# Patient Record
Sex: Male | Born: 1935 | Race: Black or African American | Hispanic: No | Smoking: Current some day smoker
Health system: Southern US, Community
[De-identification: ages and names within clinical notes are randomized; demographics above are authoritative.]

## PROBLEM LIST (undated history)

## (undated) DIAGNOSIS — D473 Essential (hemorrhagic) thrombocythemia: Secondary | ICD-10-CM

## (undated) DIAGNOSIS — K219 Gastro-esophageal reflux disease without esophagitis: Secondary | ICD-10-CM

## (undated) DIAGNOSIS — K469 Unspecified abdominal hernia without obstruction or gangrene: Secondary | ICD-10-CM

## (undated) DIAGNOSIS — E039 Hypothyroidism, unspecified: Secondary | ICD-10-CM

## (undated) DIAGNOSIS — C61 Malignant neoplasm of prostate: Secondary | ICD-10-CM

## (undated) DIAGNOSIS — E785 Hyperlipidemia, unspecified: Secondary | ICD-10-CM

## (undated) DIAGNOSIS — I10 Essential (primary) hypertension: Secondary | ICD-10-CM

## (undated) DIAGNOSIS — M549 Dorsalgia, unspecified: Secondary | ICD-10-CM

## (undated) HISTORY — DX: Hyperlipidemia, unspecified: E78.5

## (undated) HISTORY — DX: Unspecified abdominal hernia without obstruction or gangrene: K46.9

## (undated) HISTORY — DX: Gastro-esophageal reflux disease without esophagitis: K21.9

## (undated) HISTORY — PX: PROSTATE SURGERY: SHX751

## (undated) HISTORY — DX: Essential (primary) hypertension: I10

## (undated) HISTORY — DX: Dorsalgia, unspecified: M54.9

## (undated) HISTORY — PX: OTHER SURGICAL HISTORY: SHX169

## (undated) HISTORY — DX: Malignant neoplasm of prostate: C61

## (undated) HISTORY — DX: Essential (hemorrhagic) thrombocythemia: D47.3

## (undated) HISTORY — PX: BACK SURGERY: SHX140

## (undated) HISTORY — PX: HERNIA REPAIR: SHX51

## (undated) HISTORY — DX: Hypothyroidism, unspecified: E03.9

## (undated) HISTORY — PX: SHOULDER SURGERY: SHX246

## (undated) NOTE — *Deleted (*Deleted)
[facility]   Telephone:([phone] Fax:([phone]) [phone]   Clinic Follow up Note   Patient Care Team: Georgann Housekeeper, MD as PCP - General (Internal Medicine) Exie Parody, MD (Hematology and Oncology) Charolett Bumpers, MD as Attending Physician (Gastroenterology) Barron Alvine, MD as Attending Physician (Urology)  Date of Service:  04/17/2020  CHIEF COMPLAINT: Follow up on essential thrombocytosis   PREVIOUS THERAPY:Hydrea 500 mg daily, from 08/2012 to 04/01/2015, stopped on 04/02/2015 due to recurrent episodes of dizziness, restarted on 06/08/2015. He tried anagrelide 0.5mg  bid for one month in 05/2015 which was not effective. Startled Hydrea 500 mg Monday-Saturday, hold on sundays due to anemia on 07/19/17, and decreased to 500mg  daily except Wednesday and Sunday on 12/06/2017, increased back to 500mg  daily except on Sundays in 01/2018. Dose further increased subsequently  CURRENT THERAPY: Increased hydrea to 1000 mg MWF and 500 mg other days due to plt count >500kin July 2020  INTERVAL HISTORY: *** Ricky Santos is here for a follow up of ET. He was last seen by me 6 months ago. He presents to the clinic alone.    REVIEW OF SYSTEMS:  *** Constitutional: Denies fevers, chills or abnormal weight loss Eyes: Denies blurriness of vision Ears, nose, mouth, throat, and face: Denies mucositis or sore throat Respiratory: Denies cough, dyspnea or wheezes Cardiovascular: Denies palpitation, chest discomfort or lower extremity swelling Gastrointestinal:  Denies nausea, heartburn or change in bowel habits Skin: Denies abnormal skin rashes Lymphatics: Denies new lymphadenopathy or easy bruising Neurological:Denies numbness, tingling or new weaknesses Behavioral/Psych: Mood is stable, no new changes  All other systems were reviewed with the patient and are negative.  MEDICAL HISTORY:  Past Medical History:  Diagnosis Date  . Back pain   . Essential thrombocytosis (HCC)  07/2012   JAK-2 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   . Essential thrombocytosis (HCC) 08/26/2012  . GERD (gastroesophageal reflux disease)   . Hernia   . Hyperlipidemia   . Hypertension   . Hypothyroid   . Prostate cancer [facility])     SURGICAL HISTORY: Past Surgical History:  Procedure Laterality Date  . BACK SURGERY    . COLONOSCOPY  2003  . COLONOSCOPY N/A 01/17/2013   Procedure: COLONOSCOPY;  Surgeon: Charolett Bumpers, MD;  Location: [facility];  Service: Endoscopy;  Laterality: N/A;  . exp lap for trauma    . HERNIA REPAIR    . INCISION AND DRAINAGE ABSCESS ANAL  08/01/05  . PROSTATE SURGERY    . SHOULDER SURGERY     rt    I have reviewed the social history and family history with the patient and they are unchanged from previous note.  ALLERGIES:  has No Known Allergies.  MEDICATIONS:  Current Outpatient Medications  Medication Sig Dispense Refill  . aspirin 81 MG tablet Take 81 mg by mouth daily.      Marland Kitchen atorvastatin (LIPITOR) 40 MG tablet Take 40 mg by mouth daily.      Marland Kitchen diltiazem (CARDIZEM CD) 240 MG 24 hr capsule Take 240 mg by mouth daily.      . finasteride (PROSCAR) 5 MG tablet Take 5 mg by mouth daily.      . hydrochlorothiazide 25 MG tablet Take 25 mg by mouth daily.      . hydroxyurea (HYDREA) 500 MG capsule Take 1 capsule (500 mg total) by mouth daily. Take 2 capsules daily on Mon, Wed, Fri, and 1 capsule daily for the rest of week 130 capsule 1  .  levothyroxine (SYNTHROID, LEVOTHROID) 25 MCG tablet Take 25 mcg by mouth daily.      Marland Kitchen lisinopril (PRINIVIL,ZESTRIL) 20 MG tablet Take 20 mg by mouth daily.  6  . meclizine (ANTIVERT) 25 MG tablet Take 25 mg by mouth as needed. Reported on 11/11/2015  3  . mirtazapine (REMERON) 15 MG tablet Take 15 mg by mouth at bedtime.     . pantoprazole (PROTONIX) 40 MG tablet Take 40 mg by mouth daily.      . psyllium (METAMUCIL) 58.6 % powder Take 1 packet by mouth 3 (three) times daily. Reported on 11/11/2015     No  current facility-administered medications for this visit.    PHYSICAL EXAMINATION: ECOG PERFORMANCE STATUS: {CHL ONC ECOG [phone]}  There were no vitals filed for this visit. There were no vitals filed for this visit. *** GENERAL:alert, no distress and comfortable SKIN: skin color, texture, turgor are normal, no rashes or significant lesions EYES: normal, Conjunctiva are pink and non-injected, sclera clear {OROPHARYNX:no exudate, no erythema and lips, buccal mucosa, and tongue normal}  NECK: supple, thyroid normal size, non-tender, without nodularity LYMPH:  no palpable lymphadenopathy in the cervical, axillary {or inguinal} LUNGS: clear to auscultation and percussion with normal breathing effort HEART: regular rate & rhythm and no murmurs and no lower extremity edema ABDOMEN:abdomen soft, non-tender and normal bowel sounds Musculoskeletal:no cyanosis of digits and no clubbing  NEURO: alert & oriented x 3 with fluent speech, no focal motor/sensory deficits  LABORATORY DATA:  I have reviewed the data as listed CBC Latest Ref Rng & Units 01/18/2020 10/18/2019 07/21/2019  WBC 4.0 - 10.5 K/uL 5.5 5.4 5.8  Hemoglobin 13.0 - 17.0 g/dL 16.1 09.6 04.5  Hematocrit 39 - 52 % 42.7 39.3 41.4  Platelets 150 - 400 K/uL 411(H) 431(H) 465(H)     CMP Latest Ref Rng & Units 01/18/2020 10/18/2019 07/21/2019  Glucose 70 - 99 mg/dL 409(W) 98 84  BUN 8 - 23 mg/dL 13 14 15   Creatinine 0.61 - 1.24 mg/dL 1.19(J) 4.78(G) 9.56  Sodium 135 - 145 mmol/L 139 136 138  Potassium 3.5 - 5.1 mmol/L 4.1 4.1 3.8  Chloride 98 - 111 mmol/L 103 103 103  CO2 22 - 32 mmol/L 24 22 25   Calcium 8.9 - 10.3 mg/dL 10.5(H) 9.6 9.9  Total Protein 6.5 - 8.1 g/dL 7.5 7.4 7.9  Total Bilirubin 0.3 - 1.2 mg/dL 0.5 0.6 0.5  Alkaline Phos 38 - 126 U/L 70 60 69  AST 15 - 41 U/L 25 23 20   ALT 0 - 44 U/L 13 10 11       RADIOGRAPHIC STUDIES: I have personally reviewed the radiological images as listed and agreed with the  findings in the report. No results found.   ASSESSMENT & PLAN:  Ricky Santos is a 60 y.o. male with   1. Essential Thrombocytosis. JAK2(+) -He was diagnosed in March 2014, initial platelet count in the 500k range, JAK2 mutation (+), he did not have bone marrow biopsy.  -We previously discussed that this is a myeloproliferative disorder, people usually do very well, and there is small percentage pts will develop myelofibrosis and AML late on. -He is currently on Hydrea to 1000 mg MWF and 500 mg other days. He is tolerating well.  -He is clinically doing well. Labs reviewed, CBC and CMP WNL except plt 431K, Cr 1.31. Iron panel still pending.  -Continue Hydrea at same dose.  -will repeat lab in 3 months and f/u in 6 months  2. Anemia of iron deficiency  -Etiology of iron deficiency remains unclear.HisEGD andcolonoscopy with Dr. Curt Jews 03/2018 were benign -Continue oral ferrous sulfate once daily  -anemiaand iron panel resolved in 07/2019. Today's panel still pending.   3. MGUS -M spike 0.2 with IgG monoclonal protein with lambda light chain specificity on 12/06/17.  -Cr on 10/11/17 was elevated to 1.34, normal previously; hasnormalizedon 02/14/18 -01/24/18 bone survey is negative for lytic lesions -Last M-Protein in6/2020 was not observed.WillcontinuemonitoringMM panel every 6 months.  4. Intermittent dizziness -unlikely related to hydrea -He previously participated a course of inner ear balance PT without any success. He will follow-up with Dr. Donette Larry -Stable with no incident of falls.  -Has resolved now.    Plan -He is clinically doing well and stable  -Continue Hydrea to1000mg  dailyon Mondays and 500mg daily forthe rest of the week, refilled today -Lab in3 months -Lab and f/u in25months   No problem-specific Assessment & Plan notes found for this encounter.   No orders of the defined types were placed in this encounter.  All questions were  answered. The patient knows to call the clinic with any problems, questions or concerns. No barriers to learning was detected. The total time spent in the appointment was {CHL ONC TIME VISIT - NFAOZ:[id number]}.     Delphina Cahill 04/17/2020   Rogelia Rohrer, am acting as scribe for Malachy Mood, MD.   {Add scribe attestation statement}

---

## 1997-12-10 ENCOUNTER — Ambulatory Visit (HOSPITAL_COMMUNITY): Admission: RE | Admit: 1997-12-10 | Discharge: 1997-12-10 | Payer: Self-pay | Admitting: Neurosurgery

## 1997-12-24 ENCOUNTER — Ambulatory Visit (HOSPITAL_COMMUNITY): Admission: RE | Admit: 1997-12-24 | Discharge: 1997-12-24 | Payer: Self-pay | Admitting: Neurosurgery

## 1998-01-07 ENCOUNTER — Ambulatory Visit (HOSPITAL_COMMUNITY): Admission: RE | Admit: 1998-01-07 | Discharge: 1998-01-07 | Payer: Self-pay | Admitting: Neurosurgery

## 2001-04-26 ENCOUNTER — Encounter: Payer: Self-pay | Admitting: Internal Medicine

## 2001-04-26 ENCOUNTER — Encounter: Admission: RE | Admit: 2001-04-26 | Discharge: 2001-04-26 | Payer: Self-pay | Admitting: Internal Medicine

## 2001-06-01 HISTORY — PX: COLONOSCOPY: SHX174

## 2002-02-02 ENCOUNTER — Encounter: Admission: RE | Admit: 2002-02-02 | Discharge: 2002-02-02 | Payer: Self-pay | Admitting: Internal Medicine

## 2002-02-02 ENCOUNTER — Encounter: Payer: Self-pay | Admitting: Internal Medicine

## 2002-02-14 ENCOUNTER — Encounter: Payer: Self-pay | Admitting: Internal Medicine

## 2002-02-14 ENCOUNTER — Encounter (INDEPENDENT_AMBULATORY_CARE_PROVIDER_SITE_OTHER): Payer: Self-pay | Admitting: *Deleted

## 2002-02-14 ENCOUNTER — Ambulatory Visit (HOSPITAL_COMMUNITY): Admission: RE | Admit: 2002-02-14 | Discharge: 2002-02-14 | Payer: Self-pay | Admitting: Internal Medicine

## 2002-07-31 ENCOUNTER — Ambulatory Visit (HOSPITAL_COMMUNITY): Admission: RE | Admit: 2002-07-31 | Discharge: 2002-07-31 | Payer: Self-pay | Admitting: Gastroenterology

## 2003-06-23 ENCOUNTER — Encounter: Admission: RE | Admit: 2003-06-23 | Discharge: 2003-06-23 | Payer: Self-pay | Admitting: Internal Medicine

## 2005-02-13 ENCOUNTER — Emergency Department (HOSPITAL_COMMUNITY): Admission: EM | Admit: 2005-02-13 | Discharge: 2005-02-13 | Payer: Self-pay | Admitting: Emergency Medicine

## 2005-06-08 ENCOUNTER — Ambulatory Visit (HOSPITAL_BASED_OUTPATIENT_CLINIC_OR_DEPARTMENT_OTHER): Admission: RE | Admit: 2005-06-08 | Discharge: 2005-06-08 | Payer: Self-pay | Admitting: Orthopedic Surgery

## 2005-08-01 ENCOUNTER — Emergency Department (HOSPITAL_COMMUNITY): Admission: EM | Admit: 2005-08-01 | Discharge: 2005-08-01 | Payer: Self-pay | Admitting: Emergency Medicine

## 2005-08-01 HISTORY — PX: INCISION AND DRAINAGE ABSCESS ANAL: SUR669

## 2005-10-19 ENCOUNTER — Encounter: Admission: RE | Admit: 2005-10-19 | Discharge: 2005-10-19 | Payer: Self-pay | Admitting: Internal Medicine

## 2006-08-13 ENCOUNTER — Encounter: Admission: RE | Admit: 2006-08-13 | Discharge: 2006-08-13 | Payer: Self-pay | Admitting: Urology

## 2006-08-16 ENCOUNTER — Ambulatory Visit (HOSPITAL_BASED_OUTPATIENT_CLINIC_OR_DEPARTMENT_OTHER): Admission: RE | Admit: 2006-08-16 | Discharge: 2006-08-16 | Payer: Self-pay | Admitting: Urology

## 2006-08-16 ENCOUNTER — Encounter (INDEPENDENT_AMBULATORY_CARE_PROVIDER_SITE_OTHER): Payer: Self-pay | Admitting: *Deleted

## 2006-09-19 ENCOUNTER — Emergency Department (HOSPITAL_COMMUNITY): Admission: EM | Admit: 2006-09-19 | Discharge: 2006-09-19 | Payer: Self-pay | Admitting: *Deleted

## 2006-10-26 ENCOUNTER — Encounter: Admission: RE | Admit: 2006-10-26 | Discharge: 2006-10-26 | Payer: Self-pay | Admitting: Internal Medicine

## 2007-02-18 ENCOUNTER — Emergency Department (HOSPITAL_COMMUNITY): Admission: EM | Admit: 2007-02-18 | Discharge: 2007-02-18 | Payer: Self-pay | Admitting: Emergency Medicine

## 2007-11-02 ENCOUNTER — Encounter: Admission: RE | Admit: 2007-11-02 | Discharge: 2007-11-02 | Payer: Self-pay | Admitting: Internal Medicine

## 2008-11-27 ENCOUNTER — Encounter: Admission: RE | Admit: 2008-11-27 | Discharge: 2008-11-27 | Payer: Self-pay | Admitting: Gastroenterology

## 2008-11-29 ENCOUNTER — Emergency Department (HOSPITAL_COMMUNITY): Admission: EM | Admit: 2008-11-29 | Discharge: 2008-11-29 | Payer: Self-pay | Admitting: Emergency Medicine

## 2008-12-01 ENCOUNTER — Emergency Department (HOSPITAL_COMMUNITY): Admission: EM | Admit: 2008-12-01 | Discharge: 2008-12-01 | Payer: Self-pay | Admitting: Emergency Medicine

## 2008-12-03 ENCOUNTER — Emergency Department (HOSPITAL_COMMUNITY): Admission: EM | Admit: 2008-12-03 | Discharge: 2008-12-03 | Payer: Self-pay | Admitting: Emergency Medicine

## 2010-06-22 ENCOUNTER — Encounter: Payer: Self-pay | Admitting: Internal Medicine

## 2010-10-17 NOTE — Op Note (Signed)
NAMEDEMARIO, Ricky Santos NO.:  [id number]   MEDICAL RECORD NO.:  [id number]          PATIENT TYPE:  EMS   LOCATION:  ED                           FACILITY:  [facility]   PHYSICIAN:  Sandria Bales. Ezzard Standing, M.D.  DATE OF BIRTH:  01-Sep-1933   DATE OF PROCEDURE:  02/13/2005  DATE OF DISCHARGE:                                 OPERATIVE REPORT   PREOPERATIVE DIAGNOSIS:  Right perianal abscess.   POSTOPERATIVE DIAGNOSIS:  Right perianal abscess.   PROCEDURE:  Incision and drainage of perianal abscess.   ANESTHESIA:  Approximately 15 mL of 1% Xylocaine.   COMPLICATIONS:  None.   INDICATIONS FOR PROCEDURE:  Mr. Bostock is a 77 year old black gentleman who  first came to Dr. Earl Lites at [facility] and then presented to  [facility] with a right perianal abscess which has been  brewing for about a week.  I planned to incise and drain this in the  emergency room.   OPERATIVE NOTE:  The patient is in a right lateral decubitus position.  His  right buttocks was prepped with Betadine solution and sterilely draped.  I  infiltrated the skin with approximately 15 mL of 0.25% Xylocaine.  I then  made a linear incision into an abscess.  I made the incision about 3 cm  long, cut out about a 2-3 cm core of skin to better let this drain.  I then  packed it with gauze, sterilely dressed it.   I gave him Vicodin for pain, Septra DS one week for antibiotic.  He will be  seen back in 7-10 days for follow up.      Sandria Bales. Ezzard Standing, M.D.  Electronically Signed     DHN/MEDQ  D:  02/13/2005  T:  02/13/2005  Job:  [id number]   cc:   Georgann Housekeeper, MD  [address]  [place]  [place] [address]  Fax: [phone]   Stan Head. Cleta Alberts, M.D.  [address]  [place]  [place] [address]  Fax: [phone]

## 2010-10-17 NOTE — Op Note (Signed)
NAME:  Ricky Santos, Ricky Santos NO.:  [id number]   MEDICAL RECORD NO.:  [id number]          PATIENT TYPE:  AMB   LOCATION:  [facility]                         FACILITY:  [facility]   PHYSICIAN:  Valetta Fuller, M.D.  DATE OF BIRTH:  10-03-1933   DATE OF PROCEDURE:  08/16/2006  DATE OF DISCHARGE:                               OPERATIVE REPORT   PREOPERATIVE DIAGNOSES:  1. Elevated PSA.  2. Bladder neck obstruction and BPH.   POSTOPERATIVE DIAGNOSES:  1. Elevated PSA.  2. Bladder neck obstruction and BPH.   PROCEDURES PERFORMED:  1. Transrectal ultrasound of the prostate with ultrasound-guided      biopsy.  2. Transurethral incision of the prostate.   SURGEON:  Valetta Fuller, M.D.   ANESTHESIA:  General.   INDICATIONS:  The patient is a 77 year old male.  He has been seen in  our office for longstanding bladder neck obstruction and BPH.  He was on  alpha blocker therapy for the last 3-4 years, and he had an immediate  good response to that.  Over time it has lost the effectiveness, and he  has become more and more symptomatic with regard to his bladder neck  obstruction.  The patient has had progressive problems with nocturia,  with increased hesitancy, a weak and intermittent stream. Approximately  one year ago the patient's PSA was normal at 2.9.  The last time he was  in the office his urine was clear without evidence of infection.  Cystoscopy showed mild lateral lobe hyperplasia with a fairly prominent  and high-riding median bar.  Peak flow was 4 mL per second, with a mean  flow of only 3 mL per second.  We talked to the patient about other  options, and he elected to undergo a transurethral incision of the  prostate.  He appeared to understand the advantages and disadvantages of  that.  As part of his assessment, we also repeated his PSA -- which was  elevated at 12.  Taking that he might have some mild chronic smoldering  prostatitis, he was treated with an empiric  course of antibiotics, and  his PSA was repeated recently but was still significantly elevated at  10.  For that reason we felt it would be reasonable to go ahead and do a  prostate biopsy at the same time we were doing the transurethral  incision.  We did not feel that the transurethral incision of the  prostate would in anyway impact necessarily any prostate cancer  treatment that might be necessary down the road, and may actually  facilitate treatment if it is necessary.  The patient appeared to  understand the advantages and disadvantages of all these procedures.  An  informed consent was obtained.   TECHNIQUES AND FINDINGSTHE:  The patient was brought to the operating  room, where he had successful induction of general anesthesia.  He was  placed in the lithotomy position and prepped and draped in the usual  manner.  Attention was first turned towards ultrasound and biopsy of his  prostate.  Transrectal ultrasound was done and  representative sagittal  transverse images were taken and saved.  Prostate appeared relatively  symmetric with unremarkable seminal vesicles.  Prostate volume was  estimated at 50 grams.  We ended up doing 6 biopsies of both the right  and left lobes of the prostate, and these biopsies were sent separately.   Attention was then turned towards transurethral incision of the  prostate.  We used [employer] sounds to 30-French, and then placed a 28-  [place] continuous resectoscope sheath in the bladder.  The bladder  itself showed no other pathology, and had been inspected carefully with  flexible cystoscopy.  He did have some mild thickening and trabecular  change.  Again, there were some lateral lobes, but primarily a high-  riding median bar.  Utilizing a Collings knife, we performed a  transurethral incision of the prostate from the trigone between the  ureteral orifices, back through the bladder neck and median bar of the  prostate, to the level of the  verumontanum.  This resulted in extremely  good opening and improvement in visual obstruction.  Hemostasis was  quite good.  We then used some lidocaine jelly, and placed a 22-French  Foley catheter with a 30 mL balloon.  Urine remained clear.  The patient  appeared to tolerate the procedure well without difficulties or  problems.  He was brought to the recovery room in stable condition.           ______________________________  Valetta Fuller, M.D.  Electronically Signed     DSG/MEDQ  D:  08/16/2006  T:  08/16/2006  Job:  [id number]

## 2010-10-17 NOTE — Consult Note (Signed)
NAMEXHAIDEN, COOMBS NO.:  [id number]   MEDICAL RECORD NO.:  [id number]          PATIENT TYPE:  EMS   LOCATION:  ED                           FACILITY:  [facility]   PHYSICIAN:  Ricky Santos, M.D.  DATE OF BIRTH:  Mar 10, 1934   DATE OF CONSULTATION:  02/13/2005  DATE OF DISCHARGE:                                   CONSULTATION   HISTORY OF ILLNESS:  [name] is a 77 year old black gentleman, who is a  patient of Dr. Georgann Santos, who has developed increasing right  perirectal/perianal pain.  This has been going on over about a week.  He  thought this would take care of itself and drain at home, but it got bad  after [facility], though his primary medical  doctor is Dr. Georgann Santos.   PAST MEDICAL HISTORY:  He has no allergies.   He is on no medication.   REVIEW OF SYSTEMS:  He has no history of pulmonary disease, coronary artery  disease, gastrointestinal disease, or urologic disease.   He is retired, now works as a Agricultural consultant for [employer], etc.   His wife is in the emergency room with him.   PHYSICAL EXAMINATION:  VITAL SIGNS:  His temperature is 98.7; pulse is 90;  respirations are 20.  GENERAL:  He is a well-nourished, average-build black male.  NECK:  His neck is supple.  I find no mass or thyromegaly.  LUNGS:  Clear to auscultation.  HEART:  Regular rate and rhythm without murmur or rub.  ABDOMEN:  Soft.   In the right lateral decubitus position, he has a bulge and swelling of his  right buttocks, sort of posterior to the anus, consistent with a perianal  abscess.   DIAGNOSIS:  Right perianal abscess.  We will plan incision and drainage in  the emergency room under local anesthesia.  He is otherwise a healthy male.  I will give him some Vicodin for pain to go home with.  I will give him a 1  week supply of Septra DS to take after this, and he will see me back in 10-  14 days for  follow up.      Ricky Santos, M.D.  Electronically Signed     DHN/MEDQ  D:  02/13/2005  T:  02/13/2005  Job:  [id number]   cc:   Ricky Housekeeper, MD  [address]  [place]  [place] [address]  Fax: [phone]   Ricky Santos. Ricky Santos, M.D.  [address]  [place]  [place] [address]  Fax: [phone]

## 2010-10-17 NOTE — Op Note (Signed)
   NAME:  Ricky Santos, Ricky Santos NO.:  [id number]   MEDICAL RECORD NO.:  [id number]                   PATIENT TYPE:  AMB   LOCATION:  ENDO                                 FACILITY:  [facility]   PHYSICIAN:  Danise Edge, M.D.                DATE OF BIRTH:  1933-10-09   DATE OF PROCEDURE:  07/31/2002  DATE OF DISCHARGE:                                 OPERATIVE REPORT   PROCEDURE:  Screening colonoscopy.   INDICATIONS:  The patient is a 77 year old male born [birth year].  The  patient has colonic diverticulosis, by barium enema x-ray a few years ago,  and also has chronic constipation.  He is scheduled to undergo his first  screening colonoscopy with polypectomy to prevent colon cancer.   ENDOSCOPIST:  Charolett Bumpers, M.D.   PREMEDICATION:  1. Demerol 50 mg.  2. Versed 5 mg.   ENDOSCOPE:  Olympus adult colonoscope.   DESCRIPTION OF PROCEDURE:  After obtaining informed consent, the patient was  placed in the left lateral decubitus position.  I administered intravenous  Demerol and intravenous Versed to achieve conscious sedation for the  procedure.  The patient's blood pressure, oxygen saturation and cardiac  rhythm were monitored throughout the procedure and documented in the medical  record.   Anal inspection was normal.  Digital rectal examination was normal.  The  Olympus adult video colonoscope was introduced into the rectum and easily  advanced to the cecum.  Colonic preparation for the examination today was  excellent.   FINDINGS:  RECTUM:  Normal.  SIGMOID/DESCENDING COLON:  Small diverticula are noted in the left colon,  without diverticulitis or diverticular structural formation.  SPLENIC FLEXURE:  Normal.  TRANSVERSE COLON:  Normal.  HEPATIC FLEXURE:  Normal.  ASCENDING:  Normal.  CECUM AND ILEOCECAL VALVE:  Normal.    ASSESSMENT:  1. Left colonic diverticulosis; otherwise normal proctocolonoscopy to the     cecum.  2. No endoscopic  evidence for the presence of colorectal neoplasia.                                               Danise Edge, M.D.    MJ/MEDQ  D:  07/31/2002  T:  07/31/2002  Job:  [id number]   cc:   Georgann Housekeeper, M.D.  [address]  [place]  [place] [address]  Fax: [phone]

## 2010-10-17 NOTE — Op Note (Signed)
NAMEDEMONIE, Ricky Santos NO.:  [id number]   MEDICAL RECORD NO.:  [id number]          PATIENT TYPE:  AMB   LOCATION:  DSC                          FACILITY:  [facility]   PHYSICIAN:  Feliberto Gottron. Turner Daniels, M.D.   DATE OF BIRTH:  [birth year]   DATE OF PROCEDURE:  06/08/2005  DATE OF DISCHARGE:  02/13/2005                                 OPERATIVE REPORT   PREOPERATIVE DIAGNOSIS:  Left shoulder impingement syndrome with probable  rotator cuff tear.   POSTOPERATIVE DIAGNOSIS:  Left shoulder impingement syndrome with probable  rotator cuff tear, biceps tendon tear.   PROCEDURE:  Left shoulder arthroscopic anterior-inferior acromioplasty,  debridement of massive supraspinatus rotator cuff tear and debridement of  remnants of  biceps tendon.   SURGEON:  Feliberto Gottron. Turner Daniels, MD   FIRST ASSISTANT:  Skip Mayer, PA-C.   ANESTHETIC:  Left interscalene block plus general endotracheal.   ESTIMATED BLOOD LOSS:  Minimal.   FLUID REPLACEMENT:  800 mL crystalloid.   DRAINS PLACED:  None.   TOURNIQUET TIME:  None.   INDICATIONS FOR PROCEDURE:  The patient is a 77 year old gentleman who has  been treated appropriately and conservatively for bilateral impingement  syndrome with anti-inflammatory medicines, observation, Thera-Band exercises  and had good temporary relief from cortisone injection, actually got fairly  dramatic relief from a cortisone shot last year, that lasted for many many  months. Unfortunately, his symptoms returned.  He went bilateral cortisone  injections back in October.  The right one more the left unfortunately has  persistent significant pain in his left shoulder. Plain radiographs were  consistent with a decreased subacromial interval and the subacromial spur  and he is taken for arthroscopic decompression and debridement of presumed  rotator cuff tear and any other pathology that might be found. Risks,  benefits discussed prior to surgery questions answered.   DESCRIPTION OF PROCEDURE:  The patient identified by armband and underwent  interscalene block anesthesia with appropriate monitoring in the preop area  of the day surgery center. He was then taken back to the operative suite and  underwent general endotracheal anesthesia in the supine position, was then  placed in the beach chair position and the left upper extremity prepped and  draped in usual sterile fashion from the wrist of the hemithorax.  Using a  #11 blade, standard portals were then made 1.5 cm anterior to the [facility] joint  lateral to the junction of middle and posterior thirds of the acromion,  posterior and posterolateral part of the acromion process. The inflow was  placed anteriorly, the arthroscope laterally, a 4.2 great white sucker  shaver posteriorly.  We immediately identified a large supraspinatus rotator  cuff tear that appeared to be chronic in nature as well as about a 95%  biceps tear that also appeared to be chronic in nature. These were both  debrided back to stable margins and the biceps tendon was noted to retract  after it had been sectioned.  The rotator cuff only got back to a stable  margin was at the glenoid rim along the supraspinatus.  We performed a  greater tuboplasty smoothed out the greater tuberosity and also resected a  sharp type 3 subacromial spur with a 4.5 mm hooded Vortex bur. The [facility] joint  was in relatively good condition. The leading and trailing edges of the  rotator cuff tear were smoothed back to an even margin.  The scope was taken  into the glenohumeral joint through the defect in the rotator cuff where the  labrum was noted to be intact and there was only a central grade 2 to grade  3 chondromalacia of the glenoid that was lightly debrided.  At this point  the shoulder was irrigated out with normal saline solution. The arthroscopic  instruments were removed.  A dressing of Xeroform, 4x4 dressing sponges,  paper tape and a sling applied. The  patient was then laid supine, awakened  and taken to the recovery room without difficulty.      Feliberto Gottron. Turner Daniels, M.D.  Electronically Signed     FJR/MEDQ  D:  06/08/2005  T:  06/09/2005  Job:  [id number]

## 2010-11-24 ENCOUNTER — Encounter (INDEPENDENT_AMBULATORY_CARE_PROVIDER_SITE_OTHER): Payer: Self-pay | Admitting: Surgery

## 2010-11-28 ENCOUNTER — Encounter (INDEPENDENT_AMBULATORY_CARE_PROVIDER_SITE_OTHER): Payer: Self-pay | Admitting: Surgery

## 2010-11-28 ENCOUNTER — Ambulatory Visit (INDEPENDENT_AMBULATORY_CARE_PROVIDER_SITE_OTHER): Payer: Medicare Other | Admitting: Surgery

## 2010-11-28 VITALS — BP 130/82 | HR 77 | Temp 98.2°F | Ht 69.0 in | Wt 140.0 lb

## 2010-11-28 DIAGNOSIS — K4090 Unilateral inguinal hernia, without obstruction or gangrene, not specified as recurrent: Secondary | ICD-10-CM

## 2010-11-28 NOTE — Progress Notes (Signed)
Subjective:     Patient ID: Ricky Santos, male   DOB: [birth year], 77 y.o.   MRN: [id number]    BP 130/82  Pulse 77  Temp(Src) 98.2 F (36.8 C) (Temporal)  Ht 5\' 9"  (1.753 m)  Wt 140 lb (63.504 kg)  BMI 20.67 kg/m2    HPI This is a 78 year old gentleman referred by Dr. Eula Listen for a bulge in his right groin. He noticed this approximately 10 days ago. It causes him minimal discomfort. It easily reduces. He does do a lot of heavy lifting. He has had no obstructive symptoms.  Review of Systems  Constitutional: Negative.   HENT: Negative.   Eyes: Negative.   Respiratory: Negative.   Cardiovascular: Negative.   Gastrointestinal: Negative.   Genitourinary: Negative.   Musculoskeletal: Negative.   Neurological: Negative.   Hematological: Negative.   Psychiatric/Behavioral: Negative.        Objective:   Physical Exam  Constitutional: He is oriented to person, place, and time. He appears well-developed and well-nourished.  HENT:  Head: Normocephalic and atraumatic.  Nose: Nose normal.  Mouth/Throat: Oropharynx is clear and moist.  Eyes: Conjunctivae are normal. Pupils are equal, round, and reactive to light.  Neck: Neck supple. No tracheal deviation present. Thyromegaly present.  Cardiovascular: Normal rate, regular rhythm and normal heart sounds.   No murmur heard. Pulmonary/Chest: Effort normal and breath sounds normal. No respiratory distress.  Abdominal: Soft. Bowel sounds are normal. He exhibits no distension. There is no tenderness. There is no rebound. A hernia is present. Hernia confirmed positive in the right inguinal area.  Musculoskeletal: Normal range of motion. He exhibits no edema and no tenderness.  Neurological: He is alert and oriented to person, place, and time.  Skin: Skin is warm and dry.       Assessment:    Right Inguinal Hernia    Plan:     I recommend right inguinal hernia repair with mesh. I discussed the risk of the surgery with him in detail.  These risks include but are not limited to bleeding, infection, injury to surrounding structures, nerve entrapment, chronic pain, recurrence, use of mesh. He understands and wishes to proceed. Surgery will be scheduled.

## 2010-12-05 ENCOUNTER — Encounter (HOSPITAL_BASED_OUTPATIENT_CLINIC_OR_DEPARTMENT_OTHER)
Admission: RE | Admit: 2010-12-05 | Discharge: 2010-12-05 | Disposition: A | Discharge status: Home / Self Care | Payer: Medicare Other | Source: Ambulatory Visit | Attending: Surgery | Admitting: Surgery

## 2010-12-05 ENCOUNTER — Other Ambulatory Visit (INDEPENDENT_AMBULATORY_CARE_PROVIDER_SITE_OTHER): Payer: Self-pay | Admitting: Surgery

## 2010-12-05 ENCOUNTER — Ambulatory Visit
Admission: RE | Admit: 2010-12-05 | Discharge: 2010-12-05 | Disposition: A | Discharge status: Home / Self Care | Payer: Medicare Other | Source: Ambulatory Visit | Attending: Surgery | Admitting: Surgery

## 2010-12-05 DIAGNOSIS — Z01811 Encounter for preprocedural respiratory examination: Secondary | ICD-10-CM

## 2010-12-05 LAB — POCT I-STAT, CHEM 8
Calcium, Ion: 1.1 mmol/L — ABNORMAL LOW (ref 1.12–1.32)
Chloride: 107 mEq/L (ref 96–112)
Creatinine, Ser: 0.8 mg/dL (ref 0.50–1.35)
Glucose, Bld: 110 mg/dL — ABNORMAL HIGH (ref 70–99)
HCT: 43 % (ref 39.0–52.0)

## 2010-12-08 ENCOUNTER — Ambulatory Visit (HOSPITAL_BASED_OUTPATIENT_CLINIC_OR_DEPARTMENT_OTHER)
Admission: RE | Admit: 2010-12-08 | Discharge: 2010-12-08 | Disposition: A | Discharge status: Home / Self Care | Payer: Medicare Other | Source: Ambulatory Visit | Attending: Surgery | Admitting: Surgery

## 2010-12-08 DIAGNOSIS — K4090 Unilateral inguinal hernia, without obstruction or gangrene, not specified as recurrent: Secondary | ICD-10-CM

## 2010-12-08 DIAGNOSIS — Z01810 Encounter for preprocedural cardiovascular examination: Secondary | ICD-10-CM | POA: Insufficient documentation

## 2010-12-08 DIAGNOSIS — Z79899 Other long term (current) drug therapy: Secondary | ICD-10-CM | POA: Insufficient documentation

## 2010-12-08 DIAGNOSIS — K219 Gastro-esophageal reflux disease without esophagitis: Secondary | ICD-10-CM | POA: Insufficient documentation

## 2010-12-08 DIAGNOSIS — Z01812 Encounter for preprocedural laboratory examination: Secondary | ICD-10-CM | POA: Insufficient documentation

## 2010-12-08 DIAGNOSIS — I10 Essential (primary) hypertension: Secondary | ICD-10-CM | POA: Insufficient documentation

## 2010-12-11 NOTE — Op Note (Signed)
  NAME:  FITZ, MATSUO NO.:  [id number]  MEDICAL RECORD NO.:  [id number]  LOCATION:                                 FACILITY:  PHYSICIAN:  Abigail Miyamoto, M.D.      DATE OF BIRTH:  DATE OF PROCEDURE:  12/08/2010 DATE OF DISCHARGE:                              OPERATIVE REPORT   PREOPERATIVE DIAGNOSIS:  Right inguinal hernia.  POSTOPERATIVE DIAGNOSIS:  Right inguinal hernia.  PROCEDURE:  Right inguinal hernia repair with mesh.  SURGEON:  Abigail Miyamoto, MD  ANESTHESIA:  General Marcaine block and ropivacaine block, and 1% lidocaine.  ESTIMATED BLOOD LOSS:  Minimal.  FINDINGS:  Indirect inguinal hernia.  PROCEDURE IN DETAIL:  The patient was identified as Ricky Santos, under ultrasound guidance anesthesia performed.  An ilioinguinal nerve block with ropivacaine.  The patient was then brought in a stable condition to the operating room and general anesthesia was induced.  His right groin was then prepped and draped in usual sterile fashion.  The skin was then anesthetized with 1% lidocaine.  I made a longitudinal incision with a #10 blade, I took this down through Scarpa's fascia with electrocautery. The external oblique fascia was identified and then opened further through the internal and external rings.  Testicular cord structures were easily identified and controlled with the Penrose drain.  The patient had minimally weak inguinal floor.  He had a very small indirect inguinal hernia sac which I was able to separate from the cord structures.  I opened the sac and all contents had been reduced.  I then tied off the base of sac with 2-0 silk suture.  A piece of ProGrip Covidien Prolene mesh was then brought to the field.  I placed it around the cord structures and manipulated toward the pubis.  I then placed one 2-0 Vicryl suture in the mesh securing it to shelving edge of inguinal ligament.  A good coverage of the inguinal floor and good control of  the cord structures appeared to be achieved.  I then closed the external oblique over top of this with a running 2-0 Vicryl suture.  Scarpa's fascia was then closed with interrupted 3-0 Vicryl sutures and skin was closed with running 4-0 Monocryl.  Steri-Strips, gauze, and tape were then applied.  The patient tolerated the procedure well.  All sponge, needle, and instrument counts were correct at the end of the procedure.  The patient was then extubated in the operating room and taken in a stable condition to the recovery room.     Abigail Miyamoto, M.D.     DB/MEDQ  D:  12/08/2010  T:  12/09/2010  Job:  [id number]  Electronically Signed by Abigail Miyamoto M.D. on 12/11/2010 07:24:34 PM

## 2010-12-24 ENCOUNTER — Ambulatory Visit (INDEPENDENT_AMBULATORY_CARE_PROVIDER_SITE_OTHER): Payer: Medicare Other | Admitting: Surgery

## 2010-12-24 ENCOUNTER — Encounter (INDEPENDENT_AMBULATORY_CARE_PROVIDER_SITE_OTHER): Payer: Self-pay | Admitting: Surgery

## 2010-12-24 DIAGNOSIS — Z09 Encounter for follow-up examination after completed treatment for conditions other than malignant neoplasm: Secondary | ICD-10-CM

## 2010-12-24 NOTE — Progress Notes (Signed)
Subjective:     Patient ID: Ricky Santos, male   DOB: 1934/02/10, 77 y.o.   MRN: [id number]  HPI He is here for his postoperative visit status post right inguinal hernia repair with mesh. He has no complaints. His pain is well-controlled. Review of Systems     Objective:   Physical Exam On exam, his incision is healing well. There is only mild postoperative swelling.    Assessment:    Patient status post right inguinal hernia repair with mesh    Plan:     I told him to refrain from heavy lifting for one more week. He may return to his full activity on August 3. He will see me back as needed

## 2011-03-12 LAB — WOUND CULTURE

## 2011-05-06 ENCOUNTER — Ambulatory Visit (INDEPENDENT_AMBULATORY_CARE_PROVIDER_SITE_OTHER): Payer: Medicare Other

## 2011-05-06 DIAGNOSIS — L02619 Cutaneous abscess of unspecified foot: Secondary | ICD-10-CM

## 2011-05-06 DIAGNOSIS — M79609 Pain in unspecified limb: Secondary | ICD-10-CM

## 2011-12-24 ENCOUNTER — Ambulatory Visit (INDEPENDENT_AMBULATORY_CARE_PROVIDER_SITE_OTHER): Payer: Medicare Other | Admitting: Family Medicine

## 2011-12-24 VITALS — BP 130/80 | HR 84 | Temp 98.2°F | Resp 16 | Ht 67.5 in | Wt 141.4 lb

## 2011-12-24 DIAGNOSIS — IMO0001 Reserved for inherently not codable concepts without codable children: Secondary | ICD-10-CM

## 2011-12-24 DIAGNOSIS — M7918 Myalgia, other site: Secondary | ICD-10-CM

## 2011-12-24 DIAGNOSIS — L0231 Cutaneous abscess of buttock: Secondary | ICD-10-CM

## 2011-12-24 MED ORDER — SULFAMETHOXAZOLE-TRIMETHOPRIM 800-160 MG PO TABS: 1.0000 | ORAL_TABLET | Freq: Two times a day (BID) | ORAL | Status: DC

## 2011-12-24 MED ORDER — HYDROCODONE-ACETAMINOPHEN 5-325 MG PO TABS: 1.0000 | ORAL_TABLET | ORAL | Status: DC | PRN

## 2011-12-24 NOTE — Progress Notes (Signed)
Patient ID: [name] MRN: [id number], DOB: 19-Sep-1933, 78 y.o. Date of Encounter: 12/24/2011, 4:32 PM    PROCEDURE NOTE: Verbal consent obtained. Betadine prep per usual protocol. Local anesthesia obtained with 2% lidocaine plain.  1 cm incision made with 11 blade along lesion.  Culture taken. Moderate amount of purulence expressed. Lesion explored revealing no loculations. Dressed. Wound care instructions including precautions with patient. Patient tolerated the procedure well.     Grier Mitts, PA-C 12/24/2011 4:32 PM

## 2011-12-24 NOTE — Progress Notes (Signed)
Subjective: Painful swollen cystic area on his butt that has been bothering him for 5 days. He has a history of bursa almost causing him to lose a toe in the past. He has had other abscesses in the past but has been long time.  Objective: Right centimeter firm cystic area on the buttock left of the gluteal crease.  Assessment: Abscess of buttock, probably MRSA  Plan: I&D and culture and treat with antibiotics

## 2011-12-27 LAB — WOUND CULTURE

## 2011-12-29 ENCOUNTER — Ambulatory Visit (INDEPENDENT_AMBULATORY_CARE_PROVIDER_SITE_OTHER): Payer: Medicare Other | Admitting: Family Medicine

## 2011-12-29 VITALS — BP 128/72 | HR 82 | Temp 98.6°F | Resp 18 | Ht 67.0 in | Wt 135.0 lb

## 2011-12-29 DIAGNOSIS — L03317 Cellulitis of buttock: Secondary | ICD-10-CM

## 2011-12-29 DIAGNOSIS — L0231 Cutaneous abscess of buttock: Secondary | ICD-10-CM

## 2011-12-29 DIAGNOSIS — R52 Pain, unspecified: Secondary | ICD-10-CM

## 2011-12-29 MED ORDER — SULFAMETHOXAZOLE-TRIMETHOPRIM 800-160 MG PO TABS: 1.0000 | ORAL_TABLET | Freq: Two times a day (BID) | ORAL | Status: AC

## 2011-12-29 NOTE — Patient Instructions (Signed)
Continue antibiotics

## 2011-12-29 NOTE — Progress Notes (Signed)
Subjective: Patient is here for a reassessment of that lesion on his buttock. He's been soaking it several times a day. He's gotten a little bit of blood from it but not a lot of pus. He continues to persist as a big lump there. It is not quite as painful as it was.  Objective. 5 or 6 cm diameter area of induration of the left buttock, above the anus and lateral on the to the gluteal crease. I was able to express some small amounts of pus, which seem to be be milked out of different pockets. Did not get a large core out through the tiny pinhole of an opening. I feel this needs to be repeat I&D.  Culture was positive for MRSA.  Assessment: MRSA abscess of left buttock, not well responded yet.  Plan:  Repeat I&D Continue antibiotics

## 2011-12-29 NOTE — Progress Notes (Signed)
Patient ID: NAPOLEON MONACELLI MRN: [id number], DOB: 03/03/1934, 78 y.o. Date of Encounter: 12/29/2011, 3:53 PM    PROCEDURE NOTE: Verbal consent obtained. Betadine prep per usual protocol. Local anesthesia obtained with 2% lidocaine plain.  1 cm incision made with 11 blade along lesion.  Culture taken. Minimal purulence expressed. Lesion explored revealing at least 1 loculation that a minimal amount of purulence expressed from.  Wound extremely indurated.  Packed with 1/4 packing plain.  Dressed. Wound care instructions including precautions with patient. Patient tolerated the procedure well. Recheck in 48 hours.       Grier Mitts, PA-C 12/29/2011 3:53 PM

## 2011-12-31 ENCOUNTER — Ambulatory Visit (INDEPENDENT_AMBULATORY_CARE_PROVIDER_SITE_OTHER): Payer: Medicare Other | Admitting: Physician Assistant

## 2011-12-31 VITALS — BP 110/64 | HR 68 | Temp 98.4°F | Resp 16 | Ht 67.5 in | Wt 138.0 lb

## 2011-12-31 DIAGNOSIS — L02219 Cutaneous abscess of trunk, unspecified: Secondary | ICD-10-CM

## 2011-12-31 DIAGNOSIS — L02212 Cutaneous abscess of back [any part, except buttock]: Secondary | ICD-10-CM

## 2011-12-31 DIAGNOSIS — L03319 Cellulitis of trunk, unspecified: Secondary | ICD-10-CM

## 2011-12-31 NOTE — Progress Notes (Signed)
Patient ID: Ricky Santos MRN: [id number], DOB: [birth year] 78 y.o. Date of Encounter: 12/31/2011, 2:27 PM  Chief Complaint: Wound care   See previous note  HPI: 78 y.o. y/o male presents for wound care s/p I&D on 12/29/11. Doing well No issues or complaints Afebrile/ no chills No nausea or vomiting Tolerating Bactrim. Pain minimal. Daily dressing change Previous note reviewed  Past Medical History  Diagnosis Date  . Thyroid disease   . Hypertension   . GERD (gastroesophageal reflux disease)   . Cancer   . Back pain   . Hernia      Home Meds: Prior to Admission medications   Medication Sig Start Date End Date Taking? Authorizing Provider  aspirin 81 MG tablet Take 81 mg by mouth daily.     Yes Historical Provider, MD  atorvastatin (LIPITOR) 40 MG tablet Take 40 mg by mouth daily.     Yes Historical Provider, MD  diltiazem (CARDIZEM CD) 240 MG 24 hr capsule Take 240 mg by mouth daily.     Yes Historical Provider, MD  finasteride (PROSCAR) 5 MG tablet Take 5 mg by mouth daily.     Yes Historical Provider, MD  hydrochlorothiazide 25 MG tablet Take 25 mg by mouth daily.     Yes Historical Provider, MD  HYDROcodone-acetaminophen (NORCO/VICODIN) 5-325 MG per tablet Take 1 tablet by mouth every 4 (four) hours as needed for pain. 12/24/11  Yes Peyton Najjar, MD  levothyroxine (SYNTHROID, LEVOTHROID) 25 MCG tablet Take 25 mcg by mouth daily.     Yes Historical Provider, MD  pantoprazole (PROTONIX) 40 MG tablet Take 40 mg by mouth daily.     Yes Historical Provider, MD  psyllium (METAMUCIL) 58.6 % powder Take 1 packet by mouth 3 (three) times daily.     Yes Historical Provider, MD  sildenafil (VIAGRA) 50 MG tablet Take 50 mg by mouth daily as needed.     Yes Historical Provider, MD  sulfamethoxazole-trimethoprim (BACTRIM DS,SEPTRA DS) 800-160 MG per tablet Take 1 tablet by mouth 2 (two) times daily. 12/29/11 01/08/12 Yes Peyton Najjar, MD  Tamsulosin HCl (FLOMAX) 0.4 MG CAPS Take by mouth.      Yes Historical Provider, MD    Allergies: No Known Allergies  ROS: Constitutional: Afebrile, no chills Cardiovascular: negative for chest pain or palpitations Dermatological: Positive for wound. Negative for erythema, pain, or warmth. +induration. GI: No nausea or vomiting   EXAM: Physical Exam: Blood pressure 110/64, pulse 68, temperature 98.4 F (36.9 C), temperature source Oral, resp. rate 16, height 5' 7.5" (1.715 m), weight 138 lb (62.596 kg), SpO2 100.00%., Body mass index is 21.29 kg/(m^2). General: Well developed, well nourished, in no acute distress. Nontoxic appearing. Head: Normocephalic, atraumatic, sclera non-icteric.  Neck: Supple. Lungs: Breathing is unlabored. Heart: Normal rate. Skin:  Warm and moist. Dressing and packing in place. Minmal induration, erythema, or tenderness to palpation. Neuro: Alert and oriented X 3. Moves all extremities spontaneously. Normal gait.  Psych:  Responds to questions appropriately with a normal affect.       PROCEDURE: Dressing and packing removed. Minimal purulence expressed Wound bed healthy Irrigated with 1% plain lidocaine 5 cc. Repacked with 1/4 packing plain. Dressing applied  LAB: Culture:   A/P: 78 y.o. y/o male with buttock cellulitis/abscess as above s/p I&D on Wound care per above Continue Bactrim Pain well controlled Daily dressing changes Recheck 48 hours  Signed, Rhoderick Moody, PA-C 12/31/2011 2:27 PM

## 2012-01-02 ENCOUNTER — Ambulatory Visit (INDEPENDENT_AMBULATORY_CARE_PROVIDER_SITE_OTHER): Payer: Medicare Other | Admitting: Physician Assistant

## 2012-01-02 VITALS — BP 120/67 | HR 83 | Temp 98.2°F | Resp 16 | Ht 67.5 in | Wt 138.0 lb

## 2012-01-02 DIAGNOSIS — L03317 Cellulitis of buttock: Secondary | ICD-10-CM

## 2012-01-02 NOTE — Progress Notes (Signed)
  Subjective:    Patient ID: Ricky Santos, male    DOB: Sep 07, 1933, 78 y.o.   MRN: [id number]  HPI  Pt presents to clinic for recheck of wound.  Much less pain.  Tolerating abx ok.  Review of Systems     Objective:   Physical Exam  Vitals reviewed. Constitutional: He is oriented to person, place, and time. He appears well-developed and well-nourished.  HENT:  Head: Normocephalic and atraumatic.  Right Ear: External ear normal.  Left Ear: External ear normal.  Pulmonary/Chest: Effort normal.  Neurological: He is alert and oriented to person, place, and time.  Skin: Skin is warm and dry.       L buttocks wound - drsg removed and wound healing well - no purulence expressed  Psychiatric: He has a normal mood and affect. His behavior is normal. Judgment and thought content normal.          Assessment & Plan:  Wound buttocks - finish abx, keep covered - no f/u  needed

## 2012-07-26 ENCOUNTER — Telehealth: Payer: Self-pay | Admitting: Oncology

## 2012-07-26 NOTE — Telephone Encounter (Signed)
S/W PT IN REF TO NP APPT. ON 08/10/12@3 :00 REFERRING DR HUSAIN DX-ELEVATED PLATELET  COUNT OF 530 MAILED NP PACKET

## 2012-07-28 ENCOUNTER — Telehealth: Payer: Self-pay | Admitting: Oncology

## 2012-07-28 NOTE — Telephone Encounter (Signed)
C/D 07/28/12 for appt. 08/10/12

## 2012-07-30 DIAGNOSIS — D473 Essential (hemorrhagic) thrombocythemia: Secondary | ICD-10-CM

## 2012-07-30 HISTORY — DX: Essential (hemorrhagic) thrombocythemia: D47.3

## 2012-08-10 ENCOUNTER — Other Ambulatory Visit (HOSPITAL_BASED_OUTPATIENT_CLINIC_OR_DEPARTMENT_OTHER): Payer: Medicare Other | Admitting: Lab

## 2012-08-10 ENCOUNTER — Encounter: Payer: Self-pay | Admitting: Oncology

## 2012-08-10 ENCOUNTER — Ambulatory Visit (HOSPITAL_BASED_OUTPATIENT_CLINIC_OR_DEPARTMENT_OTHER): Payer: Medicare Other | Admitting: Oncology

## 2012-08-10 ENCOUNTER — Ambulatory Visit: Payer: Medicare Other

## 2012-08-10 ENCOUNTER — Telehealth: Payer: Self-pay | Admitting: Oncology

## 2012-08-10 VITALS — BP 132/75 | HR 67 | Temp 97.3°F | Resp 18 | Ht 67.0 in | Wt 144.5 lb

## 2012-08-10 DIAGNOSIS — D473 Essential (hemorrhagic) thrombocythemia: Secondary | ICD-10-CM

## 2012-08-10 LAB — CHCC SMEAR

## 2012-08-10 LAB — COMPREHENSIVE METABOLIC PANEL (CC13)
ALT: 10 U/L (ref 0–55)
AST: 26 U/L (ref 5–34)
CO2: 26 mEq/L (ref 22–29)
Calcium: 9.8 mg/dL (ref 8.4–10.4)
Chloride: 106 mEq/L (ref 98–107)
Creatinine: 1.1 mg/dL (ref 0.7–1.3)
Sodium: 141 mEq/L (ref 136–145)
Total Protein: 7.2 g/dL (ref 6.4–8.3)

## 2012-08-10 LAB — CBC WITH DIFFERENTIAL/PLATELET
Basophils Absolute: 0.1 10*3/uL (ref 0.0–0.1)
EOS%: 4.9 % (ref 0.0–7.0)
Eosinophils Absolute: 0.3 10*3/uL (ref 0.0–0.5)
HCT: 42.4 % (ref 38.4–49.9)
HGB: 13.6 g/dL (ref 13.0–17.1)
LYMPH%: 24.9 % (ref 14.0–49.0)
MCH: 28 pg (ref 27.2–33.4)
MCV: 86.9 fL (ref 79.3–98.0)
MONO%: 8.3 % (ref 0.0–14.0)
NEUT#: 3.8 10*3/uL (ref 1.5–6.5)
NEUT%: 60.4 % (ref 39.0–75.0)
Platelets: 524 10*3/uL — ABNORMAL HIGH (ref 140–400)

## 2012-08-10 LAB — FERRITIN: Ferritin: 29 ng/mL (ref 22–322)

## 2012-08-10 NOTE — Telephone Encounter (Signed)
gv and printed appt schedule for pt for March °

## 2012-08-10 NOTE — Progress Notes (Signed)
[facility]  [phone] [phone]     INITIAL HEMATOLOGY CONSULTATION    Referral MD:  Dr. Georgann Santos, M.D.  Reason for Referral: thrombocytosis.     HPI: Ricky Santos is a 79 year-old man with hypothryoidism, HTN, HLP.  He has history of prostate cancer s/p prostatectomy and has been under remission during follow up with Dr. Isabel Santos.  He was recently found to have thrombocytosis.  He was kindly referred to the [facility] for evaluation.  Ricky Santos presented to the clinic for the first time today with his wife.  He complained of mild dizziness upon standing up fast.  He denied chest pain, palpitation, syncope.  He also has chronic back pain going on for many years.  It is mild, nonprogressive, worse in the morning, improved with activities, no lower extremity paresthesia, no bowel/bladder incontinence.  With respect to his thrombocytosis, he denied fever, anorexia, weight loss, bleeding symptoms, joint swelling, skin rash, abdominal pain, early satiety.  The rest of the 14-point review of system was negative.      Past Medical History  Diagnosis Date  . Hypothyroid   . Hypertension   . GERD (gastroesophageal reflux disease)   . Prostate cancer   . Back pain   . Hernia   . Hyperlipidemia   :    Past Surgical History  Procedure Laterality Date  . Incision and drainage abscess anal  08/01/05  . Prostate surgery    . Shoulder surgery      rt  . Exp lap for trauma    . Hernia repair    . Colonoscopy  2003  . Back surgery    :   CURRENT MEDS: Current Outpatient Prescriptions  Medication Sig Dispense Refill  . aspirin 81 MG tablet Take 81 mg by mouth daily.        Marland Kitchen atorvastatin (LIPITOR) 40 MG tablet Take 40 mg by mouth daily.        Marland Kitchen diltiazem (CARDIZEM CD) 240 MG 24 hr capsule Take 240 mg by mouth daily.        . finasteride (PROSCAR) 5 MG tablet Take 5 mg by mouth daily.        . hydrochlorothiazide 25 MG tablet  Take 25 mg by mouth daily.        Marland Kitchen HYDROcodone-acetaminophen (NORCO/VICODIN) 5-325 MG per tablet Take 1 tablet by mouth every 4 (four) hours as needed for pain.  15 tablet  0  . levothyroxine (SYNTHROID, LEVOTHROID) 25 MCG tablet Take 25 mcg by mouth daily.        . mirtazapine (REMERON) 15 MG tablet Take 15 mg by mouth daily.      . pantoprazole (PROTONIX) 40 MG tablet Take 40 mg by mouth daily.        . psyllium (METAMUCIL) 58.6 % powder Take 1 packet by mouth 3 (three) times daily.        . sildenafil (VIAGRA) 50 MG tablet Take 50 mg by mouth daily as needed.        . Tamsulosin HCl (FLOMAX) 0.4 MG CAPS Take by mouth.         No current facility-administered medications for this visit.      No Known Allergies:  History reviewed. No pertinent family history.:  History   Social History  . Marital Status: Married    Spouse Name: N/A    Number of Children: 3  . Years of Education: N/A  Occupational History  .      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 Smoking status: Current Some Day Smoker -- 0.25 packs/day for 60 years    Types: Cigarettes  . Smokeless tobacco: Never Used  . Alcohol Use: No  . Drug Use: No  . Sexually Active: Not on file   Other Topics Concern  . Not on file   Social History Narrative  . No narrative on file  :  REVIEW OF SYSTEM:  The rest of the 14-point review of sytem was negative.   Exam: ECOG 0.   General:  Thin-appearing man, in no acute distress.  Eyes:  no scleral icterus.  ENT:  There were no oropharyngeal lesions.  Neck was without thyromegaly.  Lymphatics:  Negative cervical, supraclavicular or axillary adenopathy.  Respiratory: lungs were clear bilaterally without wheezing or crackles.  Cardiovascular:  Regular rate and rhythm, S1/S2, without murmur, rub or gallop.  There was no pedal edema.  GI:  abdomen was soft, flat, nontender, nondistended, without organomegaly.  Muscoloskeletal:  no spinal tenderness  of palpation of vertebral spine.  Skin exam was without echymosis, petichae.  Neuro exam was nonfocal.  Patient was able to get on and off exam table without assistance.  Gait was normal.  Patient was alert and oriented.  Attention was good.   Language was appropriate.  Mood was normal without depression.  Speech was not pressured.  Thought content was not tangential.    LABS:  Lab Results  Component Value Date   WBC 6.2 08/10/2012   HGB 13.6 08/10/2012   HCT 42.4 08/10/2012   PLT 524* 08/10/2012   GLUCOSE 106* 08/10/2012   ALT 10 08/10/2012   AST 26 08/10/2012   NA 141 08/10/2012   K 4.1 08/10/2012   CL 106 08/10/2012   CREATININE 1.1 08/10/2012   BUN 12.8 08/10/2012   CO2 26 08/10/2012     Blood smear review:   I personally reviewed the patient's peripheral blood smear today.  There was isocytosis.  There was no peripheral blast.  There was no schistocytosis, spherocytosis, target cell, rouleaux formation, tear drop cell.  There was occasional giant platelets.  There was no platelet clump.      ASSESSMENT AND PLAN:   1.  Issue:  High platelet count (thrombocytosis). 2.  Work up:  Rule out essential thrombocytosis (ET) and chronic myeloid leukemia with JAK-2 and  BCR/ABL.  3.  Follow up:  In about 2 weeks to go over result and treatment as appropriate.  In the meantime, I advised himto continue ASA to decrease the risk of thrombosis.      The length of time of the face-to-face encounter was 30 minutes. More than 50% of time was spent counseling and coordination of care.     Thank you for this referral.

## 2012-08-10 NOTE — Patient Instructions (Addendum)
1.  Issue:  High platelet count (thrombocytosis). 2.  Work up:  Rule out essential thrombocytosis (ET) and chronic myeloid leukemia with blood test 3.  Follow up:  In about 2 weeks to go over result.

## 2012-08-10 NOTE — Progress Notes (Signed)
Checked in new pt with no financial concerns. °

## 2012-08-19 ENCOUNTER — Encounter: Payer: Self-pay | Admitting: Oncology

## 2012-08-19 ENCOUNTER — Ambulatory Visit: Payer: Medicare Other | Admitting: Oncology

## 2012-08-22 ENCOUNTER — Encounter: Payer: Self-pay | Admitting: Oncology

## 2012-08-26 ENCOUNTER — Telehealth: Payer: Self-pay | Admitting: Oncology

## 2012-08-26 ENCOUNTER — Ambulatory Visit (HOSPITAL_BASED_OUTPATIENT_CLINIC_OR_DEPARTMENT_OTHER): Payer: Medicare Other | Admitting: Oncology

## 2012-08-26 ENCOUNTER — Encounter: Payer: Self-pay | Admitting: Oncology

## 2012-08-26 VITALS — BP 133/67 | HR 76 | Temp 97.7°F | Resp 20 | Ht 67.0 in | Wt 147.0 lb

## 2012-08-26 DIAGNOSIS — D473 Essential (hemorrhagic) thrombocythemia: Secondary | ICD-10-CM

## 2012-08-26 MED ORDER — HYDROXYUREA 500 MG PO CAPS: 500.0000 mg | ORAL_CAPSULE | Freq: Two times a day (BID) | ORAL | Status: DC

## 2012-08-26 NOTE — Patient Instructions (Addendum)
1. Diagnosis essential thrombocytosis (ET) 2. Prognosis: Without normalization of platelet count, patients with CT it risk of blood clot such as stroke, heart attack, clot in the veins.\ 3. Recommendations: Continue aspirin. Consider starting Hydrea 500 mg by mouth twice a day. Hydrea has been shown to decrease her risk of blood clot in patients with ET.  4. Followup blood count every 2 weeks and to stabilized platelet count.

## 2012-08-26 NOTE — Progress Notes (Signed)
[facility]  Telephone:(336) [phone] Fax:([phone]   OFFICE PROGRESS NOTE   Cc:  HUSAIN,KARRAR, MD  DIAGNOSIS:  JAK-2 positive essential thrombocytosis.   CURRENT THERAPY:  Here to discuss therapy today.   INTERVAL HISTORY: Ricky Santos 79 y.o. male returns for regular follow up.  He denied all problem.  He has no headache, leg swelling/pain, chest pain, SOB.  The rest of the 14-point review of system was negative.   Past Medical History  Diagnosis Date  . Hypothyroid   . Hypertension   . GERD (gastroesophageal reflux disease)   . Prostate cancer   . Back pain   . Hernia   . Hyperlipidemia   . 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   . Essential thrombocytosis 08/26/2012    Past Surgical History  Procedure Laterality Date  . Incision and drainage abscess anal  08/01/05  . Prostate surgery    . Shoulder surgery      rt  . Exp lap for trauma    . Hernia repair    . Colonoscopy  2003  . Back surgery      Current Outpatient Prescriptions  Medication Sig Dispense Refill  . aspirin 81 MG tablet Take 81 mg by mouth daily.        Marland Kitchen atorvastatin (LIPITOR) 40 MG tablet Take 40 mg by mouth daily.        Marland Kitchen diltiazem (CARDIZEM CD) 240 MG 24 hr capsule Take 240 mg by mouth daily.        . finasteride (PROSCAR) 5 MG tablet Take 5 mg by mouth daily.        . hydrochlorothiazide 25 MG tablet Take 25 mg by mouth daily.        Marland Kitchen levothyroxine (SYNTHROID, LEVOTHROID) 25 MCG tablet Take 25 mcg by mouth daily.        . mirtazapine (REMERON) 15 MG tablet Take 15 mg by mouth daily.      . pantoprazole (PROTONIX) 40 MG tablet Take 40 mg by mouth daily.        . psyllium (METAMUCIL) 58.6 % powder Take 1 packet by mouth 3 (three) times daily.        . Tamsulosin HCl (FLOMAX) 0.4 MG CAPS Take by mouth.        . hydroxyurea (HYDREA) 500 MG capsule Take 1 capsule (500 mg total) by mouth 2 (two) times daily. May take with  food to minimize GI side effects.  60 capsule  3   No current facility-administered medications for this visit.    ALLERGIES:  has No Known Allergies.  Filed Vitals:   08/26/12 1332  BP: 133/67  Pulse: 76  Temp: 97.7 F (36.5 C)  Resp: 20   Wt Readings from Last 3 Encounters:  08/26/12 147 lb (66.679 kg)  08/10/12 144 lb 8 oz (65.545 kg)  01/02/12 138 lb (62.596 kg)   ECOG Performance status:  0  PHYSICAL EXAMINATION: deferred since there was no new symptoms from two weeks ago.      LABORATORY/RADIOLOGY DATA:  Lab Results  Component Value Date   WBC 6.2 08/10/2012   HGB 13.6 08/10/2012   HCT 42.4 08/10/2012   PLT 524* 08/10/2012   GLUCOSE 106* 08/10/2012   ALKPHOS 60 08/10/2012   ALT 10 08/10/2012   AST 26 08/10/2012   NA 141 08/10/2012   K 4.1 08/10/2012   CL 106 08/10/2012   CREATININE 1.1 08/10/2012  BUN 12.8 08/10/2012   CO2 26 08/10/2012    ASSESSMENT AND PLAN:    1. Diagnosis essential thrombocytosis (ET) 2. Prognosis: Without normalization of platelet count, patients with CT it risk of blood clot such as stroke, heart attack, DVT/PE.  3. Recommendations: Continue aspirin. Consider starting Hydrea 500 mg by mouth twice a day. Hydrea has been shown to decrease her risk of blood clot in patients with ET.   Ricky Santos expressed informed understanding and wished to started Hydrea.  4. Followup blood count every 2 weeks and to stabilize platelet count.  Return visit in about 3 months.     The length of time of the face-to-face encounter was 15 minutes. More than 50% of time was spent counseling and coordination of care.

## 2012-09-01 ENCOUNTER — Encounter: Payer: Self-pay | Admitting: Oncology

## 2012-09-09 ENCOUNTER — Other Ambulatory Visit (HOSPITAL_BASED_OUTPATIENT_CLINIC_OR_DEPARTMENT_OTHER): Payer: Medicare Other | Admitting: Lab

## 2012-09-09 DIAGNOSIS — D473 Essential (hemorrhagic) thrombocythemia: Secondary | ICD-10-CM

## 2012-09-09 LAB — CBC WITH DIFFERENTIAL/PLATELET
Eosinophils Absolute: 0.2 10*3/uL (ref 0.0–0.5)
LYMPH%: 26.7 % (ref 14.0–49.0)
MCHC: 33 g/dL (ref 32.0–36.0)
MCV: 87.4 fL (ref 79.3–98.0)
MONO%: 7.9 % (ref 0.0–14.0)
NEUT#: 4 10*3/uL (ref 1.5–6.5)
Platelets: 430 10*3/uL — ABNORMAL HIGH (ref 140–400)
RBC: 4.67 10*6/uL (ref 4.20–5.82)

## 2012-09-12 ENCOUNTER — Telehealth: Payer: Self-pay | Admitting: *Deleted

## 2012-09-12 NOTE — Telephone Encounter (Signed)
Spoke with patient re hgb, he is to continue same dose of hydrea. Patient verbalized understanding.

## 2012-09-12 NOTE — Telephone Encounter (Signed)
Message copied by Reesa Chew on Mon Sep 12, 2012  4:34 PM ------      Message from: Su Hilt C      Created: Mon Sep 12, 2012  4:11 PM                   ----- Message -----         From: Exie Parody, MD         Sent: 09/11/2012   8:44 PM           To: Marlowe Aschoff, RN            Please call pt.  His Hgb has normalized with Hydrea (from polycythemia vera).  Continue current dose.  Thanks. ------

## 2012-09-23 ENCOUNTER — Telehealth: Payer: Self-pay | Admitting: *Deleted

## 2012-09-23 ENCOUNTER — Other Ambulatory Visit (HOSPITAL_BASED_OUTPATIENT_CLINIC_OR_DEPARTMENT_OTHER): Payer: Medicare Other | Admitting: Lab

## 2012-09-23 DIAGNOSIS — D473 Essential (hemorrhagic) thrombocythemia: Secondary | ICD-10-CM

## 2012-09-23 LAB — CBC WITH DIFFERENTIAL/PLATELET
BASO%: 1.7 % (ref 0.0–2.0)
EOS%: 3.1 % (ref 0.0–7.0)
LYMPH%: 31.6 % (ref 14.0–49.0)
MCH: 29.8 pg (ref 27.2–33.4)
MCHC: 33.3 g/dL (ref 32.0–36.0)
MCV: 89.6 fL (ref 79.3–98.0)
MONO#: 0.5 10*3/uL (ref 0.1–0.9)
MONO%: 9.2 % (ref 0.0–14.0)
Platelets: 349 10*3/uL (ref 140–400)
RBC: 4.56 10*6/uL (ref 4.20–5.82)
WBC: 5.8 10*3/uL (ref 4.0–10.3)

## 2012-09-23 NOTE — Telephone Encounter (Signed)
Message copied by Wende Mott on Fri Sep 23, 2012  3:58 PM ------      Message from: Jethro Bolus T      Created: Fri Sep 23, 2012  3:26 PM       Please call pt and inform him that Marshia Ly is working for his ET.  Continue Hydrea and Aspirin at current dose.  No change.  Thanks. ------

## 2012-09-23 NOTE — Telephone Encounter (Signed)
Left Vm for pt informing of Dr. Lodema Pilot message below.  Asked him to return our call if any specific questions or concerns.

## 2012-09-30 ENCOUNTER — Telehealth: Payer: Self-pay | Admitting: Oncology

## 2012-10-07 ENCOUNTER — Other Ambulatory Visit: Payer: Medicare Other

## 2012-10-10 ENCOUNTER — Other Ambulatory Visit (HOSPITAL_BASED_OUTPATIENT_CLINIC_OR_DEPARTMENT_OTHER): Payer: Medicare Other | Admitting: Lab

## 2012-10-10 ENCOUNTER — Encounter: Payer: Self-pay | Admitting: Oncology

## 2012-10-10 DIAGNOSIS — D473 Essential (hemorrhagic) thrombocythemia: Secondary | ICD-10-CM

## 2012-10-10 LAB — CBC WITH DIFFERENTIAL/PLATELET
BASO%: 2 % (ref 0.0–2.0)
EOS%: 3.5 % (ref 0.0–7.0)
HCT: 41.4 % (ref 38.4–49.9)
MCH: 30.1 pg (ref 27.2–33.4)
MCHC: 33.3 g/dL (ref 32.0–36.0)
MONO#: 0.7 10*3/uL (ref 0.1–0.9)
NEUT%: 52.9 % (ref 39.0–75.0)
RBC: 4.57 10*6/uL (ref 4.20–5.82)
WBC: 7.3 10*3/uL (ref 4.0–10.3)
lymph#: 2.3 10*3/uL (ref 0.9–3.3)

## 2012-10-18 ENCOUNTER — Telehealth: Payer: Self-pay | Admitting: Oncology

## 2012-10-21 ENCOUNTER — Other Ambulatory Visit: Payer: Medicare Other

## 2012-11-04 ENCOUNTER — Other Ambulatory Visit: Payer: Medicare Other

## 2012-11-09 ENCOUNTER — Ambulatory Visit
Admission: RE | Admit: 2012-11-09 | Discharge: 2012-11-09 | Disposition: A | Discharge status: Home / Self Care | Payer: Medicare Other | Source: Ambulatory Visit | Attending: Gastroenterology | Admitting: Gastroenterology

## 2012-11-09 ENCOUNTER — Encounter (HOSPITAL_COMMUNITY): Payer: Self-pay | Admitting: Physical Medicine and Rehabilitation

## 2012-11-09 ENCOUNTER — Other Ambulatory Visit: Payer: Self-pay | Admitting: Gastroenterology

## 2012-11-09 ENCOUNTER — Emergency Department (HOSPITAL_COMMUNITY)
Admission: EM | Admit: 2012-11-09 | Discharge: 2012-11-09 | Disposition: A | Discharge status: Home / Self Care | Payer: Medicare Other | Attending: Emergency Medicine | Admitting: Emergency Medicine

## 2012-11-09 DIAGNOSIS — I10 Essential (primary) hypertension: Secondary | ICD-10-CM | POA: Insufficient documentation

## 2012-11-09 DIAGNOSIS — Z862 Personal history of diseases of the blood and blood-forming organs and certain disorders involving the immune mechanism: Secondary | ICD-10-CM | POA: Insufficient documentation

## 2012-11-09 DIAGNOSIS — K5900 Constipation, unspecified: Secondary | ICD-10-CM | POA: Insufficient documentation

## 2012-11-09 DIAGNOSIS — Z8719 Personal history of other diseases of the digestive system: Secondary | ICD-10-CM | POA: Insufficient documentation

## 2012-11-09 DIAGNOSIS — E039 Hypothyroidism, unspecified: Secondary | ICD-10-CM | POA: Insufficient documentation

## 2012-11-09 DIAGNOSIS — E785 Hyperlipidemia, unspecified: Secondary | ICD-10-CM | POA: Insufficient documentation

## 2012-11-09 DIAGNOSIS — R142 Eructation: Secondary | ICD-10-CM | POA: Insufficient documentation

## 2012-11-09 DIAGNOSIS — Z79899 Other long term (current) drug therapy: Secondary | ICD-10-CM | POA: Insufficient documentation

## 2012-11-09 DIAGNOSIS — Z8546 Personal history of malignant neoplasm of prostate: Secondary | ICD-10-CM | POA: Insufficient documentation

## 2012-11-09 DIAGNOSIS — K219 Gastro-esophageal reflux disease without esophagitis: Secondary | ICD-10-CM | POA: Insufficient documentation

## 2012-11-09 DIAGNOSIS — R141 Gas pain: Secondary | ICD-10-CM | POA: Insufficient documentation

## 2012-11-09 DIAGNOSIS — Z7982 Long term (current) use of aspirin: Secondary | ICD-10-CM | POA: Insufficient documentation

## 2012-11-09 DIAGNOSIS — F17200 Nicotine dependence, unspecified, uncomplicated: Secondary | ICD-10-CM | POA: Insufficient documentation

## 2012-11-09 LAB — OCCULT BLOOD, POC DEVICE: Fecal Occult Bld: NEGATIVE

## 2012-11-09 LAB — COMPREHENSIVE METABOLIC PANEL
ALT: 9 U/L (ref 0–53)
AST: 25 U/L (ref 0–37)
Albumin: 4.2 g/dL (ref 3.5–5.2)
Alkaline Phosphatase: 59 U/L (ref 39–117)
CO2: 27 mEq/L (ref 19–32)
Chloride: 104 mEq/L (ref 96–112)
GFR calc non Af Amer: 67 mL/min — ABNORMAL LOW (ref >90)
Potassium: 4.3 mEq/L (ref 3.5–5.1)
Total Bilirubin: 0.6 mg/dL (ref 0.3–1.2)

## 2012-11-09 LAB — CBC WITH DIFFERENTIAL/PLATELET
Basophils Absolute: 0.1 10*3/uL (ref 0.0–0.1)
Basophils Relative: 2 % — ABNORMAL HIGH (ref 0–1)
HCT: 42.5 % (ref 39.0–52.0)
Hemoglobin: 14.3 g/dL (ref 13.0–17.0)
Lymphocytes Relative: 37 % (ref 12–46)
MCHC: 33.6 g/dL (ref 30.0–36.0)
Neutro Abs: 2.7 10*3/uL (ref 1.7–7.7)
Neutrophils Relative %: 51 % (ref 43–77)
RDW: 17.2 % — ABNORMAL HIGH (ref 11.5–15.5)
WBC: 5.2 10*3/uL (ref 4.0–10.5)

## 2012-11-09 NOTE — ED Notes (Signed)
MD at bedside. Littie Deeds MD

## 2012-11-09 NOTE — ED Provider Notes (Signed)
History     CSN: [id number]  Arrival date & time 11/09/12  1552   First MD Initiated Contact with Patient 11/09/12 1841      Chief Complaint  Patient presents with  . Constipation    (Consider location/radiation/quality/duration/timing/severity/associated sxs/prior treatment) Patient is a 79 y.o. male presenting with constipation.  Constipation Severity:  No pain Time since last bowel movement:  1 week Timing:  Constant Progression:  Unchanged Chronicity:  New Context: not dehydration   Stool description:  None produced Relieved by:  Nothing Ineffective treatments: golytely prep. Associated symptoms: no abdominal pain, no back pain, no diarrhea, no dysuria, no fever, no flatus, no nausea and no vomiting     Past Medical History  Diagnosis Date  . Hypothyroid   . Hypertension   . GERD (gastroesophageal reflux disease)   . Prostate cancer   . Back pain   . Hernia   . Hyperlipidemia   . 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   . Essential thrombocytosis 08/26/2012    Past Surgical History  Procedure Laterality Date  . Incision and drainage abscess anal  08/01/05  . Prostate surgery    . Shoulder surgery      rt  . Exp lap for trauma    . Hernia repair    . Colonoscopy  2003  . Back surgery      History reviewed. No pertinent family history.  History  Substance Use Topics  . Smoking status: Current Some Day Smoker -- 0.25 packs/day for 60 years    Types: Cigarettes  . Smokeless tobacco: Never Used  . Alcohol Use: No      Review of Systems  Constitutional: Negative for fever and chills.  HENT: Negative for congestion, sore throat and rhinorrhea.   Eyes: Negative for photophobia and visual disturbance.  Respiratory: Negative for cough and shortness of breath.   Cardiovascular: Negative for chest pain and leg swelling.  Gastrointestinal: Positive for constipation. Negative for nausea, vomiting, abdominal  pain, diarrhea and flatus.  Endocrine: Negative for polydipsia and polyuria.  Genitourinary: Negative for dysuria and hematuria.  Musculoskeletal: Negative for back pain and arthralgias.  Skin: Negative for color change and rash.  Neurological: Negative for dizziness, syncope, light-headedness and headaches.  Hematological: Negative for adenopathy. Does not bruise/bleed easily.  All other systems reviewed and are negative.    Allergies  Review of patient's allergies indicates no known allergies.  Home Medications   Current Outpatient Rx  Name  Route  Sig  Dispense  Refill  . aspirin 81 MG tablet   Oral   Take 81 mg by mouth daily.           Marland Kitchen atorvastatin (LIPITOR) 40 MG tablet   Oral   Take 40 mg by mouth daily.           Marland Kitchen diltiazem (CARDIZEM CD) 240 MG 24 hr capsule   Oral   Take 240 mg by mouth daily.           . finasteride (PROSCAR) 5 MG tablet   Oral   Take 5 mg by mouth daily.           . hydrochlorothiazide 25 MG tablet   Oral   Take 25 mg by mouth daily.           . hydroxyurea (HYDREA) 500 MG capsule   Oral   Take 500 mg by mouth 2 (two) times daily.         Marland Kitchen  levothyroxine (SYNTHROID, LEVOTHROID) 25 MCG tablet   Oral   Take 25 mcg by mouth daily.           . mirtazapine (REMERON) 15 MG tablet   Oral   Take 15 mg by mouth at bedtime.          . pantoprazole (PROTONIX) 40 MG tablet   Oral   Take 40 mg by mouth daily.           . psyllium (METAMUCIL) 58.6 % powder   Oral   Take 1 packet by mouth 3 (three) times daily.           . Tamsulosin HCl (FLOMAX) 0.4 MG CAPS   Oral   Take 0.4 mg by mouth daily.            BP 147/69  Pulse 80  Temp(Src) 97.7 F (36.5 C) (Oral)  Resp 17  SpO2 100%  Physical Exam  Vitals reviewed. Constitutional: He is oriented to person, place, and time. He appears well-developed and well-nourished.  HENT:  Head: Normocephalic and atraumatic.  Eyes: Conjunctivae and EOM are normal.  Neck:  Normal range of motion. Neck supple.  Cardiovascular: Normal rate, regular rhythm and normal heart sounds.   Pulmonary/Chest: Effort normal and breath sounds normal. No respiratory distress.  Abdominal: He exhibits distension. There is no tenderness. There is no rebound and no guarding.  Genitourinary: Rectum normal. Rectal exam shows no external hemorrhoid, no internal hemorrhoid, no mass, no tenderness and anal tone normal. Guaiac negative stool.  No stool in rectal vault  Musculoskeletal: Normal range of motion.  Neurological: He is alert and oriented to person, place, and time.  Skin: Skin is warm and dry.    ED Course  Procedures (including critical care time)  Labs Reviewed  CBC WITH DIFFERENTIAL - Abnormal; Notable for the following:    RDW 17.2 (*)    Basophils Relative 2 (*)    All other components within normal limits  COMPREHENSIVE METABOLIC PANEL - Abnormal; Notable for the following:    GFR calc non Af Amer 67 (*)    GFR calc Af Amer 78 (*)    All other components within normal limits  LACTIC ACID, PLASMA - Abnormal; Notable for the following:    Lactic Acid, Venous 2.3 (*)    All other components within normal limits  OCCULT BLOOD, POC DEVICE   Dg Abd Acute W/chest  11/09/2012   *RADIOLOGY REPORT*  Clinical Data: Constipation and shortness of breath.  ACUTE ABDOMEN SERIES (ABDOMEN 2 VIEW & CHEST 1 VIEW)  Comparison: Two-view chest 12/05/2010  Findings: The heart size is normal.  The lungs are clear.  Supine and upright views the abdomen demonstrate a loop of bowel extending to the right of the spine.  This raises concern for an early sigmoid volvulus.  There is no evidence for more proximal colonic obstruction.  Less likely, this could represent a loop of small bowel.  Degenerative changes are evident in the lower lumbar spine.  There is focal curvature convex right at L4-5.  IMPRESSION:  1.  No acute cardiopulmonary disease. 2.  Focal dilated loop of bowel just to the  right of lumbar spine raises concern for early sigmoid volvulus.  There is no evidence for more proximal colonic obstruction.  A small bowel loop is also considered.   Original Report Authenticated By: Marin Roberts, M.D.     1. Constipation       MDM  79 y.o. male  with pertinent PMH of HTN, GERD,  presents with possible sigmoid volvulus.  Possible early volvulus found incidentally after pt drank golytely prep, did not have BM when scheduled for routine colonoscopy today.  He denies pain, nausea, vomiting, or other symptoms.  He has a ho chronic constipation and states that it is not abnormal to wait 1-2 weeks for BM at baseline.  He was referred for further wu. Labs as above unremarkable, very mild lactic acidosis, pt without PO intake for fasting.  Physical exam today with distension, no pain, otherwise unremarkable.  Spoke with Dr. Ronney Asters, feels pt stable for dc home with call to GI tomorrow and close fu, possible slow transit of prep solution.  No indication for acute Mesenteric ischemia, acute volvulus, or other emergent pathology necessitating admission or intervention at this time.  Pt voices understanding and agrees to fu.    Labs and imaging as above reviewed by myself and attending,Dr. Fredderick Phenix, with whom case was discussed.   1. Constipation             Noel Gerold, MD 11/09/12 2313  Noel Gerold, MD 11/09/12 2315

## 2012-11-09 NOTE — ED Provider Notes (Signed)
Pt with possible early sigmoid volvulus found incidentally after pt was unable to have BM following colonoscopy prep.  No abd pain, mild distension on exam.  No vomiting.  Will consult with his GI  Rolan Bucco, MD 11/09/12 1925

## 2012-11-09 NOTE — ED Provider Notes (Signed)
I saw and evaluated the patient, reviewed the resident's note and I agree with the findings and plan.   Ricky Bucco, MD 11/09/12 [phone]

## 2012-11-09 NOTE — ED Notes (Signed)
Pt presents to department for evaluation of constipation. Was sent to ED from [facility] (Dr. Laural Benes) for evaluation of possible bowel obstruction. He was scheduled to have colonoscopy today, but did not. Pt states issues with constipation for several weeks. Pt reports small hard stools. Denies abdominal pain. Denies urinary symptoms. Pt is alert and oriented x4. Denies pain at the time.

## 2012-11-11 ENCOUNTER — Other Ambulatory Visit: Payer: Self-pay | Admitting: Gastroenterology

## 2012-11-11 DIAGNOSIS — R52 Pain, unspecified: Secondary | ICD-10-CM

## 2012-11-16 ENCOUNTER — Ambulatory Visit
Admission: RE | Admit: 2012-11-16 | Discharge: 2012-11-16 | Disposition: A | Discharge status: Home / Self Care | Payer: Medicare Other | Source: Ambulatory Visit | Attending: Gastroenterology | Admitting: Gastroenterology

## 2012-11-16 DIAGNOSIS — R52 Pain, unspecified: Secondary | ICD-10-CM

## 2012-11-25 ENCOUNTER — Other Ambulatory Visit (HOSPITAL_BASED_OUTPATIENT_CLINIC_OR_DEPARTMENT_OTHER): Payer: Medicare Other | Admitting: Lab

## 2012-11-25 ENCOUNTER — Telehealth: Payer: Self-pay | Admitting: Oncology

## 2012-11-25 ENCOUNTER — Ambulatory Visit (HOSPITAL_BASED_OUTPATIENT_CLINIC_OR_DEPARTMENT_OTHER): Payer: Medicare Other | Admitting: Oncology

## 2012-11-25 ENCOUNTER — Encounter: Payer: Self-pay | Admitting: Oncology

## 2012-11-25 VITALS — BP 123/73 | HR 83 | Temp 97.7°F | Resp 20 | Ht 67.0 in | Wt 143.3 lb

## 2012-11-25 DIAGNOSIS — D473 Essential (hemorrhagic) thrombocythemia: Secondary | ICD-10-CM

## 2012-11-25 LAB — CBC WITH DIFFERENTIAL/PLATELET
Basophils Absolute: 0 10*3/uL (ref 0.0–0.1)
EOS%: 2.9 % (ref 0.0–7.0)
Eosinophils Absolute: 0.2 10*3/uL (ref 0.0–0.5)
HCT: 40.8 % (ref 38.4–49.9)
HGB: 13.9 g/dL (ref 13.0–17.1)
MCH: 31.7 pg (ref 27.2–33.4)
MONO#: 0.3 10*3/uL (ref 0.1–0.9)
NEUT#: 3.9 10*3/uL (ref 1.5–6.5)
NEUT%: 63 % (ref 39.0–75.0)
RDW: 16.3 % — ABNORMAL HIGH (ref 11.0–14.6)
WBC: 6.1 10*3/uL (ref 4.0–10.3)
lymph#: 1.7 10*3/uL (ref 0.9–3.3)

## 2012-11-25 LAB — COMPREHENSIVE METABOLIC PANEL (CC13)
AST: 21 U/L (ref 5–34)
Albumin: 3.7 g/dL (ref 3.5–5.0)
BUN: 15.4 mg/dL (ref 7.0–26.0)
CO2: 23 mEq/L (ref 22–29)
Calcium: 9.6 mg/dL (ref 8.4–10.4)
Chloride: 109 mEq/L (ref 98–109)
Creatinine: 1.1 mg/dL (ref 0.7–1.3)
Glucose: 118 mg/dl (ref 70–140)
Potassium: 3.9 mEq/L (ref 3.5–5.1)

## 2012-11-25 NOTE — Telephone Encounter (Signed)
Gave pt appt for lab and MD on october 2014

## 2012-11-25 NOTE — Progress Notes (Signed)
[facility]  [phone] [phone]   OFFICE PROGRESS NOTE   Cc:  HUSAIN,KARRAR, MD  DIAGNOSIS:  JAK-2 positive essential thrombocytosis.   CURRENT THERAPY:  Started Hydrea 500 mg daily in March 2014.   INTERVAL HISTORY: Ricky Santos 79 y.o. male returns for regular follow up.  He denied all problem.  He has no headache, leg swelling/pain, chest pain, SOB.  Tolerating Hydrea well without side effects. The rest of the 14-point review of system was negative.   Past Medical History  Diagnosis Date  . Hypothyroid   . Hypertension   . GERD (gastroesophageal reflux disease)   . Prostate cancer   . Back pain   . Hernia   . Hyperlipidemia   . 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   . Essential thrombocytosis 08/26/2012    Past Surgical History  Procedure Laterality Date  . Incision and drainage abscess anal  08/01/05  . Prostate surgery    . Shoulder surgery      rt  . Exp lap for trauma    . Hernia repair    . Colonoscopy  2003  . Back surgery      Current Outpatient Prescriptions  Medication Sig Dispense Refill  . aspirin 81 MG tablet Take 81 mg by mouth daily.        Marland Kitchen atorvastatin (LIPITOR) 40 MG tablet Take 40 mg by mouth daily.        Marland Kitchen diltiazem (CARDIZEM CD) 240 MG 24 hr capsule Take 240 mg by mouth daily.        . finasteride (PROSCAR) 5 MG tablet Take 5 mg by mouth daily.        . hydrochlorothiazide 25 MG tablet Take 25 mg by mouth daily.        . hydroxyurea (HYDREA) 500 MG capsule Take 500 mg by mouth 2 (two) times daily.      Marland Kitchen levothyroxine (SYNTHROID, LEVOTHROID) 25 MCG tablet Take 25 mcg by mouth daily.        . mirtazapine (REMERON) 15 MG tablet Take 15 mg by mouth at bedtime.       . pantoprazole (PROTONIX) 40 MG tablet Take 40 mg by mouth daily.        . psyllium (METAMUCIL) 58.6 % powder Take 1 packet by mouth 3 (three) times daily.        . Tamsulosin HCl  (FLOMAX) 0.4 MG CAPS Take 0.4 mg by mouth daily.        No current facility-administered medications for this visit.    ALLERGIES:  has No Known Allergies.  Filed Vitals:   11/25/12 1418  BP: 123/73  Pulse: 83  Temp: 97.7 F (36.5 C)  Resp: 20   Wt Readings from Last 3 Encounters:  11/25/12 143 lb 4.8 oz (65 kg)  08/26/12 147 lb (66.679 kg)  08/10/12 144 lb 8 oz (65.545 kg)   ECOG Performance status:  0  PHYSICAL EXAMINATION:   General: Thin-appearing man, in no acute distress. Eyes: no scleral icterus. ENT: There were no oropharyngeal lesions. Neck was without thyromegaly. Lymphatics: Negative cervical, supraclavicular or axillary adenopathy. Respiratory: lungs were clear bilaterally without wheezing or crackles. Cardiovascular: Regular rate and rhythm, S1/S2, without murmur, rub or gallop. There was no pedal edema. GI: abdomen was soft, flat, nontender, nondistended, without organomegaly. Muscoloskeletal: no spinal tenderness of palpation of vertebral spine. Skin exam was without echymosis, petichae. Neuro exam was nonfocal. Patient  was able to get on and off exam table without assistance. Gait was normal. Patient was alert and oriented. Attention was good. Language was appropriate. Mood was normal without depression. Speech was not pressured. Thought content was not tangential.    LABORATORY/RADIOLOGY DATA:  Lab Results  Component Value Date   WBC 6.1 11/25/2012   HGB 13.9 11/25/2012   HCT 40.8 11/25/2012   PLT 390 11/25/2012   GLUCOSE 118 11/25/2012   ALKPHOS 53 11/25/2012   ALT 10 11/25/2012   AST 21 11/25/2012   NA 142 11/25/2012   K 3.9 11/25/2012   CL 104 11/09/2012   CREATININE 1.1 11/25/2012   BUN 15.4 11/25/2012   CO2 23 11/25/2012    ASSESSMENT AND PLAN:    1. Essential thrombocytosis (ET): The patient is currently on Hydrea 500 mg daily and tolerating this well. Platelet count has normalized. I have recommended that he continue Hydrea 500 mg daily. I've also  encouraged him to take aspirin 81 mg daily. 2. Followup: CBC in 2 months and return visit with labs in 4 months     The length of time of the face-to-face encounter was 15 minutes. More than 50% of time was spent counseling and coordination of care.

## 2012-12-26 ENCOUNTER — Other Ambulatory Visit: Payer: Self-pay | Admitting: Gastroenterology

## 2013-01-17 ENCOUNTER — Encounter (HOSPITAL_COMMUNITY): Payer: Self-pay

## 2013-01-17 ENCOUNTER — Encounter (HOSPITAL_COMMUNITY)
Admission: RE | Disposition: A | Discharge status: Home / Self Care | Payer: Self-pay | Source: Ambulatory Visit | Attending: Gastroenterology

## 2013-01-17 ENCOUNTER — Ambulatory Visit (HOSPITAL_COMMUNITY)
Admission: RE | Admit: 2013-01-17 | Discharge: 2013-01-17 | Disposition: A | Discharge status: Home / Self Care | Payer: Medicare Other | Source: Ambulatory Visit | Attending: Gastroenterology | Admitting: Gastroenterology

## 2013-01-17 DIAGNOSIS — Z8546 Personal history of malignant neoplasm of prostate: Secondary | ICD-10-CM | POA: Insufficient documentation

## 2013-01-17 DIAGNOSIS — E7800 Pure hypercholesterolemia, unspecified: Secondary | ICD-10-CM | POA: Insufficient documentation

## 2013-01-17 DIAGNOSIS — Z1211 Encounter for screening for malignant neoplasm of colon: Secondary | ICD-10-CM | POA: Insufficient documentation

## 2013-01-17 DIAGNOSIS — K5900 Constipation, unspecified: Secondary | ICD-10-CM | POA: Insufficient documentation

## 2013-01-17 DIAGNOSIS — I10 Essential (primary) hypertension: Secondary | ICD-10-CM | POA: Insufficient documentation

## 2013-01-17 DIAGNOSIS — K5730 Diverticulosis of large intestine without perforation or abscess without bleeding: Secondary | ICD-10-CM | POA: Insufficient documentation

## 2013-01-17 DIAGNOSIS — D473 Essential (hemorrhagic) thrombocythemia: Secondary | ICD-10-CM | POA: Insufficient documentation

## 2013-01-17 HISTORY — PX: COLONOSCOPY: SHX5424

## 2013-01-17 SURGERY — COLONOSCOPY
Anesthesia: Moderate Sedation

## 2013-01-17 MED ORDER — MIDAZOLAM HCL 10 MG/2ML IJ SOLN
INTRAMUSCULAR | Status: AC
  Filled 2013-01-17: qty 4

## 2013-01-17 MED ORDER — SODIUM CHLORIDE 0.9 % IV SOLN
INTRAVENOUS | Status: DC
  Administered 2013-01-17: 500 mL via INTRAVENOUS

## 2013-01-17 MED ORDER — FENTANYL CITRATE 0.05 MG/ML IJ SOLN
INTRAMUSCULAR | Status: DC | PRN
  Administered 2013-01-17 (×2): 25 ug via INTRAVENOUS

## 2013-01-17 MED ORDER — MIDAZOLAM HCL 5 MG/5ML IJ SOLN
INTRAMUSCULAR | Status: DC | PRN
  Administered 2013-01-17 (×2): 2.5 mg via INTRAVENOUS

## 2013-01-17 MED ORDER — FENTANYL CITRATE 0.05 MG/ML IJ SOLN
INTRAMUSCULAR | Status: AC
  Filled 2013-01-17: qty 4

## 2013-01-17 NOTE — H&P (Signed)
  Procedure: Screening colonoscopy.   History: The patient is a 79 year old male born 19-Sep-1933. He underwent a normal screening colonoscopy 2004. The patient is scheduled to undergo a repeat screening colonoscopy today.  Past medical history: Hypertension. Prostate cancer. Hypercholesterolemia. Chronic constipation. Colonic diverticulosis. Goiter. Chronic low back pain. Benign positional vertigo. Right rotator cuff surgery. Essential thrombocytosis. Transurethral resection of the prostate.  Medication allergies: None  Exam: The patient is alert and lying comfortably on the endoscopy stretcher. Abdomen is soft and nontender to palpation. Lungs are clear to auscultation. Cardiac exam reveals a regular rhythm.  Plan: Proceed with repeat screening colonoscopy

## 2013-01-17 NOTE — Op Note (Signed)
Procedure: Screening colonoscopy  Endoscopist: Danise Edge  Premedication: Versed 5 mg and fentanyl 50 mcg  Procedure: The patient was placed in the left lateral decubitus position. Anal inspection and digital rectal exam were normal. The Pentax pediatric colonoscope was introduced into the rectum and advanced to the cecum. A normal-appearing appendiceal orifice was identified. Colonic preparation for the exam today was good.  Rectum. Normal. Retroflexed view of the distal rectum normal.  Sigmoid colon and descending colon. Left colonic diverticulosis  Splenic flexure. Normal.  Transverse colon. Normal.  Hepatic flexure. Normal.  Ascending colon. Normal.  Cecum and ileocecal valve. Normal.  Assessment: Normal screening proctocolonoscopy to the cecum.

## 2013-01-18 ENCOUNTER — Encounter (HOSPITAL_COMMUNITY): Payer: Self-pay | Admitting: Gastroenterology

## 2013-03-27 ENCOUNTER — Encounter (INDEPENDENT_AMBULATORY_CARE_PROVIDER_SITE_OTHER): Payer: Self-pay

## 2013-03-27 ENCOUNTER — Encounter: Payer: Self-pay | Admitting: Internal Medicine

## 2013-03-27 ENCOUNTER — Other Ambulatory Visit (HOSPITAL_BASED_OUTPATIENT_CLINIC_OR_DEPARTMENT_OTHER): Payer: Medicare Other | Admitting: Lab

## 2013-03-27 ENCOUNTER — Ambulatory Visit (HOSPITAL_BASED_OUTPATIENT_CLINIC_OR_DEPARTMENT_OTHER): Payer: Medicare Other | Admitting: Internal Medicine

## 2013-03-27 ENCOUNTER — Other Ambulatory Visit: Payer: Self-pay | Admitting: Internal Medicine

## 2013-03-27 VITALS — BP 126/76 | HR 87 | Temp 97.4°F | Resp 18 | Ht 69.0 in | Wt 142.7 lb

## 2013-03-27 DIAGNOSIS — D473 Essential (hemorrhagic) thrombocythemia: Secondary | ICD-10-CM

## 2013-03-27 DIAGNOSIS — E876 Hypokalemia: Secondary | ICD-10-CM

## 2013-03-27 LAB — COMPREHENSIVE METABOLIC PANEL (CC13)
Albumin: 3.6 g/dL (ref 3.5–5.0)
Alkaline Phosphatase: 57 U/L (ref 40–150)
Anion Gap: 9 mEq/L (ref 3–11)
BUN: 12.4 mg/dL (ref 7.0–26.0)
CO2: 23 mEq/L (ref 22–29)
Calcium: 9.6 mg/dL (ref 8.4–10.4)
Chloride: 105 mEq/L (ref 98–109)
Creatinine: 1 mg/dL (ref 0.7–1.3)
Glucose: 105 mg/dl (ref 70–140)
Potassium: 3.4 mEq/L — ABNORMAL LOW (ref 3.5–5.1)
Total Protein: 7.1 g/dL (ref 6.4–8.3)

## 2013-03-27 LAB — CBC WITH DIFFERENTIAL/PLATELET
Basophils Absolute: 0 10*3/uL (ref 0.0–0.1)
Eosinophils Absolute: 0.1 10*3/uL (ref 0.0–0.5)
HCT: 39.4 % (ref 38.4–49.9)
HGB: 13.3 g/dL (ref 13.0–17.1)
MCH: 31.1 pg (ref 27.2–33.4)
MONO#: 0.4 10*3/uL (ref 0.1–0.9)
NEUT#: 3.2 10*3/uL (ref 1.5–6.5)
NEUT%: 56.3 % (ref 39.0–75.0)
Platelets: 382 10*3/uL (ref 140–400)
WBC: 5.8 10*3/uL (ref 4.0–10.3)
lymph#: 2 10*3/uL (ref 0.9–3.3)

## 2013-03-27 NOTE — Patient Instructions (Signed)
Platelet Count The platelet count is the number of platelets per mm in a sample of your blood. Platelets are an important part of the blood's clotting process. This test is used to evaluate patients who develop bleeding disorders. PREPARATION FOR TEST No preparation or fasting is needed.  Avoid strenuous exercise prior to this test.  For females, inform your caregiver if you are pregnant or menstruating. NORMAL FINDINGS  Adult/elderly: 150,000 to 400,000/mm or 150 to 400 x 109/L (SI units)  Premature infant: 100,000 to 300,000/ mm  Newborn: 150,000 to 300,000/ mm  Infant: 200,000 to 475,000/ mm  Child: 150,000 to 400,000/ mm Possible critical values: less than 50,000 or greater than 1 million/ mm Ranges for normal findings may vary among different laboratories and hospitals. You should always check with your caregiver after having lab work or other tests done to discuss the meaning of your test results and whether your values are considered within normal limits. MEANING OF TEST  Your caregiver will go over the test results with you. Your caregiver will discuss the importance and meaning of your results. He or she will also discuss treatment options and additional tests, if needed. OBTAINING THE TEST RESULTS It is your responsibility to obtain your test results. Ask the lab or department performing the test when and how you will get your results. Document Released: 06/12/2004 Document Revised: 08/10/2011 Document Reviewed: 04/29/2008 [employer] Patient Information 2014 [employer].  

## 2013-03-29 NOTE — Progress Notes (Signed)
[facility] OFFICE PROGRESS NOTE  Ricky Housekeeper, MD [address], [address] [place] [address]  DIAGNOSIS: Essential thrombocytosis - Plan: CBC with Differential, Comprehensive metabolic panel, CBC with Differential in 2 months  Chief Complaint  Patient presents with  . Essential thrombocytosis    CURRENT THERAPY: Hydrea 500 mg daily  INTERVAL HISTORY: Ricky Santos 79 y.o. male  With a history of Essential Thrombocytosis with JAK-2 mutation positive is here for follow-up.  He was last seen by NP Clenton Pare on 11/25/2012.  He reports doing well.  He has no headache, leg swelling/pain, chest pain, SOB. Tolerating Hydrea well without side effects. The rest of the 14-point review of system was negative.    MEDICAL HISTORY: Past Medical History  Diagnosis Date  . Hypothyroid   . Hypertension   . GERD (gastroesophageal reflux disease)   . Prostate cancer   . Back pain   . Hernia   . Hyperlipidemia   . 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   . Essential thrombocytosis 08/26/2012    INTERIM HISTORY: has Essential thrombocytosis on his problem list.    ALLERGIES:  has No Known Allergies.  MEDICATIONS: has a current medication list which includes the following prescription(s): aspirin, atorvastatin, diltiazem, finasteride, hydrochlorothiazide, hydroxyurea, levothyroxine, mirtazapine, pantoprazole, psyllium, and tamsulosin.  SURGICAL HISTORY:  Past Surgical History  Procedure Laterality Date  . Incision and drainage abscess anal  08/01/05  . Prostate surgery    . Shoulder surgery      rt  . Exp lap for trauma    . Hernia repair    . Colonoscopy  2003  . Back surgery    . Colonoscopy N/A 01/17/2013    Procedure: COLONOSCOPY;  Surgeon: Charolett Bumpers, MD;  Location: [facility];  Service: Endoscopy;  Laterality: N/A;    REVIEW OF SYSTEMS:   Constitutional: Denies fevers, chills or abnormal  weight loss Eyes: Denies blurriness of vision Ears, nose, mouth, throat, and face: Denies mucositis or sore throat Respiratory: Denies cough, dyspnea or wheezes Cardiovascular: Denies palpitation, chest discomfort or lower extremity swelling Gastrointestinal:  Denies nausea, heartburn or change in bowel habits Skin: Denies abnormal skin rashes Lymphatics: Denies new lymphadenopathy or easy bruising Neurological:Denies numbness, tingling or new weaknesses Behavioral/Psych: Mood is stable, no new changes  All other systems were reviewed with the patient and are negative.  PHYSICAL EXAMINATION: ECOG PERFORMANCE STATUS: 0 - Asymptomatic  Blood pressure 126/76, pulse 87, temperature 97.4 F (36.3 C), temperature source Oral, resp. rate 18, height 5\' 9"  (1.753 m), weight 142 lb 11.2 oz (64.728 kg).  GENERAL:alert, no distress and comfortable SKIN: skin color, texture, turgor are normal, no rashes or significant lesions EYES: normal, Conjunctiva are pink and non-injected, sclera clear OROPHARYNX:no exudate, no erythema and lips, buccal mucosa, and tongue normal  NECK: supple, thyroid normal size, non-tender, without nodularity LYMPH:  no palpable lymphadenopathy in the cervical, axillary or supraclavicular LUNGS: clear to auscultation and percussion with normal breathing effort HEART: regular rate & rhythm and no murmurs and no lower extremity edema ABDOMEN:abdomen soft, non-tender and normal bowel sounds Musculoskeletal:no cyanosis of digits and no clubbing  NEURO: alert & oriented x 3 with fluent speech, no focal motor/sensory deficits   LABORATORY DATA: Results for orders placed in visit on 03/27/13 (from the past 48 hour(s))  CBC WITH DIFFERENTIAL     Status: None   Collection Time    03/27/13  2:42 PM  Result Value Range   WBC 5.8  4.0 - 10.3 10e3/uL   NEUT# 3.2  1.5 - 6.5 10e3/uL   HGB 13.3  13.0 - 17.1 g/dL   HCT 82.9  56.2 - 13.0 %   Platelets 382  140 - 400 10e3/uL    MCV 92.3  79.3 - 98.0 fL   MCH 31.1  27.2 - 33.4 pg   MCHC 33.8  32.0 - 36.0 g/dL   RBC 8.65  7.84 - 6.96 10e6/uL   RDW 13.9  11.0 - 14.6 %   lymph# 2.0  0.9 - 3.3 10e3/uL   MONO# 0.4  0.1 - 0.9 10e3/uL   Eosinophils Absolute 0.1  0.0 - 0.5 10e3/uL   Basophils Absolute 0.0  0.0 - 0.1 10e3/uL   NEUT% 56.3  39.0 - 75.0 %   LYMPH% 34.1  14.0 - 49.0 %   MONO% 6.6  0.0 - 14.0 %   EOS% 2.3  0.0 - 7.0 %   BASO% 0.7  0.0 - 2.0 %  COMPREHENSIVE METABOLIC PANEL (CC13)     Status: Abnormal   Collection Time    03/27/13  2:42 PM      Result Value Range   Sodium 138  136 - 145 mEq/L   Potassium 3.4 (*) 3.5 - 5.1 mEq/L   Chloride 105  98 - 109 mEq/L   CO2 23  22 - 29 mEq/L   Glucose 105  70 - 140 mg/dl   BUN 29.5  7.0 - 28.4 mg/dL   Creatinine 1.0  0.7 - 1.3 mg/dL   Total Bilirubin 1.32  0.20 - 1.20 mg/dL   Alkaline Phosphatase 57  40 - 150 U/L   AST 23  5 - 34 U/L   ALT 8  0 - 55 U/L   Total Protein 7.1  6.4 - 8.3 g/dL   Albumin 3.6  3.5 - 5.0 g/dL   Calcium 9.6  8.4 - 44.0 mg/dL   Anion Gap 9  3 - 11 mEq/L       Labs:  Lab Results  Component Value Date   WBC 5.8 03/27/2013   HGB 13.3 03/27/2013   HCT 39.4 03/27/2013   MCV 92.3 03/27/2013   PLT 382 03/27/2013   NEUTROABS 3.2 03/27/2013      Chemistry      Component Value Date/Time   NA 138 03/27/2013 1442   NA 141 11/09/2012 1652   K 3.4* 03/27/2013 1442   K 4.3 11/09/2012 1652   CL 104 11/09/2012 1652   CL 106 08/10/2012 1509   CO2 23 03/27/2013 1442   CO2 27 11/09/2012 1652   BUN 12.4 03/27/2013 1442   BUN 12 11/09/2012 1652   CREATININE 1.0 03/27/2013 1442   CREATININE 1.04 11/09/2012 1652      Component Value Date/Time   CALCIUM 9.6 03/27/2013 1442   CALCIUM 9.6 11/09/2012 1652   ALKPHOS 57 03/27/2013 1442   ALKPHOS 59 11/09/2012 1652   AST 23 03/27/2013 1442   AST 25 11/09/2012 1652   ALT 8 03/27/2013 1442   ALT 9 11/09/2012 1652   BILITOT 0.66 03/27/2013 1442   BILITOT 0.6 11/09/2012 1652     Basic Metabolic  Panel:  Recent Labs Lab 03/27/13 1442  NA 138  K 3.4*  CO2 23  GLUCOSE 105  BUN 12.4  CREATININE 1.0  CALCIUM 9.6   GFR Estimated Creatinine Clearance: 56.6 ml/min (by C-G formula based on Cr of 1). Liver Function Tests:  Recent  Labs Lab 03/27/13 1442  AST 23  ALT 8  ALKPHOS 57  BILITOT 0.66  PROT 7.1  ALBUMIN 3.6   CBC:  Recent Labs Lab 03/27/13 1442  WBC 5.8  NEUTROABS 3.2  HGB 13.3  HCT 39.4  MCV 92.3  PLT 382   Studies:  No results found.   RADIOGRAPHIC STUDIES: No results found.  ASSESSMENT: Ricky Santos 79 y.o. male with a history of Essential thrombocytosis - Plan: CBC with Differential, Comprehensive metabolic panel, CBC with Differential in 2 months   PLAN:  1. Essential Thrombocytosis.  -- He continues to do well on present therapy.  His plts are less that 400.  The patient is currently on Hydrea 500 mg daily and tolerating this well.  I have recommended that he continue Hydrea 500 mg daily. I've also encouraged him to take aspirin 81 mg daily.  2. Mild Hypokalemia. --Potassium is 3.4 today.  Counseled to increase dietary intake of potassium.  If persists, he will have to have Hydrocholorothiazide dose decreased.  He is aware.   3. Follow-up.  --CBC in 2 months and return visit with labs in 4 months   All questions were answered. The patient knows to call the clinic with any problems, questions or concerns. We can certainly see the patient much sooner if necessary.  I spent 10 minutes counseling the patient face to face. The total time spent in the appointment was 15 minutes.    Innocence Schlotzhauer, MD 03/29/2013 8:37 AM

## 2013-03-30 ENCOUNTER — Telehealth: Payer: Self-pay | Admitting: Internal Medicine

## 2013-03-30 NOTE — Telephone Encounter (Signed)
lvm for pt regarding to Dec and Feb appt....mailed pt letter and avs..Marland Kitchen

## 2013-05-24 ENCOUNTER — Other Ambulatory Visit: Payer: Medicare Other

## 2013-05-29 ENCOUNTER — Telehealth: Payer: Self-pay | Admitting: Internal Medicine

## 2013-05-29 NOTE — Telephone Encounter (Signed)
returned pt call and lvm with new d/t for missed lab

## 2013-06-02 ENCOUNTER — Other Ambulatory Visit (HOSPITAL_BASED_OUTPATIENT_CLINIC_OR_DEPARTMENT_OTHER): Payer: BC Managed Care – PPO

## 2013-06-02 DIAGNOSIS — D473 Essential (hemorrhagic) thrombocythemia: Secondary | ICD-10-CM

## 2013-06-02 LAB — COMPREHENSIVE METABOLIC PANEL (CC13)
ALT: 10 U/L (ref 0–55)
ANION GAP: 13 meq/L — AB (ref 3–11)
AST: 25 U/L (ref 5–34)
Albumin: 4.3 g/dL (ref 3.5–5.0)
Alkaline Phosphatase: 73 U/L (ref 40–150)
BUN: 11.8 mg/dL (ref 7.0–26.0)
CHLORIDE: 104 meq/L (ref 98–109)
CO2: 25 meq/L (ref 22–29)
Calcium: 10.1 mg/dL (ref 8.4–10.4)
Creatinine: 1 mg/dL (ref 0.7–1.3)
Glucose: 74 mg/dl (ref 70–140)
Potassium: 3.6 mEq/L (ref 3.5–5.1)
Sodium: 142 mEq/L (ref 136–145)
Total Bilirubin: 0.6 mg/dL (ref 0.20–1.20)
Total Protein: 8.3 g/dL (ref 6.4–8.3)

## 2013-06-02 LAB — CBC WITH DIFFERENTIAL/PLATELET
BASO%: 0.8 % (ref 0.0–2.0)
Basophils Absolute: 0.1 10*3/uL (ref 0.0–0.1)
EOS%: 3 % (ref 0.0–7.0)
Eosinophils Absolute: 0.2 10*3/uL (ref 0.0–0.5)
HEMATOCRIT: 45.5 % (ref 38.4–49.9)
HGB: 15.2 g/dL (ref 13.0–17.1)
LYMPH%: 29 % (ref 14.0–49.0)
MCH: 31 pg (ref 27.2–33.4)
MCHC: 33.4 g/dL (ref 32.0–36.0)
MCV: 92.7 fL (ref 79.3–98.0)
MONO#: 0.4 10*3/uL (ref 0.1–0.9)
MONO%: 6.6 % (ref 0.0–14.0)
NEUT#: 4 10*3/uL (ref 1.5–6.5)
NEUT%: 60.6 % (ref 39.0–75.0)
Platelets: 452 10*3/uL — ABNORMAL HIGH (ref 140–400)
RBC: 4.91 10*6/uL (ref 4.20–5.82)
RDW: 14.2 % (ref 11.0–14.6)
WBC: 6.6 10*3/uL (ref 4.0–10.3)
lymph#: 1.9 10*3/uL (ref 0.9–3.3)
nRBC: 0 % (ref 0–0)

## 2013-07-19 ENCOUNTER — Other Ambulatory Visit (HOSPITAL_BASED_OUTPATIENT_CLINIC_OR_DEPARTMENT_OTHER): Payer: Medicare Other

## 2013-07-19 ENCOUNTER — Ambulatory Visit (HOSPITAL_BASED_OUTPATIENT_CLINIC_OR_DEPARTMENT_OTHER): Payer: BC Managed Care – PPO | Admitting: Internal Medicine

## 2013-07-19 VITALS — BP 134/64 | HR 92 | Temp 98.4°F | Resp 18 | Ht 69.0 in | Wt 149.2 lb

## 2013-07-19 DIAGNOSIS — D473 Essential (hemorrhagic) thrombocythemia: Secondary | ICD-10-CM

## 2013-07-19 LAB — CBC WITH DIFFERENTIAL/PLATELET
BASO%: 0.9 % (ref 0.0–2.0)
Basophils Absolute: 0.1 10*3/uL (ref 0.0–0.1)
EOS%: 1.9 % (ref 0.0–7.0)
Eosinophils Absolute: 0.1 10*3/uL (ref 0.0–0.5)
HEMATOCRIT: 43.7 % (ref 38.4–49.9)
HGB: 14.3 g/dL (ref 13.0–17.1)
LYMPH%: 21.4 % (ref 14.0–49.0)
MCH: 30.9 pg (ref 27.2–33.4)
MCHC: 32.7 g/dL (ref 32.0–36.0)
MCV: 94.6 fL (ref 79.3–98.0)
MONO#: 0.5 10*3/uL (ref 0.1–0.9)
MONO%: 6.9 % (ref 0.0–14.0)
NEUT#: 4.7 10*3/uL (ref 1.5–6.5)
NEUT%: 68.9 % (ref 39.0–75.0)
PLATELETS: 283 10*3/uL (ref 140–400)
RBC: 4.62 10*6/uL (ref 4.20–5.82)
RDW: 15 % — ABNORMAL HIGH (ref 11.0–14.6)
WBC: 6.8 10*3/uL (ref 4.0–10.3)
lymph#: 1.5 10*3/uL (ref 0.9–3.3)

## 2013-07-19 LAB — COMPREHENSIVE METABOLIC PANEL (CC13)
ALK PHOS: 57 U/L (ref 40–150)
ALT: 11 U/L (ref 0–55)
AST: 22 U/L (ref 5–34)
Albumin: 4.1 g/dL (ref 3.5–5.0)
Anion Gap: 12 mEq/L — ABNORMAL HIGH (ref 3–11)
BILIRUBIN TOTAL: 0.5 mg/dL (ref 0.20–1.20)
BUN: 11.5 mg/dL (ref 7.0–26.0)
CO2: 26 meq/L (ref 22–29)
CREATININE: 1 mg/dL (ref 0.7–1.3)
Calcium: 9.9 mg/dL (ref 8.4–10.4)
Chloride: 104 mEq/L (ref 98–109)
Glucose: 125 mg/dl (ref 70–140)
Potassium: 3.7 mEq/L (ref 3.5–5.1)
Sodium: 142 mEq/L (ref 136–145)
Total Protein: 7.3 g/dL (ref 6.4–8.3)

## 2013-07-20 ENCOUNTER — Telehealth: Payer: Self-pay | Admitting: Internal Medicine

## 2013-07-20 NOTE — Progress Notes (Signed)
[place], [place] [employer], [address] [place] [address]  DIAGNOSIS: Essential thrombocytosis - Plan: CBC with Differential, CBC with Differential, Comprehensive metabolic panel (Cmet) - [facility]  Chief Complaint  Patient presents with  . Essential thrombocytosis    CURRENT THERAPY: Hydrea 500 mg daily  INTERVAL HISTORY: Ricky Santos 80 y.o. male with a history of Essential Thrombocytosis with JAK-2 mutation positive is here for follow-up.  He was last seen by me on 03/27/2013.  He reports doing well.  He has no headache, leg swelling/pain, chest pain, SOB. Tolerating Hydrea well without side effects. The rest of the 14-point review of system was negative.    MEDICAL HISTORY: Past Medical History  Diagnosis Date  . Hypothyroid   . Hypertension   . GERD (gastroesophageal reflux disease)   . Prostate cancer   . Back pain   . Hernia   . Hyperlipidemia   . 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   . Essential thrombocytosis 08/26/2012    INTERIM HISTORY: has Essential thrombocytosis on his problem list.    ALLERGIES:  has No Known Allergies.  MEDICATIONS: has a current medication list which includes the following prescription(s): aspirin, atorvastatin, diltiazem, finasteride, hydrochlorothiazide, hydroxyurea, levothyroxine, mirtazapine, pantoprazole, tamsulosin, and psyllium.  SURGICAL HISTORY:  Past Surgical History  Procedure Laterality Date  . Incision and drainage abscess anal  08/01/05  . Prostate surgery    . Shoulder surgery      rt  . Exp lap for trauma    . Hernia repair    . Colonoscopy  2003  . Back surgery    . Colonoscopy N/A 01/17/2013    Procedure: COLONOSCOPY;  Surgeon: Garlan Fair, MD;  Location: [facility];  Service: Endoscopy;  Laterality: N/A;    REVIEW OF SYSTEMS:   Constitutional: Denies fevers, chills or abnormal weight  loss Eyes: Denies blurriness of vision Ears, nose, mouth, throat, and face: Denies mucositis or sore throat Respiratory: Denies cough, dyspnea or wheezes Cardiovascular: Denies palpitation, chest discomfort or lower extremity swelling Gastrointestinal:  Denies nausea, heartburn or change in bowel habits Skin: Denies abnormal skin rashes Lymphatics: Denies new lymphadenopathy or easy bruising Neurological:Denies numbness, tingling or new weaknesses Behavioral/Psych: Mood is stable, no new changes  All other systems were reviewed with the patient and are negative.  PHYSICAL EXAMINATION: ECOG PERFORMANCE STATUS: 0 - Asymptomatic  Blood pressure 134/64, pulse 92, temperature 98.4 F (36.9 C), temperature source Oral, resp. rate 18, height 5\' 9"  (1.753 m), weight 149 lb 3.2 oz (67.677 kg).  GENERAL:alert, no distress and comfortable SKIN: skin color, texture, turgor are normal, no rashes or significant lesions EYES: normal, Conjunctiva are pink and non-injected, sclera clear OROPHARYNX:no exudate, no erythema and lips, buccal mucosa, and tongue normal  NECK: supple, thyroid normal size, non-tender, without nodularity LYMPH:  no palpable lymphadenopathy in the cervical, axillary or supraclavicular LUNGS: clear to auscultation and percussion with normal breathing effort HEART: regular rate & rhythm and no murmurs and no lower extremity edema ABDOMEN:abdomen soft, non-tender and normal bowel sounds Musculoskeletal:no cyanosis of digits and no clubbing  NEURO: alert & oriented x 3 with fluent speech, no focal motor/sensory deficits   LABORATORY DATA: Results for orders placed in visit on 07/19/13 (from the past 48 hour(s))  CBC WITH DIFFERENTIAL     Status: Abnormal   Collection Time    07/19/13  2:23 PM  Result Value Ref Range   WBC 6.8  4.0 - 10.3 10e3/uL   NEUT# 4.7  1.5 - 6.5 10e3/uL   HGB 14.3  13.0 - 17.1 g/dL   HCT 43.7  38.4 - 49.9 %   Platelets 283  140 - 400 10e3/uL    MCV 94.6  79.3 - 98.0 fL   MCH 30.9  27.2 - 33.4 pg   MCHC 32.7  32.0 - 36.0 g/dL   RBC 4.62  4.20 - 5.82 10e6/uL   RDW 15.0 (*) 11.0 - 14.6 %   lymph# 1.5  0.9 - 3.3 10e3/uL   MONO# 0.5  0.1 - 0.9 10e3/uL   Eosinophils Absolute 0.1  0.0 - 0.5 10e3/uL   Basophils Absolute 0.1  0.0 - 0.1 10e3/uL   NEUT% 68.9  39.0 - 75.0 %   LYMPH% 21.4  14.0 - 49.0 %   MONO% 6.9  0.0 - 14.0 %   EOS% 1.9  0.0 - 7.0 %   BASO% 0.9  0.0 - 2.0 %  COMPREHENSIVE METABOLIC PANEL (0000000)     Status: Abnormal   Collection Time    07/19/13  2:23 PM      Result Value Ref Range   Sodium 142  136 - 145 mEq/L   Potassium 3.7  3.5 - 5.1 mEq/L   Chloride 104  98 - 109 mEq/L   CO2 26  22 - 29 mEq/L   Glucose 125  70 - 140 mg/dl   BUN 11.5  7.0 - 26.0 mg/dL   Creatinine 1.0  0.7 - 1.3 mg/dL   Total Bilirubin 0.50  0.20 - 1.20 mg/dL   Alkaline Phosphatase 57  40 - 150 U/L   AST 22  5 - 34 U/L   ALT 11  0 - 55 U/L   Total Protein 7.3  6.4 - 8.3 g/dL   Albumin 4.1  3.5 - 5.0 g/dL   Calcium 9.9  8.4 - 10.4 mg/dL   Anion Gap 12 (*) 3 - 11 mEq/L       Labs:  Lab Results  Component Value Date   WBC 6.8 07/19/2013   HGB 14.3 07/19/2013   HCT 43.7 07/19/2013   MCV 94.6 07/19/2013   PLT 283 07/19/2013   NEUTROABS 4.7 07/19/2013      Chemistry      Component Value Date/Time   NA 142 07/19/2013 1423   NA 141 11/09/2012 1652   K 3.7 07/19/2013 1423   K 4.3 11/09/2012 1652   CL 104 11/09/2012 1652   CL 106 08/10/2012 1509   CO2 26 07/19/2013 1423   CO2 27 11/09/2012 1652   BUN 11.5 07/19/2013 1423   BUN 12 11/09/2012 1652   CREATININE 1.0 07/19/2013 1423   CREATININE 1.04 11/09/2012 1652      Component Value Date/Time   CALCIUM 9.9 07/19/2013 1423   CALCIUM 9.6 11/09/2012 1652   ALKPHOS 57 07/19/2013 1423   ALKPHOS 59 11/09/2012 1652   AST 22 07/19/2013 1423   AST 25 11/09/2012 1652   ALT 11 07/19/2013 1423   ALT 9 11/09/2012 1652   BILITOT 0.50 07/19/2013 1423   BILITOT 0.6 11/09/2012 1652     Basic Metabolic  Panel:  Recent Labs Lab 07/19/13 1423  NA 142  K 3.7  CO2 26  GLUCOSE 125  BUN 11.5  CREATININE 1.0  CALCIUM 9.9   GFR Estimated Creatinine Clearance: 59.2 ml/min (by C-G formula based on Cr of 1). Liver Function Tests:  Recent Labs Lab 07/19/13 1423  AST 22  ALT 11  ALKPHOS 57  BILITOT 0.50  PROT 7.3  ALBUMIN 4.1   CBC:  Recent Labs Lab 07/19/13 1423  WBC 6.8  NEUTROABS 4.7  HGB 14.3  HCT 43.7  MCV 94.6  PLT 283   Studies:  No results found.   RADIOGRAPHIC STUDIES: No results found.  ASSESSMENT: Ricky Santos 80 y.o. male with a history of Essential thrombocytosis - Plan: CBC with Differential, CBC with Differential, Comprehensive metabolic panel (Cmet) - [facility]   PLAN:  1. Essential Thrombocytosis.  -- He continues to do well on present therapy.  His plts are less that 400.  The patient is currently on Hydrea 500 mg daily and tolerating this well.  I have recommended that he continue Hydrea 500 mg daily. I've also encouraged him to take aspirin 81 mg daily.  2. Follow-up.  --CBC in 2 months and return visit with labs in 4 months   All questions were answered. The patient knows to call the clinic with any problems, questions or concerns. We can certainly see the patient much sooner if necessary.  I spent 10 minutes counseling the patient face to face. The total time spent in the appointment was 15 minutes.    Hinley Brimage, MD 07/20/2013 5:37 AM

## 2013-07-20 NOTE — Telephone Encounter (Signed)
, °

## 2013-09-13 ENCOUNTER — Other Ambulatory Visit: Payer: Self-pay | Admitting: *Deleted

## 2013-09-13 DIAGNOSIS — D473 Essential (hemorrhagic) thrombocythemia: Secondary | ICD-10-CM

## 2013-09-13 MED ORDER — HYDROXYUREA 500 MG PO CAPS: 500.0000 mg | ORAL_CAPSULE | Freq: Two times a day (BID) | ORAL | Status: DC

## 2013-09-15 ENCOUNTER — Ambulatory Visit (HOSPITAL_BASED_OUTPATIENT_CLINIC_OR_DEPARTMENT_OTHER): Payer: Medicare Other

## 2013-09-15 ENCOUNTER — Other Ambulatory Visit: Payer: BC Managed Care – PPO

## 2013-09-15 DIAGNOSIS — D473 Essential (hemorrhagic) thrombocythemia: Secondary | ICD-10-CM

## 2013-09-15 LAB — CBC WITH DIFFERENTIAL/PLATELET
BASO%: 1.1 % (ref 0.0–2.0)
BASOS ABS: 0.1 10*3/uL (ref 0.0–0.1)
EOS%: 3.9 % (ref 0.0–7.0)
Eosinophils Absolute: 0.2 10*3/uL (ref 0.0–0.5)
HEMATOCRIT: 37.7 % — AB (ref 38.4–49.9)
HEMOGLOBIN: 12.9 g/dL — AB (ref 13.0–17.1)
LYMPH#: 1.5 10*3/uL (ref 0.9–3.3)
LYMPH%: 32.8 % (ref 14.0–49.0)
MCH: 32.3 pg (ref 27.2–33.4)
MCHC: 34.2 g/dL (ref 32.0–36.0)
MCV: 94.5 fL (ref 79.3–98.0)
MONO#: 0.5 10*3/uL (ref 0.1–0.9)
MONO%: 10.2 % (ref 0.0–14.0)
NEUT#: 2.4 10*3/uL (ref 1.5–6.5)
NEUT%: 52 % (ref 39.0–75.0)
Platelets: 361 10*3/uL (ref 140–400)
RBC: 3.99 10*6/uL — ABNORMAL LOW (ref 4.20–5.82)
RDW: 15 % — ABNORMAL HIGH (ref 11.0–14.6)
WBC: 4.6 10*3/uL (ref 4.0–10.3)
nRBC: 0 % (ref 0–0)

## 2013-11-16 ENCOUNTER — Other Ambulatory Visit (HOSPITAL_BASED_OUTPATIENT_CLINIC_OR_DEPARTMENT_OTHER): Payer: Medicare Other

## 2013-11-16 ENCOUNTER — Ambulatory Visit (HOSPITAL_BASED_OUTPATIENT_CLINIC_OR_DEPARTMENT_OTHER): Payer: BC Managed Care – PPO | Admitting: Internal Medicine

## 2013-11-16 ENCOUNTER — Encounter: Payer: Self-pay | Admitting: Internal Medicine

## 2013-11-16 VITALS — BP 128/72 | HR 80 | Temp 98.1°F | Resp 19 | Ht 69.0 in | Wt 148.8 lb

## 2013-11-16 DIAGNOSIS — D473 Essential (hemorrhagic) thrombocythemia: Secondary | ICD-10-CM

## 2013-11-16 LAB — COMPREHENSIVE METABOLIC PANEL (CC13)
ALK PHOS: 67 U/L (ref 40–150)
ALT: 9 U/L (ref 0–55)
AST: 22 U/L (ref 5–34)
Albumin: 4 g/dL (ref 3.5–5.0)
Anion Gap: 10 mEq/L (ref 3–11)
BUN: 13.6 mg/dL (ref 7.0–26.0)
CO2: 26 mEq/L (ref 22–29)
Calcium: 10 mg/dL (ref 8.4–10.4)
Chloride: 104 mEq/L (ref 98–109)
Creatinine: 1.3 mg/dL (ref 0.7–1.3)
Glucose: 96 mg/dl (ref 70–140)
Potassium: 4.2 mEq/L (ref 3.5–5.1)
Sodium: 139 mEq/L (ref 136–145)
Total Bilirubin: 0.65 mg/dL (ref 0.20–1.20)
Total Protein: 7.4 g/dL (ref 6.4–8.3)

## 2013-11-16 LAB — CBC WITH DIFFERENTIAL/PLATELET
BASO%: 1.3 % (ref 0.0–2.0)
Basophils Absolute: 0.1 10*3/uL (ref 0.0–0.1)
EOS%: 2.6 % (ref 0.0–7.0)
Eosinophils Absolute: 0.1 10*3/uL (ref 0.0–0.5)
HCT: 42 % (ref 38.4–49.9)
HEMOGLOBIN: 14 g/dL (ref 13.0–17.1)
LYMPH%: 30.5 % (ref 14.0–49.0)
MCH: 33.5 pg — ABNORMAL HIGH (ref 27.2–33.4)
MCHC: 33.2 g/dL (ref 32.0–36.0)
MCV: 100.8 fL — ABNORMAL HIGH (ref 79.3–98.0)
MONO#: 0.4 10*3/uL (ref 0.1–0.9)
MONO%: 7.3 % (ref 0.0–14.0)
NEUT%: 58.3 % (ref 39.0–75.0)
NEUTROS ABS: 3 10*3/uL (ref 1.5–6.5)
Platelets: 346 10*3/uL (ref 140–400)
RBC: 4.17 10*6/uL — ABNORMAL LOW (ref 4.20–5.82)
RDW: 14.4 % (ref 11.0–14.6)
WBC: 5.1 10*3/uL (ref 4.0–10.3)
lymph#: 1.6 10*3/uL (ref 0.9–3.3)

## 2013-11-16 NOTE — Progress Notes (Signed)
[place], [place] [employer], [address] [place]  [address]  DIAGNOSIS: Essential thrombocytosis - Plan: CBC with Differential, Basic metabolic panel (Bmet) - [facility], CBC with Differential  Chief Complaint  Patient presents with  . Essential thrombocytosis    CURRENT THERAPY: Hydrea 500 mg daily  INTERVAL HISTORY: Ricky Santos 80 y.o. male with a history of Essential Thrombocytosis with JAK-2 mutation positive is here for follow-up.  He was last seen by me on 07/19/2013.  He reports doing well.  He has no headache, leg swelling/pain, chest pain, SOB. Tolerating Hydrea well without side effects. The rest of the 14-point review of system was negative. He had a colonoscopy earlier this year by Dr. Wynetta Emery.  He golfs occasionally.  He reports intermittent numbness in his right foot that has resolved.   MEDICAL HISTORY: Past Medical History  Diagnosis Date  . Hypothyroid   . Hypertension   . GERD (gastroesophageal reflux disease)   . Prostate cancer   . Back pain   . Hernia   . Hyperlipidemia   . 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   . Essential thrombocytosis 08/26/2012    INTERIM HISTORY: has Essential thrombocytosis on his problem list.    ALLERGIES:  has No Known Allergies.  MEDICATIONS: has a current medication list which includes the following prescription(s): aspirin, atorvastatin, diltiazem, finasteride, hydrochlorothiazide, hydroxyurea, levothyroxine, mirtazapine, pantoprazole, psyllium, and tamsulosin.  SURGICAL HISTORY:  Past Surgical History  Procedure Laterality Date  . Incision and drainage abscess anal  08/01/05  . Prostate surgery    . Shoulder surgery      rt  . Exp lap for trauma    . Hernia repair    . Colonoscopy  2003  . Back surgery    . Colonoscopy N/A 01/17/2013    Procedure: COLONOSCOPY;  Surgeon: Garlan Fair, MD;  Location: [facility];  Service: Endoscopy;  Laterality: N/A;    REVIEW OF SYSTEMS:   Constitutional: Denies fevers, chills or abnormal weight loss Eyes: Denies blurriness of vision Ears, nose, mouth, throat, and face: Denies mucositis or sore throat Respiratory: Denies cough, dyspnea or wheezes Cardiovascular: Denies palpitation, chest discomfort or lower extremity swelling Gastrointestinal:  Denies nausea, heartburn or change in bowel habits Skin: Denies abnormal skin rashes Lymphatics: Denies new lymphadenopathy or easy bruising Neurological:Denies numbness, tingling or new weaknesses Behavioral/Psych: Mood is stable, no new changes  All other systems were reviewed with the patient and are negative.  PHYSICAL EXAMINATION: ECOG PERFORMANCE STATUS: 0 - Asymptomatic  Blood pressure 128/72, pulse 80, temperature 98.1 F (36.7 C), temperature source Oral, resp. rate 19, height 5\' 9"  (1.753 m), weight 148 lb 12.8 oz (67.495 kg), SpO2 100.00%.  GENERAL:alert, no distress and comfortable SKIN: skin color, texture, turgor are normal, no rashes or significant lesions EYES: normal, Conjunctiva are pink and non-injected, sclera clear OROPHARYNX:no exudate, no erythema and lips, buccal mucosa, and tongue normal  NECK: supple, thyroid normal size, non-tender, without nodularity LYMPH:  no palpable lymphadenopathy in the cervical, axillary or supraclavicular LUNGS: clear to auscultation and percussion with normal breathing effort HEART: regular rate & rhythm and no murmurs and no lower extremity edema ABDOMEN:abdomen soft, non-tender and normal bowel sounds Musculoskeletal:no cyanosis of digits and no clubbing  NEURO: alert & oriented x 3 with fluent speech, no focal motor/sensory deficits   LABORATORY DATA: Results for orders placed in visit on 11/16/13 (from the past  48 hour(s))  CBC WITH DIFFERENTIAL     Status: Abnormal   Collection Time    11/16/13 12:45 PM      Result Value Ref Range   WBC 5.1   4.0 - 10.3 10e3/uL   NEUT# 3.0  1.5 - 6.5 10e3/uL   HGB 14.0  13.0 - 17.1 g/dL   HCT 42.0  38.4 - 49.9 %   Platelets 346  140 - 400 10e3/uL   MCV 100.8 (*) 79.3 - 98.0 fL   MCH 33.5 (*) 27.2 - 33.4 pg   MCHC 33.2  32.0 - 36.0 g/dL   RBC 4.17 (*) 4.20 - 5.82 10e6/uL   RDW 14.4  11.0 - 14.6 %   lymph# 1.6  0.9 - 3.3 10e3/uL   MONO# 0.4  0.1 - 0.9 10e3/uL   Eosinophils Absolute 0.1  0.0 - 0.5 10e3/uL   Basophils Absolute 0.1  0.0 - 0.1 10e3/uL   NEUT% 58.3  39.0 - 75.0 %   LYMPH% 30.5  14.0 - 49.0 %   MONO% 7.3  0.0 - 14.0 %   EOS% 2.6  0.0 - 7.0 %   BASO% 1.3  0.0 - 2.0 %       Labs:  Lab Results  Component Value Date   WBC 5.1 11/16/2013   HGB 14.0 11/16/2013   HCT 42.0 11/16/2013   MCV 100.8* 11/16/2013   PLT 346 11/16/2013   NEUTROABS 3.0 11/16/2013      Chemistry      Component Value Date/Time   NA 142 07/19/2013 1423   NA 141 11/09/2012 1652   K 3.7 07/19/2013 1423   K 4.3 11/09/2012 1652   CL 104 11/09/2012 1652   CL 106 08/10/2012 1509   CO2 26 07/19/2013 1423   CO2 27 11/09/2012 1652   BUN 11.5 07/19/2013 1423   BUN 12 11/09/2012 1652   CREATININE 1.0 07/19/2013 1423   CREATININE 1.04 11/09/2012 1652      Component Value Date/Time   CALCIUM 9.9 07/19/2013 1423   CALCIUM 9.6 11/09/2012 1652   ALKPHOS 57 07/19/2013 1423   ALKPHOS 59 11/09/2012 1652   AST 22 07/19/2013 1423   AST 25 11/09/2012 1652   ALT 11 07/19/2013 1423   ALT 9 11/09/2012 1652   BILITOT 0.50 07/19/2013 1423   BILITOT 0.6 11/09/2012 1652     Basic Metabolic Panel: No results found for this basename: NA, K, CL, CO2, GLUCOSE, BUN, CREATININE, CALCIUM, MG, PHOS,  in the last 168 hours GFR Estimated Creatinine Clearance: 58.1 ml/min (by C-G formula based on Cr of 1). Liver Function Tests: No results found for this basename: AST, ALT, ALKPHOS, BILITOT, PROT, ALBUMIN,  in the last 168 hours CBC:  Recent Labs Lab 11/16/13 1245  WBC 5.1  NEUTROABS 3.0  HGB 14.0  HCT 42.0  MCV 100.8*  PLT 346    Studies:  No results found.   RADIOGRAPHIC STUDIES: No results found.  ASSESSMENT: Ricky Santos 80 y.o. male with a history of Essential thrombocytosis - Plan: CBC with Differential, Basic metabolic panel (Bmet) - [facility], CBC with Differential   PLAN:  1. Essential Thrombocytosis.  -- He continues to do well on present therapy.  His plts are less that 400.  The patient is currently on Hydrea 500 mg daily and tolerating this well.  I have recommended that he continue Hydrea 500 mg daily. I've also encouraged him to take aspirin 81 mg daily.  2. Follow-up.  --CBC in  3 months and return visit with labs in 6 months   All questions were answered. The patient knows to call the clinic with any problems, questions or concerns. We can certainly see the patient much sooner if necessary.  I spent 10 minutes counseling the patient face to face. The total time spent in the appointment was 15 minutes.    Federick Levene, MD 11/16/2013 1:42 PM

## 2013-11-17 ENCOUNTER — Telehealth: Payer: Self-pay | Admitting: Internal Medicine

## 2013-11-17 NOTE — Telephone Encounter (Signed)
s.w. pt and advised on SEpt and Dec appt....pt ok and aware

## 2013-11-20 ENCOUNTER — Other Ambulatory Visit: Payer: Self-pay

## 2013-11-20 DIAGNOSIS — D473 Essential (hemorrhagic) thrombocythemia: Secondary | ICD-10-CM

## 2013-11-20 MED ORDER — HYDROXYUREA 500 MG PO CAPS: 500.0000 mg | ORAL_CAPSULE | Freq: Every day | ORAL | Status: DC

## 2014-02-08 ENCOUNTER — Other Ambulatory Visit (HOSPITAL_BASED_OUTPATIENT_CLINIC_OR_DEPARTMENT_OTHER): Payer: Medicare Other

## 2014-02-08 DIAGNOSIS — D473 Essential (hemorrhagic) thrombocythemia: Secondary | ICD-10-CM

## 2014-02-08 LAB — CBC WITH DIFFERENTIAL/PLATELET
BASO%: 0.6 % (ref 0.0–2.0)
Basophils Absolute: 0 10*3/uL (ref 0.0–0.1)
EOS ABS: 0.1 10*3/uL (ref 0.0–0.5)
EOS%: 2.6 % (ref 0.0–7.0)
HCT: 38.3 % — ABNORMAL LOW (ref 38.4–49.9)
HGB: 13.1 g/dL (ref 13.0–17.1)
LYMPH%: 32.2 % (ref 14.0–49.0)
MCH: 33.2 pg (ref 27.2–33.4)
MCHC: 34.2 g/dL (ref 32.0–36.0)
MCV: 97 fL (ref 79.3–98.0)
MONO#: 0.5 10*3/uL (ref 0.1–0.9)
MONO%: 9.1 % (ref 0.0–14.0)
NEUT#: 2.7 10*3/uL (ref 1.5–6.5)
NEUT%: 55.5 % (ref 39.0–75.0)
PLATELETS: 315 10*3/uL (ref 140–400)
RBC: 3.95 10*6/uL — AB (ref 4.20–5.82)
RDW: 13.4 % (ref 11.0–14.6)
WBC: 4.9 10*3/uL (ref 4.0–10.3)
lymph#: 1.6 10*3/uL (ref 0.9–3.3)
nRBC: 0 % (ref 0–0)

## 2014-05-03 ENCOUNTER — Ambulatory Visit: Payer: Medicare Other

## 2014-05-03 ENCOUNTER — Other Ambulatory Visit: Payer: Medicare Other

## 2014-05-28 ENCOUNTER — Other Ambulatory Visit: Payer: Self-pay | Admitting: *Deleted

## 2014-05-28 DIAGNOSIS — D473 Essential (hemorrhagic) thrombocythemia: Secondary | ICD-10-CM

## 2014-05-29 ENCOUNTER — Encounter: Payer: Self-pay | Admitting: Hematology

## 2014-05-29 ENCOUNTER — Ambulatory Visit (HOSPITAL_BASED_OUTPATIENT_CLINIC_OR_DEPARTMENT_OTHER): Payer: Medicare Other | Admitting: Hematology

## 2014-05-29 ENCOUNTER — Telehealth: Payer: Self-pay | Admitting: Hematology

## 2014-05-29 ENCOUNTER — Other Ambulatory Visit (HOSPITAL_BASED_OUTPATIENT_CLINIC_OR_DEPARTMENT_OTHER): Payer: Medicare Other

## 2014-05-29 VITALS — BP 104/57 | HR 83 | Temp 98.0°F | Resp 19 | Ht 69.0 in | Wt 142.1 lb

## 2014-05-29 DIAGNOSIS — D473 Essential (hemorrhagic) thrombocythemia: Secondary | ICD-10-CM

## 2014-05-29 LAB — CBC WITH DIFFERENTIAL/PLATELET
BASO%: 1.4 % (ref 0.0–2.0)
Basophils Absolute: 0.1 10*3/uL (ref 0.0–0.1)
EOS ABS: 0.1 10*3/uL (ref 0.0–0.5)
EOS%: 2.6 % (ref 0.0–7.0)
HCT: 41.9 % (ref 38.4–49.9)
HGB: 14.1 g/dL (ref 13.0–17.1)
LYMPH#: 1.7 10*3/uL (ref 0.9–3.3)
LYMPH%: 34 % (ref 14.0–49.0)
MCH: 33.4 pg (ref 27.2–33.4)
MCHC: 33.7 g/dL (ref 32.0–36.0)
MCV: 99.3 fL — ABNORMAL HIGH (ref 79.3–98.0)
MONO#: 0.3 10*3/uL (ref 0.1–0.9)
MONO%: 6.9 % (ref 0.0–14.0)
NEUT%: 55.1 % (ref 39.0–75.0)
NEUTROS ABS: 2.7 10*3/uL (ref 1.5–6.5)
Platelets: 342 10*3/uL (ref 140–400)
RBC: 4.22 10*6/uL (ref 4.20–5.82)
RDW: 13.7 % (ref 11.0–14.6)
WBC: 4.9 10*3/uL (ref 4.0–10.3)
nRBC: 0 % (ref 0–0)

## 2014-05-29 LAB — BASIC METABOLIC PANEL (CC13)
ANION GAP: 12 meq/L — AB (ref 3–11)
BUN: 14.3 mg/dL (ref 7.0–26.0)
CO2: 24 meq/L (ref 22–29)
CREATININE: 1.1 mg/dL (ref 0.7–1.3)
Calcium: 9.6 mg/dL (ref 8.4–10.4)
Chloride: 105 mEq/L (ref 98–109)
EGFR: 76 mL/min/{1.73_m2} — ABNORMAL LOW (ref >90)
GLUCOSE: 91 mg/dL (ref 70–140)
POTASSIUM: 4.1 meq/L (ref 3.5–5.1)
Sodium: 141 mEq/L (ref 136–145)

## 2014-05-29 MED ORDER — HYDROXYUREA 500 MG PO CAPS: 500.0000 mg | ORAL_CAPSULE | Freq: Every day | ORAL | Status: DC

## 2014-05-29 NOTE — Progress Notes (Signed)
[place], [place] [employer], [address] [place] [address]  DIAGNOSIS: Essential thrombocytosis - Plan: hydroxyurea (HYDREA) 500 MG capsule, CBC with Differential, Comprehensive metabolic panel (Cmet) - [place]  Chief Complaint  Patient presents with  . Follow-up    CURRENT THERAPY: Hydrea 500 mg daily  INTERVAL HISTORY: Ricky Santos 80 y.o. male with a history of Essential Thrombocytosis with JAK-2 mutation positive is here for follow-up.  He was last seen by Dr. Juliann Mule 6 month ago. He is tolerarting hydrea very well. He feels well overall. He denies any pain, nausea, change of bowel habits, or other symptoms. No fever, chills, night weight, weight is stable.   MEDICAL HISTORY: Past Medical History  Diagnosis Date  . Hypothyroid   . Hypertension   . GERD (gastroesophageal reflux disease)   . Prostate cancer   . Back pain   . Hernia   . Hyperlipidemia   . 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   . Essential thrombocytosis 08/26/2012    INTERIM HISTORY: has Essential thrombocytosis on his problem list.    ALLERGIES:  has No Known Allergies.  MEDICATIONS: has a current medication list which includes the following prescription(s): aspirin, atorvastatin, diltiazem, finasteride, hydrochlorothiazide, hydroxyurea, levothyroxine, mirtazapine, oxybutynin, pantoprazole, psyllium, and tamsulosin.  SURGICAL HISTORY:  Past Surgical History  Procedure Laterality Date  . Incision and drainage abscess anal  08/01/05  . Prostate surgery    . Shoulder surgery      rt  . Exp lap for trauma    . Hernia repair    . Colonoscopy  2003  . Back surgery    . Colonoscopy N/A 01/17/2013    Procedure: COLONOSCOPY;  Surgeon: Garlan Fair, MD;  Location: [facility];  Service: Endoscopy;  Laterality: N/A;    REVIEW OF SYSTEMS:   Constitutional: Denies fevers, chills or abnormal  weight loss Eyes: Denies blurriness of vision Ears, nose, mouth, throat, and face: Denies mucositis or sore throat Respiratory: Denies cough, dyspnea or wheezes Cardiovascular: Denies palpitation, chest discomfort or lower extremity swelling Gastrointestinal:  Denies nausea, heartburn or change in bowel habits Skin: Denies abnormal skin rashes Lymphatics: Denies new lymphadenopathy or easy bruising Neurological:Denies numbness, tingling or new weaknesses Behavioral/Psych: Mood is stable, no new changes  All other systems were reviewed with the patient and are negative.  PHYSICAL EXAMINATION: ECOG PERFORMANCE STATUS: 0 - Asymptomatic  Blood pressure 104/57, pulse 83, temperature 98 F (36.7 C), temperature source Oral, resp. rate 19, height 5\' 9"  (1.753 m), weight 142 lb 1.6 oz (64.456 kg), SpO2 100 %.  GENERAL:alert, no distress and comfortable SKIN: skin color, texture, turgor are normal, no rashes or significant lesions EYES: normal, Conjunctiva are pink and non-injected, sclera clear OROPHARYNX:no exudate, no erythema and lips, buccal mucosa, and tongue normal  NECK: supple, thyroid normal size, non-tender, without nodularity LYMPH:  no palpable lymphadenopathy in the cervical, axillary or supraclavicular LUNGS: clear to auscultation and percussion with normal breathing effort HEART: regular rate & rhythm and no murmurs and no lower extremity edema ABDOMEN:abdomen soft, non-tender and normal bowel sounds Musculoskeletal:no cyanosis of digits and no clubbing  NEURO: alert & oriented x 3 with fluent speech, no focal motor/sensory deficits   LABORATORY DATA: CBC Latest Ref Rng 05/29/2014 02/08/2014 11/16/2013  WBC 4.0 - 10.3 10e3/uL 4.9 4.9 5.1  Hemoglobin 13.0 - 17.1 g/dL 14.1 13.1 14.0  Hematocrit 38.4 - 49.9 % 41.9 38.3(L)  42.0  Platelets 140 - 400 10e3/uL 342 315 346    CMP Latest Ref Rng 05/29/2014 11/16/2013 07/19/2013  Glucose 70 - 140 mg/dl 91 96 125  BUN 7.0 - 26.0 mg/dL  14.3 13.6 11.5  Creatinine 0.7 - 1.3 mg/dL 1.1 1.3 1.0  Sodium 136 - 145 mEq/L 141 139 142  Potassium 3.5 - 5.1 mEq/L 4.1 4.2 3.7  Chloride 96 - 112 mEq/L - - -  CO2 22 - 29 mEq/L 24 26 26   Calcium 8.4 - 10.4 mg/dL 9.6 10.0 9.9  Total Protein 6.4 - 8.3 g/dL - 7.4 7.3  Total Bilirubin 0.20 - 1.20 mg/dL - 0.65 0.50  Alkaline Phos 40 - 150 U/L - 67 57  AST 5 - 34 U/L - 22 22  ALT 0 - 55 U/L - 9 11      RADIOGRAPHIC STUDIES: No results found.  ASSESSMENT: Ricky Santos 80 y.o. male with a history of Essential thrombocytosis - Plan: hydroxyurea (HYDREA) 500 MG capsule, CBC with Differential, Comprehensive metabolic panel (Cmet) - [facility]   PLAN:  1. Essential Thrombocytosis.  -We discussed that this is a myeloproliferative disorder, and there is small percentage pts will develop myelofibrosis and AML. -We also reviewed the risks of thrombosis.  -- He continues to do well on present therapy.  His plts are in 300k range most of time, and stable. Normal WBC and H/H. -continue Hydrea 500 mg daily and he is tolerating this well. -cont take aspirin 81 mg daily.  2. Follow-up.  --CBC every 2 month and RTC in 4 month   All questions were answered. The patient knows to call the clinic with any problems, questions or concerns. We can certainly see the patient much sooner if necessary.  I spent 15 minutes counseling the patient face to face. The total time spent in the appointment was 20 minutes.    Truitt Merle, MD 05/29/2014 2:07 PM.

## 2014-05-29 NOTE — Telephone Encounter (Signed)
Pt confirmed labs/ov per 12/29 POF, gave pt AVS.... KJ °

## 2014-07-04 ENCOUNTER — Other Ambulatory Visit: Payer: Self-pay | Admitting: Internal Medicine

## 2014-07-05 ENCOUNTER — Other Ambulatory Visit: Payer: Self-pay | Admitting: *Deleted

## 2014-07-05 DIAGNOSIS — D473 Essential (hemorrhagic) thrombocythemia: Secondary | ICD-10-CM

## 2014-07-05 MED ORDER — HYDROXYUREA 500 MG PO CAPS: 500.0000 mg | ORAL_CAPSULE | Freq: Every day | ORAL | Status: DC

## 2014-07-05 NOTE — Telephone Encounter (Signed)
Patient called and requested refill of Hydrea.  He never did get script for pharmacy on 05/29/14.  He is now out of medication, and this was from Dr. Boyce Medici rx.   Hydrea refilled by e-scribe using Dr. Ernestina Penna script done 05/29/14.

## 2014-07-24 ENCOUNTER — Other Ambulatory Visit (HOSPITAL_BASED_OUTPATIENT_CLINIC_OR_DEPARTMENT_OTHER): Payer: BLUE CROSS/BLUE SHIELD

## 2014-07-24 DIAGNOSIS — D473 Essential (hemorrhagic) thrombocythemia: Secondary | ICD-10-CM

## 2014-07-24 LAB — COMPREHENSIVE METABOLIC PANEL (CC13)
ALT: 7 U/L (ref 0–55)
ANION GAP: 11 meq/L (ref 3–11)
AST: 20 U/L (ref 5–34)
Albumin: 3.9 g/dL (ref 3.5–5.0)
Alkaline Phosphatase: 56 U/L (ref 40–150)
BUN: 17.5 mg/dL (ref 7.0–26.0)
CALCIUM: 9.3 mg/dL (ref 8.4–10.4)
CHLORIDE: 107 meq/L (ref 98–109)
CO2: 24 meq/L (ref 22–29)
Creatinine: 1.1 mg/dL (ref 0.7–1.3)
EGFR: 75 mL/min/{1.73_m2} — ABNORMAL LOW (ref >90)
Glucose: 87 mg/dl (ref 70–140)
Potassium: 4.1 mEq/L (ref 3.5–5.1)
Sodium: 142 mEq/L (ref 136–145)
Total Bilirubin: 0.51 mg/dL (ref 0.20–1.20)
Total Protein: 7 g/dL (ref 6.4–8.3)

## 2014-07-24 LAB — CBC WITH DIFFERENTIAL/PLATELET
BASO%: 1.3 % (ref 0.0–2.0)
Basophils Absolute: 0.1 10e3/uL (ref 0.0–0.1)
EOS%: 2.6 % (ref 0.0–7.0)
Eosinophils Absolute: 0.2 10e3/uL (ref 0.0–0.5)
HCT: 39.3 % (ref 38.4–49.9)
HGB: 12.6 g/dL — ABNORMAL LOW (ref 13.0–17.1)
LYMPH%: 23.7 % (ref 14.0–49.0)
MCH: 32.8 pg (ref 27.2–33.4)
MCHC: 32 g/dL (ref 32.0–36.0)
MCV: 102.5 fL — ABNORMAL HIGH (ref 79.3–98.0)
MONO#: 0.5 10e3/uL (ref 0.1–0.9)
MONO%: 7.7 % (ref 0.0–14.0)
NEUT#: 4 10e3/uL (ref 1.5–6.5)
NEUT%: 64.7 % (ref 39.0–75.0)
Platelets: 372 10e3/uL (ref 140–400)
RBC: 3.84 10e6/uL — ABNORMAL LOW (ref 4.20–5.82)
RDW: 14.1 % (ref 11.0–14.6)
WBC: 6.2 10e3/uL (ref 4.0–10.3)
lymph#: 1.5 10e3/uL (ref 0.9–3.3)

## 2014-09-18 ENCOUNTER — Telehealth: Payer: Self-pay | Admitting: Hematology

## 2014-09-18 ENCOUNTER — Ambulatory Visit (HOSPITAL_BASED_OUTPATIENT_CLINIC_OR_DEPARTMENT_OTHER): Payer: 59 | Admitting: Hematology

## 2014-09-18 ENCOUNTER — Other Ambulatory Visit (HOSPITAL_BASED_OUTPATIENT_CLINIC_OR_DEPARTMENT_OTHER): Payer: 59

## 2014-09-18 ENCOUNTER — Encounter: Payer: Self-pay | Admitting: Hematology

## 2014-09-18 VITALS — BP 109/56 | HR 80 | Temp 98.1°F | Resp 19 | Ht 69.0 in | Wt 146.5 lb

## 2014-09-18 DIAGNOSIS — D473 Essential (hemorrhagic) thrombocythemia: Secondary | ICD-10-CM | POA: Diagnosis not present

## 2014-09-18 DIAGNOSIS — D649 Anemia, unspecified: Secondary | ICD-10-CM

## 2014-09-18 LAB — FERRITIN CHCC: Ferritin: 79 ng/ml (ref 22–316)

## 2014-09-18 LAB — IRON AND TIBC CHCC
%SAT: 36 % (ref 20–55)
IRON: 104 ug/dL (ref 42–163)
TIBC: 293 ug/dL (ref 202–409)
UIBC: 189 ug/dL (ref 117–376)

## 2014-09-18 LAB — COMPREHENSIVE METABOLIC PANEL (CC13)
ALT: 10 U/L (ref 0–55)
ANION GAP: 12 meq/L — AB (ref 3–11)
AST: 21 U/L (ref 5–34)
Albumin: 3.6 g/dL (ref 3.5–5.0)
Alkaline Phosphatase: 51 U/L (ref 40–150)
BILIRUBIN TOTAL: 0.53 mg/dL (ref 0.20–1.20)
BUN: 14.4 mg/dL (ref 7.0–26.0)
CALCIUM: 8.6 mg/dL (ref 8.4–10.4)
CO2: 19 mEq/L — ABNORMAL LOW (ref 22–29)
CREATININE: 1.1 mg/dL (ref 0.7–1.3)
Chloride: 106 mEq/L (ref 98–109)
EGFR: 77 mL/min/{1.73_m2} — ABNORMAL LOW (ref >90)
GLUCOSE: 104 mg/dL (ref 70–140)
Potassium: 3.6 mEq/L (ref 3.5–5.1)
Sodium: 137 mEq/L (ref 136–145)
Total Protein: 6.3 g/dL — ABNORMAL LOW (ref 6.4–8.3)

## 2014-09-18 LAB — LACTATE DEHYDROGENASE (CC13): LDH: 191 U/L (ref 125–245)

## 2014-09-18 LAB — CBC WITH DIFFERENTIAL/PLATELET
BASO%: 1.2 % (ref 0.0–2.0)
Basophils Absolute: 0.1 10*3/uL (ref 0.0–0.1)
EOS ABS: 0.2 10*3/uL (ref 0.0–0.5)
EOS%: 3.5 % (ref 0.0–7.0)
HCT: 33.3 % — ABNORMAL LOW (ref 38.4–49.9)
HEMOGLOBIN: 10.8 g/dL — AB (ref 13.0–17.1)
LYMPH%: 31.9 % (ref 14.0–49.0)
MCH: 33.2 pg (ref 27.2–33.4)
MCHC: 32.5 g/dL (ref 32.0–36.0)
MCV: 101.9 fL — ABNORMAL HIGH (ref 79.3–98.0)
MONO#: 0.4 10*3/uL (ref 0.1–0.9)
MONO%: 8.5 % (ref 0.0–14.0)
NEUT%: 54.9 % (ref 39.0–75.0)
NEUTROS ABS: 2.4 10*3/uL (ref 1.5–6.5)
Platelets: 294 10*3/uL (ref 140–400)
RBC: 3.27 10*6/uL — ABNORMAL LOW (ref 4.20–5.82)
RDW: 13.9 % (ref 11.0–14.6)
WBC: 4.4 10*3/uL (ref 4.0–10.3)
lymph#: 1.4 10*3/uL (ref 0.9–3.3)

## 2014-09-18 LAB — RETICULOCYTES
Immature Retic Fract: 5.5 % (ref 3.00–10.60)
RBC: 3.24 10*6/uL — AB (ref 4.20–5.82)
Retic %: 2.2 % — ABNORMAL HIGH (ref 0.80–1.80)
Retic Ct Abs: 71.28 10*3/uL (ref 34.80–93.90)

## 2014-09-18 NOTE — Progress Notes (Addendum)
[place], [place] [address] [place] [address]  DIAGNOSIS: Essential thrombocytosis  Anemia, unspecified anemia type - Plan: Reticulocyte Count, Ferritin, Iron and TIBC [facility], Folate RBC, Vitamin B12, Lactate dehydrogenase (LDH) - [facility]  Chief Complaint  Patient presents with  . Follow-up    blood problem    CURRENT THERAPY: Hydrea 500 mg daily  INTERVAL HISTORY: TRICIA OAXACA 81 y.o. male with a history of Essential Thrombocytosis with JAK-2 mutation positive is here for follow-up. He was last seen by me 4 months ago. He is doing very well, denies any symptoms. He has no pain, shortness of breath. He has good energy level and appetite, weight is stable. He denies any episodes of bleeding.  MEDICAL HISTORY: Past Medical History  Diagnosis Date  . Hypothyroid   . Hypertension   . GERD (gastroesophageal reflux disease)   . Prostate cancer   . Back pain   . Hernia   . Hyperlipidemia   . 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   . Essential thrombocytosis 08/26/2012    INTERIM HISTORY: has Essential thrombocytosis on his problem list.    ALLERGIES:  has No Known Allergies.  MEDICATIONS:    Medication List       This list is accurate as of: 09/18/14  1:46 PM.  Always use your most recent med list.               aspirin 81 MG tablet  Take 81 mg by mouth daily.     atorvastatin 40 MG tablet  Commonly known as:  LIPITOR  Take 40 mg by mouth daily.     diltiazem 240 MG 24 hr capsule  Commonly known as:  CARDIZEM CD  Take 240 mg by mouth daily.     finasteride 5 MG tablet  Commonly known as:  PROSCAR  Take 5 mg by mouth daily.     hydrochlorothiazide 25 MG tablet  Commonly known as:  HYDRODIURIL  Take 25 mg by mouth daily.     hydroxyurea 500 MG capsule  Commonly known as:  HYDREA  Take 1 capsule (500 mg total) by mouth daily.     levothyroxine 25 MCG tablet  Commonly known as:  SYNTHROID, LEVOTHROID  Take 25 mcg by mouth daily.     lisinopril 20 MG tablet  Commonly known as:  PRINIVIL,ZESTRIL  Take 20 mg by mouth daily.     mirtazapine 15 MG tablet  Commonly known as:  REMERON  Take 15 mg by mouth at bedtime.     oxybutynin 5 MG tablet  Commonly known as:  DITROPAN  Take 5 mg by mouth 2 (two) times daily.     pantoprazole 40 MG tablet  Commonly known as:  PROTONIX  Take 40 mg by mouth daily.     psyllium 58.6 % powder  Commonly known as:  METAMUCIL  Take 1 packet by mouth 3 (three) times daily.     tamsulosin 0.4 MG Caps capsule  Commonly known as:  FLOMAX  Take 0.4 mg by mouth daily.        SURGICAL HISTORY:  Past Surgical History  Procedure Laterality Date  . Incision and drainage abscess anal  08/01/05  . Prostate surgery    . Shoulder surgery      rt  . Exp lap for trauma    . Hernia repair    . Colonoscopy  2003  .  Back surgery    . Colonoscopy N/A 01/17/2013    Procedure: COLONOSCOPY;  Surgeon: Garlan Fair, MD;  Location: [facility];  Service: Endoscopy;  Laterality: N/A;    REVIEW OF SYSTEMS:   Constitutional: Denies fevers, chills or abnormal weight loss Eyes: Denies blurriness of vision Ears, nose, mouth, throat, and face: Denies mucositis or sore throat Respiratory: Denies cough, dyspnea or wheezes Cardiovascular: Denies palpitation, chest discomfort or lower extremity swelling Gastrointestinal:  Denies nausea, heartburn or change in bowel habits Skin: Denies abnormal skin rashes Lymphatics: Denies new lymphadenopathy or easy bruising Neurological:Denies numbness, tingling or new weaknesses Behavioral/Psych: Mood is stable, no new changes  All other systems were reviewed with the patient and are negative.  PHYSICAL EXAMINATION: ECOG PERFORMANCE STATUS: 0 - Asymptomatic  Blood pressure 109/56, pulse 80, temperature 98.1 F (36.7 C), temperature source Oral, resp.  rate 19, height 5\' 9"  (1.753 m), weight 146 lb 8 oz (66.452 kg), SpO2 100 %.  GENERAL:alert, no distress and comfortable SKIN: skin color, texture, turgor are normal, no rashes or significant lesions EYES: normal, Conjunctiva are pink and non-injected, sclera clear OROPHARYNX:no exudate, no erythema and lips, buccal mucosa, and tongue normal  NECK: supple, thyroid normal size, non-tender, without nodularity LYMPH:  no palpable lymphadenopathy in the cervical, axillary or supraclavicular LUNGS: clear to auscultation and percussion with normal breathing effort HEART: regular rate & rhythm and no murmurs and no lower extremity edema ABDOMEN:abdomen soft, non-tender and normal bowel sounds Musculoskeletal:no cyanosis of digits and no clubbing  NEURO: alert & oriented x 3 with fluent speech, no focal motor/sensory deficits   LABORATORY DATA: CBC Latest Ref Rng 09/18/2014 07/24/2014 05/29/2014  WBC 4.0 - 10.3 10e3/uL 4.4 6.2 4.9  Hemoglobin 13.0 - 17.1 g/dL 10.8(L) 12.6(L) 14.1  Hematocrit 38.4 - 49.9 % 33.3(L) 39.3 41.9  Platelets 140 - 400 10e3/uL 294 372 342    CMP Latest Ref Rng 07/24/2014 05/29/2014 11/16/2013  Glucose 70 - 140 mg/dl 87 91 96  BUN 7.0 - 26.0 mg/dL 17.5 14.3 13.6  Creatinine 0.7 - 1.3 mg/dL 1.1 1.1 1.3  Sodium 136 - 145 mEq/L 142 141 139  Potassium 3.5 - 5.1 mEq/L 4.1 4.1 4.2  Chloride 96 - 112 mEq/L - - -  CO2 22 - 29 mEq/L 24 24 26   Calcium 8.4 - 10.4 mg/dL 9.3 9.6 10.0  Total Protein 6.4 - 8.3 g/dL 7.0 - 7.4  Total Bilirubin 0.20 - 1.20 mg/dL 0.51 - 0.65  Alkaline Phos 40 - 150 U/L 56 - 67  AST 5 - 34 U/L 20 - 22  ALT 0 - 55 U/L 7 - 9      RADIOGRAPHIC STUDIES: No results found.  ASSESSMENT: [name] 81 y.o. male with a history of Essential thrombocytosis  Anemia, unspecified anemia type - Plan: Reticulocyte Count, Ferritin, Iron and TIBC [facility], Folate RBC, Vitamin B12, Lactate dehydrogenase (LDH) - [facility]   PLAN:  1. Essential Thrombocytosis.  -We  discussed that this is a myeloproliferative disorder, and there is small percentage pts will develop myelofibrosis and AML. -We also reviewed the risks of thrombosis.  -- He continues to do well on present therapy.  His plts are in 300k range most of time, and stable. . -continue Hydrea 500 mg daily and he is tolerating this well. -cont take aspirin 81 mg daily.  2. Anemia -New since 2 months ago. His hemoglobin slightly trending down, template 8 today. He was 14 at 4 months ago. -possibly related to  Hydrea. His MCV was normal for months ago, no increased. -His ferritin was 27 2 years ago, I'll repeat his serum iron, TIBC, ferritin, folic acid and E33 levels today.  2. Follow-up.  --2 months with repeated CBC, for follow up of his anemia   All questions were answered. The patient knows to call the clinic with any problems, questions or concerns. We can certainly see the patient much sooner if necessary.  I spent 20 minutes counseling the patient face to face. The total time spent in the appointment was 25 minutes.    Truitt Merle, MD 09/18/2014 1:46 PM.  Addendum His folic acid, I35, iron study and ferritin were normal from yesterday. I left a message to pt's cell phone.  Truitt Merle  09/19/2014

## 2014-09-18 NOTE — Telephone Encounter (Signed)
gave and printed appt sched and avs for pt for JUNE  °

## 2014-09-19 LAB — VITAMIN B12: Vitamin B-12: 482 pg/mL (ref 211–911)

## 2014-09-19 LAB — FOLATE RBC: RBC Folate: 582 ng/mL (ref >280)

## 2014-11-20 ENCOUNTER — Ambulatory Visit (HOSPITAL_BASED_OUTPATIENT_CLINIC_OR_DEPARTMENT_OTHER): Payer: 59 | Admitting: Hematology

## 2014-11-20 ENCOUNTER — Other Ambulatory Visit (HOSPITAL_BASED_OUTPATIENT_CLINIC_OR_DEPARTMENT_OTHER): Payer: 59

## 2014-11-20 ENCOUNTER — Encounter: Payer: Self-pay | Admitting: Hematology

## 2014-11-20 ENCOUNTER — Telehealth: Payer: Self-pay | Admitting: Hematology

## 2014-11-20 VITALS — BP 110/72 | HR 83 | Temp 98.2°F | Resp 18 | Ht 69.0 in | Wt 144.2 lb

## 2014-11-20 DIAGNOSIS — R42 Dizziness and giddiness: Secondary | ICD-10-CM | POA: Diagnosis not present

## 2014-11-20 DIAGNOSIS — D473 Essential (hemorrhagic) thrombocythemia: Secondary | ICD-10-CM | POA: Diagnosis not present

## 2014-11-20 DIAGNOSIS — D649 Anemia, unspecified: Secondary | ICD-10-CM

## 2014-11-20 LAB — CBC WITH DIFFERENTIAL/PLATELET
BASO%: 1.1 % (ref 0.0–2.0)
BASOS ABS: 0 10*3/uL (ref 0.0–0.1)
EOS ABS: 0.1 10*3/uL (ref 0.0–0.5)
EOS%: 3.1 % (ref 0.0–7.0)
HCT: 35 % — ABNORMAL LOW (ref 38.4–49.9)
HEMOGLOBIN: 11.6 g/dL — AB (ref 13.0–17.1)
LYMPH#: 1.4 10*3/uL (ref 0.9–3.3)
LYMPH%: 31 % (ref 14.0–49.0)
MCH: 34.1 pg — ABNORMAL HIGH (ref 27.2–33.4)
MCHC: 33.3 g/dL (ref 32.0–36.0)
MCV: 102.6 fL — AB (ref 79.3–98.0)
MONO#: 0.4 10*3/uL (ref 0.1–0.9)
MONO%: 8.4 % (ref 0.0–14.0)
NEUT#: 2.5 10*3/uL (ref 1.5–6.5)
NEUT%: 56.4 % (ref 39.0–75.0)
Platelets: 322 10*3/uL (ref 140–400)
RBC: 3.41 10*6/uL — ABNORMAL LOW (ref 4.20–5.82)
RDW: 13.7 % (ref 11.0–14.6)
WBC: 4.5 10*3/uL (ref 4.0–10.3)

## 2014-11-20 LAB — COMPREHENSIVE METABOLIC PANEL (CC13)
ALK PHOS: 49 U/L (ref 40–150)
ALT: 10 U/L (ref 0–55)
ANION GAP: 6 meq/L (ref 3–11)
AST: 20 U/L (ref 5–34)
Albumin: 3.7 g/dL (ref 3.5–5.0)
BILIRUBIN TOTAL: 0.6 mg/dL (ref 0.20–1.20)
BUN: 20.1 mg/dL (ref 7.0–26.0)
CALCIUM: 9.2 mg/dL (ref 8.4–10.4)
CO2: 25 mEq/L (ref 22–29)
CREATININE: 1.3 mg/dL (ref 0.7–1.3)
Chloride: 109 mEq/L (ref 98–109)
EGFR: 61 mL/min/{1.73_m2} — ABNORMAL LOW (ref >90)
GLUCOSE: 86 mg/dL (ref 70–140)
Potassium: 4.3 mEq/L (ref 3.5–5.1)
Sodium: 140 mEq/L (ref 136–145)
Total Protein: 6.3 g/dL — ABNORMAL LOW (ref 6.4–8.3)

## 2014-11-20 NOTE — Progress Notes (Signed)
[place], [place] [address] [place] [address]  DIAGNOSIS: No diagnosis found.  Chief Complaint  Patient presents with  . Follow-up    CURRENT THERAPY: Hydrea 500 mg daily  INTERVAL HISTORY: Ricky Santos 81 y.o. male with a history of Essential Thrombocytosis with JAK-2 mutation positive is here for follow-up. He noticed dizziness when he gets up in the past 2 days, no chest pain, or dyspnea. He states he is drinking and eating as usual, no diarrhea, fever or other new complains, he does go outside quite often and the weather is warm lately.Marland Kitchen He states he had a similar episode 34 years ago, and it resolved spontaneously.   MEDICAL HISTORY: Past Medical History  Diagnosis Date  . Hypothyroid   . Hypertension   . GERD (gastroesophageal reflux disease)   . Prostate cancer   . Back pain   . Hernia   . Hyperlipidemia   . 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   . Essential thrombocytosis 08/26/2012    INTERIM HISTORY: has Essential thrombocytosis on his problem list.    ALLERGIES:  has No Known Allergies.  MEDICATIONS:    Medication List       This list is accurate as of: 11/20/14  2:50 PM.  Always use your most recent med list.               aspirin 81 MG tablet  Take 81 mg by mouth daily.     atorvastatin 40 MG tablet  Commonly known as:  LIPITOR  Take 40 mg by mouth daily.     diltiazem 240 MG 24 hr capsule  Commonly known as:  CARDIZEM CD  Take 240 mg by mouth daily.     finasteride 5 MG tablet  Commonly known as:  PROSCAR  Take 5 mg by mouth daily.     hydrochlorothiazide 25 MG tablet  Commonly known as:  HYDRODIURIL  Take 25 mg by mouth daily.     hydroxyurea 500 MG capsule  Commonly known as:  HYDREA  Take 1 capsule (500 mg total) by mouth daily.     levothyroxine 25 MCG tablet  Commonly known as:  SYNTHROID, LEVOTHROID  Take  25 mcg by mouth daily.     lisinopril 20 MG tablet  Commonly known as:  PRINIVIL,ZESTRIL  Take 20 mg by mouth daily.     mirtazapine 15 MG tablet  Commonly known as:  REMERON  Take 15 mg by mouth at bedtime.     oxybutynin 5 MG tablet  Commonly known as:  DITROPAN  Take 5 mg by mouth 2 (two) times daily.     pantoprazole 40 MG tablet  Commonly known as:  PROTONIX  Take 40 mg by mouth daily.     psyllium 58.6 % powder  Commonly known as:  METAMUCIL  Take 1 packet by mouth 3 (three) times daily.     tamsulosin 0.4 MG Caps capsule  Commonly known as:  FLOMAX  Take 0.4 mg by mouth daily.        SURGICAL HISTORY:  Past Surgical History  Procedure Laterality Date  . Incision and drainage abscess anal  08/01/05  . Prostate surgery    . Shoulder surgery      rt  . Exp lap for trauma    . Hernia repair    . Colonoscopy  2003  . Back surgery    .  Colonoscopy N/A 01/17/2013    Procedure: COLONOSCOPY;  Surgeon: Garlan Fair, MD;  Location: [facility];  Service: Endoscopy;  Laterality: N/A;    REVIEW OF SYSTEMS:   Constitutional: Denies fevers, chills or abnormal weight loss Eyes: Denies blurriness of vision Ears, nose, mouth, throat, and face: Denies mucositis or sore throat Respiratory: Denies cough, dyspnea or wheezes Cardiovascular: Denies palpitation, chest discomfort or lower extremity swelling Gastrointestinal:  Denies nausea, heartburn or change in bowel habits Skin: Denies abnormal skin rashes Lymphatics: Denies new lymphadenopathy or easy bruising Neurological:Denies numbness, tingling or new weaknesses Behavioral/Psych: Mood is stable, no new changes  All other systems were reviewed with the patient and are negative.  PHYSICAL EXAMINATION: ECOG PERFORMANCE STATUS:1   Blood pressure 110/72, pulse 83, temperature 98.2 F (36.8 C), temperature source Oral, resp. rate 18, height 5\' 9"  (1.753 m), weight 144 lb 3.2 oz (65.409 kg), SpO2 100 %. His initial  blood pressure was 90/60 when he came in with heart rate 82. GENERAL:alert, no distress and comfortable SKIN: skin color, texture, turgor are normal, no rashes or significant lesions EYES: normal, Conjunctiva are pink and non-injected, sclera clear OROPHARYNX:no exudate, no erythema and lips, buccal mucosa, and tongue normal  NECK: supple, thyroid normal size, non-tender, without nodularity LYMPH:  no palpable lymphadenopathy in the cervical, axillary or supraclavicular LUNGS: clear to auscultation and percussion with normal breathing effort HEART: regular rate & rhythm and no murmurs and no lower extremity edema ABDOMEN:abdomen soft, non-tender and normal bowel sounds Musculoskeletal:no cyanosis of digits and no clubbing  NEURO: alert & oriented x 3 with fluent speech, no focal motor/sensory deficits   LABORATORY DATA: CBC Latest Ref Rng 11/20/2014 09/18/2014 07/24/2014  WBC 4.0 - 10.3 10e3/uL 4.5 4.4 6.2  Hemoglobin 13.0 - 17.1 g/dL 11.6(L) 10.8(L) 12.6(L)  Hematocrit 38.4 - 49.9 % 35.0(L) 33.3(L) 39.3  Platelets 140 - 400 10e3/uL 322 294 372    CMP Latest Ref Rng 11/20/2014 09/18/2014 07/24/2014  Glucose 70 - 140 mg/dl 86 104 87  BUN 7.0 - 26.0 mg/dL 20.1 14.4 17.5  Creatinine 0.7 - 1.3 mg/dL 1.3 1.1 1.1  Sodium 136 - 145 mEq/L 140 137 142  Potassium 3.5 - 5.1 mEq/L 4.3 3.6 4.1  Chloride 96 - 112 mEq/L - - -  CO2 22 - 29 mEq/L 25 19(L) 24  Calcium 8.4 - 10.4 mg/dL 9.2 8.6 9.3  Total Protein 6.4 - 8.3 g/dL 6.3(L) 6.3(L) 7.0  Total Bilirubin 0.20 - 1.20 mg/dL 0.60 0.53 0.51  Alkaline Phos 40 - 150 U/L 49 51 56  AST 5 - 34 U/L 20 21 20   ALT 0 - 55 U/L 10 10 7       RADIOGRAPHIC STUDIES: No results found.  ASSESSMENT: Ricky Santos 81 y.o. male with a history of No diagnosis found.   PLAN:  1. Essential Thrombocytosis.  -We discussed that this is a myeloproliferative disorder, and there is small percentage pts will develop myelofibrosis and AML.  -We also reviewed the risks  of thrombosis.  -- He continues to do well on present therapy.  His plts are in 300k range most of time, and stable. . -continue Hydrea 500 mg daily and he is tolerating this well. -cont take aspirin 81 mg daily.  2. Anemia -Developed in the past 4-5 months -possibly related to Hydrea. His MCV was normal 4 months ago, no increased. -His folic acid, P91, iron studies and ferritin were normal.  -Hb 11.6 today, continue monitoring   3. Borderline  hypotension and dizziness -His blood pressure was 90/6o when he came in, repeated blood pressure was better. I think his positional dizziness is likely related to his borderline hypertension -I told him to stop hydrochlorothiazide for now, monitor his blood pressure at home, call his primary care physician Dr. Lysle Rubens and to see him in a few weeks -I encouraged him to drink fluids adequately, we gave him a big cup of juice and he finished before he left the office.  Follow-up.  -4 months with repeated CBC, for follow up of his anemia   All questions were answered. The patient knows to call the clinic with any problems, questions or concerns. We can certainly see the patient much sooner if necessary.  I spent 20 minutes counseling the patient face to face. The total time spent in the appointment was 25 minutes.    Truitt Merle, MD 11/20/2014 2:50 PM.

## 2014-11-20 NOTE — Telephone Encounter (Signed)
Gave and printed appt sched and avs fo rpt for OCT °

## 2015-02-12 ENCOUNTER — Ambulatory Visit: Payer: Medicare Other | Attending: Internal Medicine

## 2015-02-12 DIAGNOSIS — R531 Weakness: Secondary | ICD-10-CM

## 2015-02-12 DIAGNOSIS — M6281 Muscle weakness (generalized): Secondary | ICD-10-CM | POA: Insufficient documentation

## 2015-02-12 DIAGNOSIS — R269 Unspecified abnormalities of gait and mobility: Secondary | ICD-10-CM

## 2015-02-12 DIAGNOSIS — R42 Dizziness and giddiness: Secondary | ICD-10-CM

## 2015-02-12 NOTE — Patient Instructions (Signed)
Gaze Stabilization: Tip Card 1.Target must remain in focus, not blurry, and appear stationary while head is in motion. 2.Perform exercises with small head movements (45 to either side of midline). 3.Increase speed of head motion so long as target is in focus. 4.If you wear eyeglasses, be sure you can see target through lens (therapist will give specific instructions for bifocal / progressive lenses). 5.These exercises may provoke dizziness or nausea. Work through these symptoms. If too dizzy, slow head movement slightly. Rest between each exercise. 6.Exercises demand concentration; avoid distractions. 7.For safety, perform standing exercises close to a counter, wall, corner, or next to someone.  Copyright  [employer]. All rights reserved.  Gaze Stabilization: Sitting   Keeping eyes on target on wall \\_10N  feet away, tilt head down 15-30 and move head side to side for ____ seconds. Repeat while moving head up and down for ____ seconds. Do ____ sessions per day. Repeat using target on pattern background.  Copyright  V[employer]. All rights reserved.

## 2015-02-12 NOTE — Therapy (Addendum)
[facility] [address] [place], [place], [address] Phone: [phone]   Fax:  [phone]  Physical Therapy Evaluation  Patient Details  Name: Ricky Santos MRN: [id number] Date of Birth: [birth year] Referring Provider:  Wenda Low, MD  Encounter Date: 02/12/2015      PT End of Session - 02/12/15 2112    Visit Number 1   Number of Visits 9   Date for PT Re-Evaluation 03/14/15   Authorization Type G-code every 10th visit   PT Start Time 1447   PT Stop Time 1543   PT Time Calculation (min) 56 min   Equipment Utilized During Treatment Gait belt   Activity Tolerance Patient tolerated treatment well   Behavior During Therapy [facility] for tasks assessed/performed      Past Medical History  Diagnosis Date  . Hypothyroid   . Hypertension   . GERD (gastroesophageal reflux disease)   . Prostate cancer   . Back pain   . Hernia   . Hyperlipidemia   . 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   . Essential thrombocytosis 08/26/2012    Past Surgical History  Procedure Laterality Date  . Incision and drainage abscess anal  08/01/05  . Prostate surgery    . Shoulder surgery      rt  . Exp lap for trauma    . Hernia repair    . Colonoscopy  2003  . Back surgery    . Colonoscopy N/A 01/17/2013    Procedure: COLONOSCOPY;  Surgeon: Garlan Fair, MD;  Location: [facility];  Service: Endoscopy;  Laterality: N/A;    There were no vitals filed for this visit.  Visit Diagnosis:  Dizziness and giddiness - Plan: PT plan of care cert/re-cert  Abnormality of gait - Plan: PT plan of care cert/re-cert  Decreased strength - Plan: PT plan of care cert/re-cert   **Pt goes by JT.     Subjective Assessment - 02/12/15 1455    Subjective Pt reported dizziness started about 10 days ago, and pt thought it could have been due to incr. BP (about a month ago) but BP is now  controlled with medication prescribed from Dr. Lysle Rubens and pt is still dizzy.Pt reported he typically experiences a headache and his whole body feels numb when dizziness is 10/10. Symptoms are worse when he's driving, looking up, performing sit to stand, and balance has been "off" during amb (leans side to side).  Pt describes dizziness as "not really spinning" but more lightheadedness. Pt reports he feels lightheadedness during sit to stand and requires 30 seconds static standing before dizziness subsides. Pt has had to pull  over on the side of the road due to "everything was moving too fast".    Pertinent History Hx of prostate CA, essential thrombocytosis   Patient Stated Goals Stop the dizziness and improve balance   Currently in Pain? No/denies            [facility] PT Assessment - 02/12/15 1503    Assessment   Medical Diagnosis BPV   Onset Date/Surgical Date 02/02/15   Prior Therapy none   Precautions   Precautions Fall   Precaution Comments based on DGI and TUG scores   Restrictions   Weight Bearing Restrictions No   Balance Screen   Has the patient fallen in the past 6 months No  but pt has had "close calls"   Has the patient had a decrease in activity  level because of a fear of falling?  Yes   Is the patient reluctant to leave their home because of a fear of falling?  No   Home Ecologist residence   Living Arrangements Spouse/significant other;Other (Comment)  and grandchildren (5 and 64)   Available Help at Discharge Family   Type of [place] to enter   Entrance Stairs-Number of Steps 2   Entrance Stairs-Rails Right  and wall on the L side   Home Layout One level   Home Equipment None;Grab bars - tub/shower   Prior Function   Level of Independence Independent   Vocation Part time employment   Vocation Requirements Delivery driver    Leisure Take care of grandchildren, play golf, watching football   Cognition    Overall Cognitive Status Impaired/Different from baseline   Area of Impairment Memory   Memory Decreased short-term memory   Posture/Postural Control   Posture/Postural Control Postural limitations   Postural Limitations Rounded Shoulders;Forward head;Increased thoracic kyphosis   ROM / Strength   AROM / PROM / Strength AROM;Strength   AROM   Overall AROM  Within functional limits for tasks performed   Strength   Overall Strength Deficits   Overall Strength Comments B hip abd: 3/5 and hamstrings 3+/5, otherwise strength WFL.   Transfers   Transfers Sit to Stand;Stand to Sit   Sit to Stand 5: Supervision;With armrests;From chair/3-in-1   Stand to Sit 5: Supervision;With armrests;To chair/3-in-1   Ambulation/Gait   Ambulation/Gait Yes   Ambulation/Gait Assistance 4: Min assist;4: Min guard;5: Supervision   Ambulation/Gait Assistance Details Pt required min A to maintain balance when traversing corners due to narrow BOS/crossing LEs and when performing head turns and 180 degree turns.   Ambulation Distance (Feet) 200 Feet   Assistive device None   Gait Pattern Step-through pattern;Decreased stride length;Decreased dorsiflexion - right;Decreased dorsiflexion - left;Narrow base of support  assess for genu valgus next session   Ambulation Surface Level;Indoor   Gait velocity 3.32ft/sec.  no AD   Standardized Balance Assessment   Standardized Balance Assessment Dynamic Gait Index;Timed Up and Go Test   Dynamic Gait Index   Level Surface Normal   Change in Gait Speed Mild Impairment   Gait with Horizontal Head Turns Severe Impairment   Gait with Vertical Head Turns Severe Impairment   Gait and Pivot Turn Severe Impairment   Step Over Obstacle Mild Impairment   Step Around Obstacles Moderate Impairment   Steps Normal   Total Score 11   Timed Up and Go Test   TUG Normal TUG   Normal TUG (seconds) 14.16  no AD            Vestibular Assessment - 02/12/15 0001    Vestibular  Assessment   General Observation Dizziness at its worst: 10/10, Current: 5/10, Best: 5/10.   Symptom Behavior   Type of Dizziness Lightheadedness   Frequency of Dizziness Constant, it subsides but never goes away.   Duration of Dizziness Constant, at it lasts 30 seconds   Aggravating Factors Driving;Sit to stand;Comment  pt feels like he has to catch himself from falling forward   Relieving Factors Rest  stoping activity   Occulomotor Exam   Occulomotor Alignment Normal   Head shaking Horizontal Absent   Head Shaking Vertical Absent   Smooth Pursuits Saccades  and difficult for pt to follow pen (per pt report)   Saccades Poor trajectory;Comment  overshooting (R side)  difficult to perform, pt kept blinking   Vestibulo-Occular Reflex   Comment L Corrective saccades during head thrust, but pt reported 0/10 after VOR.   Positional Testing   Dix-Hallpike Dix-Hallpike Right;Dix-Hallpike Left   Dix-Hallpike Right   Dix-Hallpike Right Symptoms No nystagmus;Other (comment)  pt reported seeing "black spots" after 30 sec. in cx ext/rot   Dix-Hallpike Left   Dix-Hallpike Left Symptoms No nystagmus   Positional Sensitivities   Positional Sensitivities Comments sit to stand provoked symptoms and pt had to wait 10-30 seconds in standing prior to attempting to walk.                Vestibular Treatment/Exercise - 02/12/15 0001    Vestibular Treatment/Exercise   Vestibular Treatment Provided Gaze   Habituation Exercises Seated Horizontal Head Turns   Gaze Exercises X1 Viewing Horizontal   Seated Horizontal Head Turns   Number of Reps  3   Symptom Description  Pt performed 3 sets of 20-30 seconds. Symptoms increased but decreased when PT instructed pt to decr. speed of head movement. Provided as HEP. See HEP for details in pt instructions.                PT Education - 02/12/15 2110    Education provided Yes   Education Details Gaze stabilization HEP. PT also discussed  outcome measure results and possible causes of dizziness, including vascular causes based on pt reporting seeing black spots with neck in extension/rotation/lateral flexion.   Person(s) Educated Patient   Methods Explanation;Demonstration;Verbal cues;Handout   Comprehension Returned demonstration;Verbalized understanding          PT Short Term Goals - 02/12/15 2119    PT SHORT TERM GOAL #1   Title same as LTGs           PT Long Term Goals - 02/12/15 2119    PT LONG TERM GOAL #1   Title Pt will report baseline dizziness is 0/10 and does not go >2/10 during sit to stands in order to perform ADLs safely. Target date: 03/12/15.   Status New   PT LONG TERM GOAL #2   Title Pt will improve DGI score to >/=20/24 to decrease falls risk. Target date: 03/12/15.   Status New   PT LONG TERM GOAL #3   Title Pt will perform TUG with no AD in </=13.5sec. to decrease falls risk. Target date: 03/12/15.   Status New   PT LONG TERM GOAL #4   Title Pt will report 0/10 dizziness while driving in order to drive safely. Target date: 03/12/15.   Status New   PT LONG TERM GOAL #5   Title Perform SOT and write goal if appropriate. Target date: 03/12/15.   Status New               Plan - 02/12/15 2112    Clinical Impression Statement Pt is a pleasant 81y/o male presenting to OPPT neuro with dizziness, which pt describes as lightheadedness. Pt presented with impaired balance, decreased strength, dizziness, gait deviations, and impaired posture. Pt's DGI score and TUG time indicate he is at a risk for falls. Pt experienced increased symptoms during VOR, difficulty performing R-sided saccades (over-shooting), and corrective saccades during L head thrust. Pt was negative for nystagmus and symptoms during R and L Dix-Hallpike. However, pt did report "seeing black spots" and felt lightheaded in supine with neck rotated to the R side, extended and laterally flexed to. Onset of seeing spots occured after  approx. 30 seconds in  this position, which could indicate a vascular issue. PT will finish assessing vestibular system next session.    Pt will benefit from skilled therapeutic intervention in order to improve on the following deficits Abnormal gait;Postural dysfunction;Dizziness;Decreased mobility;Decreased balance;Decreased knowledge of use of DME;Decreased strength   Rehab Potential Good   Clinical Impairments Affecting Rehab Potential dizziness could be vascular in nature   PT Frequency 2x / week   PT Duration 4 weeks   PT Treatment/Interventions ADLs/Self Care Home Management;Neuromuscular re-education;Vestibular;Balance training;Manual techniques;Therapeutic exercise;Therapeutic activities;Functional mobility training;Stair training;Gait training;Canalith Repostioning;DME Instruction;Patient/family education;Biofeedback   PT Next Visit Plan Assess orthostatic hypotension, perform VOR cancellation, perform SOT and initiate balance HEP.   Consulted and Agree with Plan of Care Patient          G-Codes - 02/20/2015 September 07, 2121    Functional Assessment Tool Used DGI: 11/24; TUG no AD: 14.16sec.   Functional Limitation Mobility: Walking and moving around   Mobility: Walking and Moving Around Current Status [phone]) At least 40 percent but less than 60 percent impaired, limited or restricted   Mobility: Walking and Moving Around Goal Status [phone]) At least 1 percent but less than 20 percent impaired, limited or restricted       Problem List Patient Active Problem List   Diagnosis Date Noted  . Essential thrombocytosis 08/26/2012    Anaisabel Pederson L 2015-02-20, 9:34 PM  [facility] [address] [place], [place], [address] Phone: [phone]   Fax:  [phone]    Geoffry Paradise, PT,DPT 2015-02-20 9:34 PM Phone: [phone] Fax: [phone]

## 2015-02-19 ENCOUNTER — Ambulatory Visit: Payer: Medicare Other | Admitting: Rehabilitative and Restorative Service Providers"

## 2015-02-19 DIAGNOSIS — R42 Dizziness and giddiness: Secondary | ICD-10-CM | POA: Diagnosis not present

## 2015-02-19 DIAGNOSIS — R269 Unspecified abnormalities of gait and mobility: Secondary | ICD-10-CM

## 2015-02-19 NOTE — Patient Instructions (Signed)
Feet Apart, Varied Arm Positions - Eyes Closed   Stand with feet shoulder width apart and arms out. Close eyes and visualize upright position. Hold __20__ seconds. Repeat __3__ times per session. Do __2__ sessions per day.  Copyright  VHI. All rights reserved.  Feet Together, Varied Arm Positions - Eyes Closed   Stand with feet together and arms out. Close eyes and visualize upright position. Hold _20 ___ seconds. Repeat __3__ times per session. Do _2___ sessions per day.  Copyright  VHI. All rights reserved.  Feet Apart, Head Motion - Eyes Open   With eyes open, feet apart, move head slowly: side to side x 5 times, rest and repeat. Do _2___ sessions per day.  Copyright  VHI. All rights reserved.  Feet Apart (Compliant Surface) Varied Arm Positions - Eyes Open   With eyes open, standing on compliant surface: ___pillow_____, feet shoulder width apart and arms at side, look at a stationary object. Hold _30___ seconds. Repeat __3__ times per session. Do __2__ sessions per day.  Copyright  VHI. All rights reserved.

## 2015-02-19 NOTE — Therapy (Signed)
[facility] [address] [place], [place], [address] Phone: [phone]   Fax:  [phone]  Physical Therapy Treatment  Patient Details  Name: JACQUISE RARICK MRN: [id number] Date of Birth: 1933/06/29 Referring [place]:  Wenda Low, MD  Encounter Date: 02/19/2015      PT End of Session - 02/19/15 2130    Visit Number 2   Number of Visits 9   Date for PT Re-Evaluation 03/14/15   Authorization Type G-code every 10th visit   PT Start Time 1329   PT Stop Time 1410   PT Time Calculation (min) 41 min   Equipment Utilized During Treatment --   Activity Tolerance Patient tolerated treatment well   Behavior During Therapy [facility] for tasks assessed/performed      Past Medical History  Diagnosis Date  . Hypothyroid   . Hypertension   . GERD (gastroesophageal reflux disease)   . Prostate cancer   . Back pain   . Hernia   . Hyperlipidemia   . 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   . Essential thrombocytosis 08/26/2012    Past Surgical History  Procedure Laterality Date  . Incision and drainage abscess anal  08/01/05  . Prostate surgery    . Shoulder surgery      rt  . Exp lap for trauma    . Hernia repair    . Colonoscopy  2003  . Back surgery    . Colonoscopy N/A 01/17/2013    Procedure: COLONOSCOPY;  Surgeon: Garlan Fair, MD;  [facility]: [facility];  Service: Endoscopy;  Laterality: N/A;    There were no vitals filed for this visit.  Visit Diagnosis:  Dizziness and giddiness  Abnormality of gait      Subjective Assessment - 02/19/15 1329    Subjective The patient reports constant dizziness.    He feels his balance is getting worse.     Patient Stated Goals Stop the dizziness and improve balance   Currently in Pain? No/denies          Vestibular Assessment - 02/19/15 1335    Positional Testing   Sidelying Test Sidelying Right;Sidelying  Left   Orthostatics   BP supine (x 5 minutes) 140/72 mmHg   HR supine (x 5 minutes) 85   BP standing (after 1 minute) 138/80 mmHg  dizziness 5/10   HR standing (after 1 minute) 101   BP standing (after 3 minutes) 130/86 mmHg   HR standing (after 3 minutes) 92   Orthostatics Comment dizziness remains the same during standing          [facility] Adult PT Treatment/Exercise - 02/19/15 1337    Standardized Balance Assessment   Standardized Balance Assessment Balance Master Testing   High Level Balance   High Level Balance Comments Patient scored 46% on sensory organization testing with WNLs use of somatosensory and visual inputs and significantly impaired use of vestibular inputs with "falls" on conditions 5 and 6.  Age/height norm is 65%.   Neuro Re-ed    Neuro Re-ed Details  Corner balance exercises with head turns, eyes closed and on pillow (attempted eyes closed and head turns, but too challenging on compliant surfaces)                PT Education - 02/19/15 2133    Education provided Yes   Education Details HEP: feet apart>feet together progression for eyes closed, head turns with eyes open, foam with  eyes open.   Person(s) Educated Patient   Methods Explanation;Demonstration;Handout   Comprehension Verbalized understanding;Returned demonstration          PT Short Term Goals - 02/12/15 2119    PT SHORT TERM GOAL #1   Title same as LTGs           PT Long Term Goals - 02/19/15 2131    PT LONG TERM GOAL #1   Title Pt will report baseline dizziness is 0/10 and does not go >2/10 during sit to stands in order to perform ADLs safely. Target date: 03/12/15.   Status On-going   PT LONG TERM GOAL #2   Title Pt will improve DGI score to >/=20/24 to decrease falls risk. Target date: 03/12/15.   Status On-going   PT LONG TERM GOAL #3   Title Pt will perform TUG with no AD in </=13.5sec. to decrease falls risk. Target date: 03/12/15.   Status On-going   PT LONG TERM GOAL  #4   Title Pt will report 0/10 dizziness while driving in order to drive safely. Target date: 03/12/15.   Status On-going   PT LONG TERM GOAL #5   Title Perform SOT and write goal if appropriate. Target date: 03/12/15.   Baseline SOT assessed on 02/19/2015   Status Achieved   Additional Long Term Goals   Additional Long Term Goals Yes   PT LONG TERM GOAL #6   Title The patient will improve sensory organization testing from 46% to > or equal to 60% to demo improving use of sensory systems for balance.   Baseline Target date 03/12/15   Time 4   Period Weeks   Status New               Plan - 02/19/15 2135    Clinical Impression Statement The patient has decresed use of vestibular inputs on sensory organization testing with "falls" on conditions 5 and 6 in multi-sensory environments in which the vestibular system is providing primary input.  PT began HEP to address balance deficits.  Patient's complaints may have multiple contribuitng factors.  PT to address issues of balance and will continue to assess dizziness with movements.   PT Next Visit Plan Perform VOR cancellation, check HEP and progress to patient tolerance.        Problem List Patient Active Problem List   Diagnosis Date Noted  . Essential thrombocytosis 08/26/2012    WEAVER,CHRISTINA, PT 02/19/2015, 9:37 PM  [facility] [address] [employer] [place], [place], [address] Phone: [phone]   Fax:  [phone]

## 2015-02-22 ENCOUNTER — Ambulatory Visit: Payer: Medicare Other | Admitting: Rehabilitative and Restorative Service Providers"

## 2015-02-22 DIAGNOSIS — R269 Unspecified abnormalities of gait and mobility: Secondary | ICD-10-CM

## 2015-02-22 DIAGNOSIS — R42 Dizziness and giddiness: Secondary | ICD-10-CM | POA: Diagnosis not present

## 2015-02-22 NOTE — Therapy (Signed)
[facility] [address] [place], [place], [address] Phone: [phone]   Fax:  [phone]  Physical Therapy Treatment  Patient Details  Name: Ricky Santos MRN: [id number] Date of Birth: 02-12-1934 Referring Provider:  Wenda Low, MD  Encounter Date: 02/22/2015      PT End of Session - 02/22/15 1628    Visit Number 3   Number of Visits 9   Date for PT Re-Evaluation 03/14/15   Authorization Type G-code every 10th visit   PT Start Time 1338   PT Stop Time 1418   PT Time Calculation (min) 40 min   Activity Tolerance Patient tolerated treatment well   Behavior During Therapy [facility] for tasks assessed/performed      Past Medical History  Diagnosis Date  . Hypothyroid   . Hypertension   . GERD (gastroesophageal reflux disease)   . Prostate cancer   . Back pain   . Hernia   . Hyperlipidemia   . 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   . Essential thrombocytosis 08/26/2012    Past Surgical History  Procedure Laterality Date  . Incision and drainage abscess anal  08/01/05  . Prostate surgery    . Shoulder surgery      rt  . Exp lap for trauma    . Hernia repair    . Colonoscopy  2003  . Back surgery    . Colonoscopy N/A 01/17/2013    Procedure: COLONOSCOPY;  Surgeon: Garlan Fair, MD;  [facility];  Service: Endoscopy;  Laterality: N/A;    There were no vitals filed for this visit.  Visit Diagnosis:  Abnormality of gait  Dizziness and giddiness      Subjective Assessment - 02/22/15 1540    Subjective The patient reports he is doing a lot better.  The HEP with feet together and eyes closed provokes dizziness.  He reports dizziness is no longer constant and feels 85% improvement.  At end of session, he notes he can decrease to 1x/week due to responding so quickly to exercises.   Patient Stated Goals Stop the dizziness and improve balance    Currently in Pain? No/denies           [facility] Adult PT Treatment/Exercise - 02/22/15 1606    Ambulation/Gait   Ambulation/Gait Yes   Ambulation/Gait Assistance 6: Modified independent (Device/Increase time)   Ambulation/Gait Assistance Details with close supervision during dynamic tasks   Ambulation Distance (Feet) 230 Feet   Assistive device None   Gait Comments Dynamic gait activities with horizontal/vertical head turns with mild dizziness noted that settles after patient stops head movements, direction changes, heel and toe walking and marching.   Self-Care   Self-Care Other Self-Care Comments   Other Self-Care Comments  Discussed posture and lumbar support during driving tasks as patient notices worsening posture during driving + increased dizziness   Neuro Re-ed    Neuro Re-ed Details  rocker board with eyes open and reaching R/L UEs x 10 reps each, head turns on rocker board, eyes closed with min A.    Sit<.stand x 10 with eyes closed and no dizziness   Exercises   Exercises Other Exercises   Other Exercises  shoulder rolls emphasizing "back and down", supine cervical retraction with cues x 5 reps, manual stretching with P/ROM into rotation, sidebending, flexion and diagonal movements for stretching with gentle manual distraction performed for relaxation prior to stretching.  Towel roll stretch  supine and seated hamstring stretching         Vestibular Treatment/Exercise - 02/22/15 1605    Vestibular Treatment/Exercise   Vestibular Treatment Provided Gaze   X1 Viewing Horizontal   Foot Position standing feet apart   Time --   30 seconds   Reps 2   Comments Patient reports minimal visual blurring, but no dizziness today.               PT Education - 02/22/15 1628    Education provided Yes   Education Details HEP: shoulder rolls, towel self mobilization thoracic spine, hamstring stretch   Person(s) Educated Patient   Methods Explanation;Demonstration;Handout    Comprehension Verbalized understanding;Returned demonstration          PT Short Term Goals - 02/12/15 2119    PT SHORT TERM GOAL #1   Title same as LTGs           PT Long Term Goals - 02/22/15 1645    PT LONG TERM GOAL #1   Title Pt will report baseline dizziness is 0/10 and does not go >2/10 during sit to stands in order to perform ADLs safely. Target date: 03/12/15.   Baseline Met on 02/22/2015 with 0/10 dizziness with sit<>stand.   Status Achieved   PT LONG TERM GOAL #2   Title Pt will improve DGI score to >/=20/24 to decrease falls risk. Target date: 03/12/15.   Status On-going   PT LONG TERM GOAL #3   Title Pt will perform TUG with no AD in </=13.5sec. to decrease falls risk. Target date: 03/12/15.   Status On-going   PT LONG TERM GOAL #4   Title Pt will report 0/10 dizziness while driving in order to drive safely. Target date: 03/12/15.   Status On-going   PT LONG TERM GOAL #5   Title Perform SOT and write goal if appropriate. Target date: 03/12/15.   Baseline SOT assessed on 02/19/2015   Status Achieved   PT LONG TERM GOAL #6   Title The patient will improve sensory organization testing from 46% to > or equal to 60% to demo improving use of sensory systems for balance.   Baseline Target date 03/12/15   Time 4   Period Weeks   Status On-going               Plan - 02/22/15 1651    Clinical Impression Statement The patient reports 85% improvement in dizziness since beginning treatment.  He has 0/10 dizziness at rest today.  Today's session emphasized balance, dynamic gait and postural ROM/awareness for improved mobility.   PT Next Visit Plan Progress balance HEP, emphasize posture and cervical ROM, dynamic gait activities   Recommended Other Services patient reduced frequency to 1x/week   Consulted and Agree with Plan of Care Patient        Problem List Patient Active Problem List   Diagnosis Date Noted  . Essential thrombocytosis 08/26/2012     WEAVER,CHRISTINA, PT 02/22/2015, 11:16 PM  [facility] [address] [place], [place], [address] Phone: [phone]   Fax:  [phone]

## 2015-02-22 NOTE — Patient Instructions (Addendum)
Thoracic Self-Mobilization (Supine)   With rolled towel placed lengthwise at lower ribs level, lie back on towel with arms outstretched. Hold __2 MINUTES. Relax. Repeat __1__ times per set.  Do __2__ sessions per day.  http://orth.exer.us/1000   Copyright  VHI. All rights reserved.  Healthy Back - Shoulder Roll   Stand straight with arms relaxed at sides. Roll shoulders backward continuously. Do _10___ times.    Copyright  VHI. All rights reserved.  Hamstring Stretch, Seated (Strap, Two Chairs)   Sit with one leg extended onto facing chair. NO LOOP NEEDED. Lengthen spine. Hold for __20 SECONDS. Repeat __2-3__ times each leg.  Copyright  VHI. All rights reserved.

## 2015-03-01 ENCOUNTER — Ambulatory Visit: Payer: Medicare Other

## 2015-03-01 DIAGNOSIS — R269 Unspecified abnormalities of gait and mobility: Secondary | ICD-10-CM

## 2015-03-01 DIAGNOSIS — R42 Dizziness and giddiness: Secondary | ICD-10-CM

## 2015-03-01 NOTE — Patient Instructions (Signed)
Feet Apart, Varied Arm Positions - Eyes Closed   Stand with feet shoulder width apart and arms out. Close eyes and visualize upright position. Hold __20__ seconds. Repeat __3__ times per session. Do __2__ sessions per day. Progress by standing on a pillow.  Copyright  VHI. All rights reserved.  Feet Together, Varied Arm Positions - Eyes Closed   Stand with feet together and arms out. Close eyes and visualize upright position. Hold _20___ seconds. Repeat __3__ times per session. Do _2___ sessions per day. When this becomes easy, try standing on a pillow.  Copyright  VHI. All rights reserved.  Feet Apart, Head Motion - Eyes Open   With eyes open, feet apart, move head slowly: side to side x 5 times, rest and repeat. Do _2___ sessions per day. When this becomes easy, progress by standing on a pillow with feet apart, When that becomes easy try closing eyes and turning head.  Copyright  VHI. All rights reserved.  Feet Apart (Compliant Surface) Varied Arm Positions - Eyes Open   With eyes open, standing on compliant surface: ___pillow_____, feet shoulder width apart and arms at side, look at a stationary object. Hold _30___ seconds. Repeat __3__ times per session. Do __2__ sessions per day.  Copyright  VHI. All rights reserved.

## 2015-03-01 NOTE — Therapy (Signed)
[facility] [address] [place], [place], [address] Phone: [phone]   Fax:  [phone]  Physical Therapy Treatment  Patient Details  Name: Ricky Santos MRN: [id number] Date of Birth: 04-30-1934 Referring Provider:  Wenda Low, MD  Encounter Date: 03/01/2015      PT End of Session - 03/01/15 1624    Visit Number 4   Number of Visits 9   Date for PT Re-Evaluation 03/14/15   Authorization Type G-code every 10th visit   PT Start Time 1532   PT Stop Time 1614   PT Time Calculation (min) 42 min   Equipment Utilized During Treatment --  harness in SOT   Activity Tolerance Patient tolerated treatment well   Behavior During Therapy [facility] for tasks assessed/performed      Past Medical History  Diagnosis Date  . Hypothyroid   . Hypertension   . GERD (gastroesophageal reflux disease)   . Prostate cancer   . Back pain   . Hernia   . Hyperlipidemia   . 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   . Essential thrombocytosis 08/26/2012    Past Surgical History  Procedure Laterality Date  . Incision and drainage abscess anal  08/01/05  . Prostate surgery    . Shoulder surgery      rt  . Exp lap for trauma    . Hernia repair    . Colonoscopy  2003  . Back surgery    . Colonoscopy N/A 01/17/2013    Procedure: COLONOSCOPY;  Surgeon: Garlan Fair, MD;  Location: [facility];  Service: Endoscopy;  Laterality: N/A;    There were no vitals filed for this visit.  Visit Diagnosis:  Abnormality of gait  Dizziness and giddiness      Subjective Assessment - 03/01/15 1536    Subjective I'm doing great, I think this can be my last visit.   Pertinent History Hx of prostate CA, essential thrombocytosis   Patient Stated Goals Stop the dizziness and improve balance   Currently in Pain? No/denies         Neuro re-ed: sensory organization test performed with  following results: Conditions: 1:  WNL 2:  WNL 3:  WNL 4: WNL 5: WNL during two trials, but did experience one "fall" during trial two as pt had to touch wall to maintain balance. 6: WNL Composite score:  78 Sensory Analysis Som: WNL Vis: WNL Vest: approx. 48% and normal limits is 50%, so pt slightly below normal limits for age range 55-79. However, this has greatly improved from 0% during first SOT trial. pref: Strategy analysis:      Pt demonstrated good ankle/hip strategy on all trials except for the one marked as a fall: due to decreased hip strategy on condition 5 during the trial that was marked as a fall. COG alignment:      Anterior with lateral lean to R side.                   [facility] Adult PT Treatment/Exercise - 03/01/15 1538    Standardized Balance Assessment   Standardized Balance Assessment Dynamic Gait Index;Timed Up and Go Test   Dynamic Gait Index   Level Surface Normal   Change in Gait Speed Mild Impairment   Gait with Horizontal Head Turns Normal   Gait with Vertical Head Turns Mild Impairment   Gait and Pivot Turn Mild Impairment   Step Over Obstacle Normal  Step Around Obstacles Normal   Steps Normal   Total Score 21   Timed Up and Go Test   TUG Normal TUG   Normal TUG (seconds) 8.77  no AD                PT Education - 03/23/15 1622    Education provided Yes   Education Details Discussed goal progress and outcome measures. Reviewed balance HEP and gave pt instructions to progress as exercises becomes easier. PT also educated pt that dizziness during driving might be related to other factors, and that pt should discuss this with MD.    Terence Lux) Educated Patient   Methods Explanation   Comprehension Verbalized understanding          PT Short Term Goals - 02/12/15 2119    PT SHORT TERM GOAL #1   Title same as LTGs           PT Long Term Goals - 2015/03/23 1625    PT LONG TERM GOAL #1   Title Pt will report baseline  dizziness is 0/10 and does not go >2/10 during sit to stands in order to perform ADLs safely. Target date: 03/12/15.   Baseline Met on 02/22/2015 with 0/10 dizziness with sit<>stand.   Status Achieved   PT LONG TERM GOAL #2   Title Pt will improve DGI score to >/=20/24 to decrease falls risk. Target date: 03/12/15.   Status Achieved   PT LONG TERM GOAL #3   Title Pt will perform TUG with no AD in </=13.5sec. to decrease falls risk. Target date: 03/12/15.   Status Achieved   PT LONG TERM GOAL #4   Title Pt will report 0/10 dizziness while driving in order to drive safely. Target date: 03/12/15.   Status Partially Met   PT LONG TERM GOAL #5   Title Perform SOT and write goal if appropriate. Target date: 03/12/15.   Baseline SOT assessed on 02/19/2015   Status Achieved   PT LONG TERM GOAL #6   Title The patient will improve sensory organization testing from 46% to > or equal to 60% to demo improving use of sensory systems for balance.   Baseline Target date 03/12/15   Time 4   Period Weeks   Status Achieved               Plan - 03-23-2015 1624    Clinical Impression Statement Pt met all LTGs, except for LTG 4, which he partially met. Pt pleased with progress and wishes to d/c from PT. Please see d/c summary for details.          G-Codes - 03-23-15 1626    Functional Assessment Tool Used DGI: 21/24; TUG no AD: 8.77 sec.   Functional Limitation Mobility: Walking and moving around   Mobility: Walking and Moving Around Goal Status [phone]) At least 1 percent but less than 20 percent impaired, limited or restricted   Mobility: Walking and Moving Around Discharge Status [phone]) At least 1 percent but less than 20 percent impaired, limited or restricted      Problem List Patient Active Problem List   Diagnosis Date Noted  . Essential thrombocytosis 08/26/2012    Miller,Jennifer L 03-23-2015, 4:29 PM  [facility] [address] [place], [place], [address] Phone: [phone]   Fax:  [phone]  PHYSICAL THERAPY DISCHARGE SUMMARY  Visits from Start of Care: 4    Current functional level related to goals / functional outcomes:  PT Long Term Goals - 03/01/15 1625    PT LONG TERM GOAL #1   Title Pt will report baseline dizziness is 0/10 and does not go >2/10 during sit to stands in order to perform ADLs safely. Target date: 03/12/15.   Baseline Met on 02/22/2015 with 0/10 dizziness with sit<>stand.   Status Achieved   PT LONG TERM GOAL #2   Title Pt will improve DGI score to >/=20/24 to decrease falls risk. Target date: 03/12/15.   Status Achieved   PT LONG TERM GOAL #3   Title Pt will perform TUG with no AD in </=13.5sec. to decrease falls risk. Target date: 03/12/15.   Status Achieved   PT LONG TERM GOAL #4   Title Pt will report 0/10 dizziness while driving in order to drive safely. Target date: 03/12/15.   Status Partially Met   PT LONG TERM GOAL #5   Title Perform SOT and write goal if appropriate. Target date: 03/12/15.   Baseline SOT assessed on 02/19/2015   Status Achieved   PT LONG TERM GOAL #6   Title The patient will improve sensory organization testing from 46% to > or equal to 60% to demo improving use of sensory systems for balance.   Baseline Target date 03/12/15   Time 4   Period Weeks   Status Achieved        Remaining deficits: Dizziness while driving.   Education / Equipment: HEP  Plan: Patient agrees to discharge.  Patient goals were met. Patient is being discharged due to meeting the stated rehab goals.  ?????       Geoffry Paradise, PT,DPT 03/01/2015 4:29 PM Phone: [phone] Fax: [phone]

## 2015-03-14 ENCOUNTER — Telehealth: Payer: Self-pay | Admitting: Hematology

## 2015-03-14 NOTE — Telephone Encounter (Signed)
pt called to r/s appt...done....pt ok and aware of new d.t °

## 2015-03-15 ENCOUNTER — Encounter: Payer: BC Managed Care – PPO | Admitting: Rehabilitative and Restorative Service Providers"

## 2015-03-19 ENCOUNTER — Ambulatory Visit: Payer: BC Managed Care – PPO | Admitting: Hematology

## 2015-03-19 ENCOUNTER — Other Ambulatory Visit: Payer: BC Managed Care – PPO

## 2015-03-20 ENCOUNTER — Telehealth: Payer: Self-pay | Admitting: Hematology

## 2015-03-20 NOTE — Telephone Encounter (Signed)
PATIENT CALLED TO MOVE 10/20 APPOINTMENTS TO 10/31. PATIENT HAS NEW DATE/TIME.

## 2015-03-21 ENCOUNTER — Ambulatory Visit: Payer: Self-pay | Admitting: Hematology

## 2015-03-21 ENCOUNTER — Other Ambulatory Visit: Payer: Medicare Other

## 2015-04-01 ENCOUNTER — Other Ambulatory Visit (HOSPITAL_BASED_OUTPATIENT_CLINIC_OR_DEPARTMENT_OTHER): Payer: Medicare Other

## 2015-04-01 ENCOUNTER — Encounter: Payer: Self-pay | Admitting: Hematology

## 2015-04-01 ENCOUNTER — Telehealth: Payer: Self-pay | Admitting: Hematology

## 2015-04-01 ENCOUNTER — Ambulatory Visit (HOSPITAL_BASED_OUTPATIENT_CLINIC_OR_DEPARTMENT_OTHER): Payer: Medicare Other | Admitting: Hematology

## 2015-04-01 VITALS — BP 147/77 | HR 71 | Temp 98.1°F | Resp 18 | Ht 69.0 in | Wt 142.8 lb

## 2015-04-01 DIAGNOSIS — R42 Dizziness and giddiness: Secondary | ICD-10-CM | POA: Diagnosis not present

## 2015-04-01 DIAGNOSIS — D473 Essential (hemorrhagic) thrombocythemia: Secondary | ICD-10-CM

## 2015-04-01 LAB — CBC WITH DIFFERENTIAL/PLATELET
BASO%: 1.3 % (ref 0.0–2.0)
Basophils Absolute: 0.1 10*3/uL (ref 0.0–0.1)
EOS ABS: 0.2 10*3/uL (ref 0.0–0.5)
EOS%: 3 % (ref 0.0–7.0)
HCT: 41 % (ref 38.4–49.9)
HEMOGLOBIN: 13.6 g/dL (ref 13.0–17.1)
LYMPH%: 27.8 % (ref 14.0–49.0)
MCH: 32.6 pg (ref 27.2–33.4)
MCHC: 33.1 g/dL (ref 32.0–36.0)
MCV: 98.5 fL — ABNORMAL HIGH (ref 79.3–98.0)
MONO#: 0.3 10*3/uL (ref 0.1–0.9)
MONO%: 6.7 % (ref 0.0–14.0)
NEUT%: 61.2 % (ref 39.0–75.0)
NEUTROS ABS: 3.1 10*3/uL (ref 1.5–6.5)
Platelets: 318 10*3/uL (ref 140–400)
RBC: 4.17 10*6/uL — ABNORMAL LOW (ref 4.20–5.82)
RDW: 13.6 % (ref 11.0–14.6)
WBC: 5 10*3/uL (ref 4.0–10.3)
lymph#: 1.4 10*3/uL (ref 0.9–3.3)

## 2015-04-01 LAB — COMPREHENSIVE METABOLIC PANEL (CC13)
ALBUMIN: 3.8 g/dL (ref 3.5–5.0)
ALK PHOS: 59 U/L (ref 40–150)
ALT: 9 U/L (ref 0–55)
AST: 19 U/L (ref 5–34)
Anion Gap: 7 mEq/L (ref 3–11)
BILIRUBIN TOTAL: 0.38 mg/dL (ref 0.20–1.20)
BUN: 11.3 mg/dL (ref 7.0–26.0)
CO2: 24 mEq/L (ref 22–29)
Calcium: 9.8 mg/dL (ref 8.4–10.4)
Chloride: 110 mEq/L — ABNORMAL HIGH (ref 98–109)
Creatinine: 1 mg/dL (ref 0.7–1.3)
EGFR: 82 mL/min/{1.73_m2} — ABNORMAL LOW (ref >90)
GLUCOSE: 101 mg/dL (ref 70–140)
Potassium: 3.9 mEq/L (ref 3.5–5.1)
SODIUM: 142 meq/L (ref 136–145)
TOTAL PROTEIN: 7 g/dL (ref 6.4–8.3)

## 2015-04-01 NOTE — Telephone Encounter (Signed)
Called patient home number listed in EPIC and was informed I have the wrong number. Called phone number listed under wife's name and left message re 12/2 appointments and asked that patient call to confirm home number. Schedule mailed. pof not sent - found under flow sheets.

## 2015-04-01 NOTE — Progress Notes (Signed)
[facility] [place], [place] [address] [place] [address]  DIAGNOSIS: Essential thrombocytosis ([place])  No chief complaint on file.   CURRENT THERAPY: Hydrea 500 mg daily  INTERVAL HISTORY: Ricky Santos 81 y.o. male with a history of Essential Thrombocytosis with JAK-2 mutation positive is here for follow-up. He complains about persistent dizziness, which is not related to position change, no vertigo, he feels unbalanced and is afraid of falling. He denies any vision or hearing change, or other neuro symptoms. He was seen by his primary care physician Dr. Milinda Hirschfeld who referred him for PT but did not help. He has been complying with Hydrea, which she has imaging on for 2.5 years. He otherwise feels well.   MEDICAL HISTORY: Past Medical History  Diagnosis Date  . Hypothyroid   . Hypertension   . GERD (gastroesophageal reflux disease)   . Prostate cancer ([place])   . Back pain   . Hernia   . Hyperlipidemia   . Essential thrombocytosis ([place]) 07/2012    JAK-2 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   . Essential thrombocytosis ([place]) 08/26/2012    INTERIM HISTORY: has Essential thrombocytosis ([place]) on his problem list.    ALLERGIES:  has No Known Allergies.  MEDICATIONS:    Medication List       This list is accurate as of: 04/01/15  3:00 PM.  Always use your most recent med list.               aspirin 81 MG tablet  Take 81 mg by mouth daily.     atorvastatin 40 MG tablet  Commonly known as:  LIPITOR  Take 40 mg by mouth daily.     diltiazem 240 MG 24 hr capsule  Commonly known as:  CARDIZEM CD  Take 240 mg by mouth daily.     finasteride 5 MG tablet  Commonly known as:  PROSCAR  Take 5 mg by mouth daily.     hydrochlorothiazide 25 MG tablet  Commonly known as:  HYDRODIURIL  Take 25 mg by mouth daily.     hydroxyurea 500 MG capsule  Commonly known as:  HYDREA  Take 1 capsule (500 mg total) by  mouth daily.     levothyroxine 25 MCG tablet  Commonly known as:  SYNTHROID, LEVOTHROID  Take 25 mcg by mouth daily.     lisinopril 20 MG tablet  Commonly known as:  PRINIVIL,ZESTRIL  Take 20 mg by mouth daily.     mirtazapine 15 MG tablet  Commonly known as:  REMERON  Take 15 mg by mouth at bedtime.     oxybutynin 5 MG tablet  Commonly known as:  DITROPAN  Take 5 mg by mouth 2 (two) times daily.     pantoprazole 40 MG tablet  Commonly known as:  PROTONIX  Take 40 mg by mouth daily.     psyllium 58.6 % powder  Commonly known as:  METAMUCIL  Take 1 packet by mouth 3 (three) times daily.     tamsulosin 0.4 MG Caps capsule  Commonly known as:  FLOMAX  Take 0.4 mg by mouth daily.        SURGICAL HISTORY:  Past Surgical History  Procedure Laterality Date  . Incision and drainage abscess anal  08/01/05  . Prostate surgery    . Shoulder surgery      rt  . Exp lap for trauma    . Hernia repair    .  Colonoscopy  2003  . Back surgery    . Colonoscopy N/A 01/17/2013    Procedure: COLONOSCOPY;  Surgeon: Garlan Fair, MD;  Location: [facility];  Service: Endoscopy;  Laterality: N/A;    REVIEW OF SYSTEMS:   Constitutional: Denies fevers, chills or abnormal weight loss Eyes: Denies blurriness of vision Ears, nose, mouth, throat, and face: Denies mucositis or sore throat Respiratory: Denies cough, dyspnea or wheezes Cardiovascular: Denies palpitation, chest discomfort or lower extremity swelling Gastrointestinal:  Denies nausea, heartburn or change in bowel habits Skin: Denies abnormal skin rashes Lymphatics: Denies new lymphadenopathy or easy bruising Neurological:Denies numbness, tingling or new weaknesses Behavioral/Psych: Mood is stable, no new changes  All other systems were reviewed with the patient and are negative.  PHYSICAL EXAMINATION: ECOG PERFORMANCE STATUS:1   Blood pressure 148/71, pulse 78, temperature 98.1 F (36.7 C), temperature source Oral,  resp. rate 18, height 5\' 9"  (1.753 m), weight 142 lb 12.8 oz (64.774 kg), SpO2 100 %. GENERAL:alert, no distress and comfortable SKIN: skin color, texture, turgor are normal, no rashes or significant lesions EYES: normal, Conjunctiva are pink and non-injected, sclera clear OROPHARYNX:no exudate, no erythema and lips, buccal mucosa, and tongue normal  NECK: supple, thyroid normal size, non-tender, without nodularity LYMPH:  no palpable lymphadenopathy in the cervical, axillary or supraclavicular LUNGS: clear to auscultation and percussion with normal breathing effort HEART: regular rate & rhythm and no murmurs and no lower extremity edema ABDOMEN:abdomen soft, non-tender and normal bowel sounds Musculoskeletal:no cyanosis of digits and no clubbing  NEURO: alert & oriented x 3 with fluent speech, no focal motor/sensory deficits   LABORATORY DATA: CBC Latest Ref Rng 04/01/2015 11/20/2014 09/18/2014  WBC 4.0 - 10.3 10e3/uL 5.0 4.5 4.4  Hemoglobin 13.0 - 17.1 g/dL 13.6 11.6(L) 10.8(L)  Hematocrit 38.4 - 49.9 % 41.0 35.0(L) 33.3(L)  Platelets 140 - 400 10e3/uL 318 322 294    CMP Latest Ref Rng 04/01/2015 11/20/2014 09/18/2014  Glucose 70 - 140 mg/dl 101 86 104  BUN 7.0 - 26.0 mg/dL 11.3 20.1 14.4  Creatinine 0.7 - 1.3 mg/dL 1.0 1.3 1.1  Sodium 136 - 145 mEq/L 142 140 137  Potassium 3.5 - 5.1 mEq/L 3.9 4.3 3.6  Chloride 96 - 112 mEq/L - - -  CO2 22 - 29 mEq/L 24 25 19(L)  Calcium 8.4 - 10.4 mg/dL 9.8 9.2 8.6  Total Protein 6.4 - 8.3 g/dL 7.0 6.3(L) 6.3(L)  Total Bilirubin 0.20 - 1.20 mg/dL 0.38 0.60 0.53  Alkaline Phos 40 - 150 U/L 59 49 51  AST 5 - 34 U/L 19 20 21   ALT 0 - 55 U/L 9 10 10       RADIOGRAPHIC STUDIES: No results found.  ASSESSMENT: Ricky Santos 81 y.o. male with a history of Essential thrombocytosis ([place])   PLAN:  1. Essential Thrombocytosis. JAK2(+) -He was diagnosed in March 2014, initial platelet count in the 500k range, JAK2 mutation (+), he did not have bone  Santos biopsy.  -We discussed that this is a myeloproliferative disorder, and there is small percentage pts will develop myelofibrosis and AML lateon.  -We also reviewed the increased risks of thrombosis from ET.   -He has responded to Hydrea very well, his platelet count has been normal. -dur to his persistent and worsening dizziness, which is possible but less likely related to Hydrea, I'll hold on Hydrea for one months to see if his dizziness improves. -cont take aspirin 81 mg daily.  2. Anemia -Developed in the past  4-5 months -possibly related to Hydrea. His MCV was normal 4 months ago, no increased. -His folic acid, P01, iron studies and ferritin were normal.  -Resolved and now, hemoglobin 13.6 today.  3. Persistent dizziness -Etiology is unknown, he is not hypotensive or orthostatic today -If his dizziness does not improve after stopping Hydrea, I think he would benefit from seeing a neurologist, we'll ask his primary care physician Dr. Lysle Rubens to see if he agrees  Plan -Stop Hydrea for now, to see if his dizziness improves -Consider referring to neurology, if his PCP Dr. Lysle Rubens agrees, will contact his office  -I will see him back in one month with lab   All questions were answered. The patient knows to call the clinic with any problems, questions or concerns. We can certainly see the patient much sooner if necessary.  I spent 20 minutes counseling the patient face to face. The total time spent in the appointment was 25 minutes.    Truitt Merle, MD 04/01/2015 3:00 PM.

## 2015-04-02 DIAGNOSIS — R42 Dizziness and giddiness: Secondary | ICD-10-CM | POA: Insufficient documentation

## 2015-05-03 ENCOUNTER — Other Ambulatory Visit (HOSPITAL_BASED_OUTPATIENT_CLINIC_OR_DEPARTMENT_OTHER): Payer: Medicare Other

## 2015-05-03 ENCOUNTER — Encounter: Payer: Self-pay | Admitting: Hematology

## 2015-05-03 ENCOUNTER — Telehealth: Payer: Self-pay | Admitting: Hematology

## 2015-05-03 ENCOUNTER — Ambulatory Visit (HOSPITAL_BASED_OUTPATIENT_CLINIC_OR_DEPARTMENT_OTHER): Payer: Medicare Other | Admitting: Hematology

## 2015-05-03 VITALS — BP 124/63 | HR 80 | Temp 98.7°F | Resp 18 | Ht 69.0 in | Wt 145.0 lb

## 2015-05-03 DIAGNOSIS — D473 Essential (hemorrhagic) thrombocythemia: Secondary | ICD-10-CM | POA: Diagnosis not present

## 2015-05-03 LAB — COMPREHENSIVE METABOLIC PANEL
ALT: <9 U/L (ref 0–55)
AST: 22 U/L (ref 5–34)
Albumin: 3.9 g/dL (ref 3.5–5.0)
Alkaline Phosphatase: 60 U/L (ref 40–150)
Anion Gap: 10 mEq/L (ref 3–11)
BILIRUBIN TOTAL: 0.78 mg/dL (ref 0.20–1.20)
BUN: 12.8 mg/dL (ref 7.0–26.0)
CO2: 24 meq/L (ref 22–29)
Calcium: 9.7 mg/dL (ref 8.4–10.4)
Chloride: 108 mEq/L (ref 98–109)
Creatinine: 1.2 mg/dL (ref 0.7–1.3)
EGFR: 69 mL/min/{1.73_m2} — AB (ref >90)
GLUCOSE: 114 mg/dL (ref 70–140)
Potassium: 4.1 mEq/L (ref 3.5–5.1)
SODIUM: 142 meq/L (ref 136–145)
TOTAL PROTEIN: 7.3 g/dL (ref 6.4–8.3)

## 2015-05-03 LAB — CBC WITH DIFFERENTIAL/PLATELET
BASO%: 0.9 % (ref 0.0–2.0)
Basophils Absolute: 0.1 10*3/uL (ref 0.0–0.1)
EOS%: 3.1 % (ref 0.0–7.0)
Eosinophils Absolute: 0.2 10*3/uL (ref 0.0–0.5)
HCT: 40.3 % (ref 38.4–49.9)
HEMOGLOBIN: 13.6 g/dL (ref 13.0–17.1)
LYMPH#: 1.4 10*3/uL (ref 0.9–3.3)
LYMPH%: 23.7 % (ref 14.0–49.0)
MCH: 31.6 pg (ref 27.2–33.4)
MCHC: 33.7 g/dL (ref 32.0–36.0)
MCV: 93.7 fL (ref 79.3–98.0)
MONO#: 0.2 10*3/uL (ref 0.1–0.9)
MONO%: 3.3 % (ref 0.0–14.0)
NEUT%: 69 % (ref 39.0–75.0)
NEUTROS ABS: 4 10*3/uL (ref 1.5–6.5)
NRBC: 0 % (ref 0–0)
Platelets: 539 10*3/uL — ABNORMAL HIGH (ref 140–400)
RBC: 4.3 10*6/uL (ref 4.20–5.82)
RDW: 13.3 % (ref 11.0–14.6)
WBC: 5.8 10*3/uL (ref 4.0–10.3)

## 2015-05-03 MED ORDER — ANAGRELIDE HCL 0.5 MG PO CAPS: 0.5000 mg | ORAL_CAPSULE | Freq: Two times a day (BID) | ORAL | Status: DC

## 2015-05-03 NOTE — Progress Notes (Signed)
[place], [place] [address] [place] [address]  DIAGNOSIS: Essential thrombocytosis ([place])  No chief complaint on file.  PREVIOUS THERAPY: Hydrea 500 mg daily, from 08/2012 to 04/01/2015, stopped due to recurrent episodes of dizziness.  CURRENT THERAPY: will start anagrelide 0.5 mg twice daily in a few days  INTERVAL HISTORY: Ricky Santos 81 y.o. male with a history of Essential Thrombocytosis with JAK-2 mutation positive is here for follow-up. He stopped Hydrea about 5 weeks ago. His dizziness episodes did resolve until 4 days ago, he had another episodes, which only lasted for a few minutes. No other new complaints.  MEDICAL HISTORY: Past Medical History  Diagnosis Date  . Hypothyroid   . Hypertension   . GERD (gastroesophageal reflux disease)   . Prostate cancer ([place])   . Back pain   . Hernia   . Hyperlipidemia   . Essential thrombocytosis ([place]) 07/2012    JAK-2 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   . Essential thrombocytosis ([place]) 08/26/2012    INTERIM HISTORY: has Essential thrombocytosis (HCC) and Dizziness on his problem list.    ALLERGIES:  has No Known Allergies.  MEDICATIONS:    Medication List       This list is accurate as of: 05/03/15  2:46 PM.  Always use your most recent med list.               aspirin 81 MG tablet  Take 81 mg by mouth daily.     atorvastatin 40 MG tablet  Commonly known as:  LIPITOR  Take 40 mg by mouth daily.     diltiazem 240 MG 24 hr capsule  Commonly known as:  CARDIZEM CD  Take 240 mg by mouth daily.     finasteride 5 MG tablet  Commonly known as:  PROSCAR  Take 5 mg by mouth daily.     hydrochlorothiazide 25 MG tablet  Commonly known as:  HYDRODIURIL  Take 25 mg by mouth daily.     hydroxyurea 500 MG capsule  Commonly known as:  HYDREA  Take 1 capsule (500 mg total) by mouth daily.     levothyroxine 25 MCG tablet  Commonly known as:   SYNTHROID, LEVOTHROID  Take 25 mcg by mouth daily.     lisinopril 20 MG tablet  Commonly known as:  PRINIVIL,ZESTRIL  Take 20 mg by mouth daily.     meclizine 25 MG tablet  Commonly known as:  ANTIVERT  Take 25 mg by mouth as needed.     mirtazapine 15 MG tablet  Commonly known as:  REMERON  Take 15 mg by mouth at bedtime.     oxybutynin 5 MG tablet  Commonly known as:  DITROPAN  Take 5 mg by mouth 2 (two) times daily.     pantoprazole 40 MG tablet  Commonly known as:  PROTONIX  Take 40 mg by mouth daily.     psyllium 58.6 % powder  Commonly known as:  METAMUCIL  Take 1 packet by mouth 3 (three) times daily.     tamsulosin 0.4 MG Caps capsule  Commonly known as:  FLOMAX  Take 0.4 mg by mouth daily.     tiZANidine 4 MG tablet  Commonly known as:  ZANAFLEX  Take 4 mg by mouth daily.        SURGICAL HISTORY:  Past Surgical History  Procedure Laterality Date  . Incision and drainage abscess anal  08/01/05  .  Prostate surgery    . Shoulder surgery      rt  . Exp lap for trauma    . Hernia repair    . Colonoscopy  2003  . Back surgery    . Colonoscopy N/A 01/17/2013    Procedure: COLONOSCOPY;  Surgeon: Garlan Fair, MD;  Location: [facility];  Service: Endoscopy;  Laterality: N/A;    REVIEW OF SYSTEMS:   Constitutional: Denies fevers, chills or abnormal weight loss Eyes: Denies blurriness of vision Ears, nose, mouth, throat, and face: Denies mucositis or sore throat Respiratory: Denies cough, dyspnea or wheezes Cardiovascular: Denies palpitation, chest discomfort or lower extremity swelling Gastrointestinal:  Denies nausea, heartburn or change in bowel habits Skin: Denies abnormal skin rashes Lymphatics: Denies new lymphadenopathy or easy bruising Neurological:Denies numbness, tingling or new weaknesses Behavioral/Psych: Mood is stable, no new changes  All other systems were reviewed with the patient and are negative.  PHYSICAL EXAMINATION: ECOG  PERFORMANCE STATUS:1  Blood pressure 124/63, pulse 80, temperature 98.7 F (37.1 C), temperature source Oral, resp. rate 18, height $RemoveBe'5\' 9"'RORLUXXzs$  (1.753 m), weight 145 lb (65.772 kg), SpO2 100 %. GENERAL:alert, no distress and comfortable SKIN: skin color, texture, turgor are normal, no rashes or significant lesions EYES: normal, Conjunctiva are pink and non-injected, sclera clear OROPHARYNX:no exudate, no erythema and lips, buccal mucosa, and tongue normal  NECK: supple, thyroid normal size, non-tender, without nodularity LYMPH:  no palpable lymphadenopathy in the cervical, axillary or supraclavicular LUNGS: clear to auscultation and percussion with normal breathing effort HEART: regular rate & rhythm and no murmurs and no lower extremity edema ABDOMEN:abdomen soft, non-tender and normal bowel sounds Musculoskeletal:no cyanosis of digits and no clubbing  NEURO: alert & oriented x 3 with fluent speech, no focal motor/sensory deficits   LABORATORY DATA: CBC Latest Ref Rng 05/03/2015 04/01/2015 11/20/2014  WBC 4.0 - 10.3 10e3/uL 5.8 5.0 4.5  Hemoglobin 13.0 - 17.1 g/dL 13.6 13.6 11.6(L)  Hematocrit 38.4 - 49.9 % 40.3 41.0 35.0(L)  Platelets 140 - 400 10e3/uL 539(H) 318 322    CMP Latest Ref Rng 05/03/2015 04/01/2015 11/20/2014  Glucose 70 - 140 mg/dl 114 101 86  BUN 7.0 - 26.0 mg/dL 12.8 11.3 20.1  Creatinine 0.7 - 1.3 mg/dL 1.2 1.0 1.3  Sodium 136 - 145 mEq/L 142 142 140  Potassium 3.5 - 5.1 mEq/L 4.1 3.9 4.3  Chloride 96 - 112 mEq/L - - -  CO2 22 - 29 mEq/L $Remove'24 24 25  'IOVnCBj$ Calcium 8.4 - 10.4 mg/dL 9.7 9.8 9.2  Total Protein 6.4 - 8.3 g/dL 7.3 7.0 6.3(L)  Total Bilirubin 0.20 - 1.20 mg/dL 0.78 0.38 0.60  Alkaline Phos 40 - 150 U/L 60 59 49  AST 5 - 34 U/L $Remo'22 19 20  'RMfPx$ ALT 0 - 55 U/L '9 9 10      '$ RADIOGRAPHIC STUDIES: No results found.  ASSESSMENT: Ricky Santos 81 y.o. male with a history of Essential thrombocytosis ([place])   PLAN:  1. Essential Thrombocytosis. JAK2(+) -He was diagnosed in  March 2014, initial platelet count in the 500k range, JAK2 mutation (+), he did not have bone Santos biopsy.  -We discussed that this is a myeloproliferative disorder, people usually do very well, and there is small percentage pts will develop myelofibrosis and AML late on.  -We also reviewed the increased risks of thrombosis from ET, which is the main complication.  -his dizziness episodes did resolve in most part since he stopped Hydrea 1 months ago. I would not  resume -His platelet count 1 up to 539 today, I recommend him to switch to anagrelide 0.5 mg twice daily. Potential side effects, such as thrormbocytopenia, arrhythmia, CHF, hemorrhage, headaches, dizziness, etc were discussed with patient. He agrees to proceed -cont take aspirin 81 mg daily.  2. Anemia -secondary to Hydrea. His MCV was normal 4 months ago, no increased. -His folic acid, X21, iron studies and ferritin were normal.  -Resolved and now, hemoglobin 13.6 today.  3. Persistent dizziness -likely related to Hydrea, and it has resolved most part since I stopped his Hydrea 1 months ago. -he did have another episode of dizziness 4 days ago. We'll continue monitoring for now. If he has more frequent dizziness episodes in the next few months, I'll refer him to neurology  Plan -start anagrelide 0.5 mg twice daily. I sent a prescription to his pharmacy -I will see him back in one month with lab   All questions were answered. The patient knows to call the clinic with any problems, questions or concerns. We can certainly see the patient much sooner if necessary.  I spent 20 minutes counseling the patient face to face. The total time spent in the appointment was 25 minutes.    Truitt Merle, MD 05/03/2015 2:46 PM.

## 2015-05-03 NOTE — Telephone Encounter (Signed)
Gave patient avs report and appointments for January  °

## 2015-06-07 ENCOUNTER — Ambulatory Visit (HOSPITAL_BASED_OUTPATIENT_CLINIC_OR_DEPARTMENT_OTHER): Payer: Medicare Other | Admitting: Hematology

## 2015-06-07 ENCOUNTER — Telehealth: Payer: Self-pay | Admitting: Hematology

## 2015-06-07 ENCOUNTER — Encounter: Payer: Self-pay | Admitting: Hematology

## 2015-06-07 ENCOUNTER — Other Ambulatory Visit (HOSPITAL_BASED_OUTPATIENT_CLINIC_OR_DEPARTMENT_OTHER): Payer: Medicare Other

## 2015-06-07 VITALS — BP 139/70 | HR 76 | Temp 97.9°F | Resp 16 | Ht 69.0 in | Wt 145.7 lb

## 2015-06-07 DIAGNOSIS — R200 Anesthesia of skin: Secondary | ICD-10-CM

## 2015-06-07 DIAGNOSIS — D473 Essential (hemorrhagic) thrombocythemia: Secondary | ICD-10-CM | POA: Diagnosis not present

## 2015-06-07 DIAGNOSIS — R42 Dizziness and giddiness: Secondary | ICD-10-CM

## 2015-06-07 DIAGNOSIS — H538 Other visual disturbances: Secondary | ICD-10-CM

## 2015-06-07 LAB — CBC WITH DIFFERENTIAL/PLATELET
BASO%: 1.2 % (ref 0.0–2.0)
Basophils Absolute: 0.1 10*3/uL (ref 0.0–0.1)
EOS%: 3 % (ref 0.0–7.0)
Eosinophils Absolute: 0.2 10*3/uL (ref 0.0–0.5)
HEMATOCRIT: 40.2 % (ref 38.4–49.9)
HGB: 13.3 g/dL (ref 13.0–17.1)
LYMPH%: 26.3 % (ref 14.0–49.0)
MCH: 29.8 pg (ref 27.2–33.4)
MCHC: 33.1 g/dL (ref 32.0–36.0)
MCV: 89.9 fL (ref 79.3–98.0)
MONO#: 0.4 10*3/uL (ref 0.1–0.9)
MONO%: 5.2 % (ref 0.0–14.0)
NEUT#: 4.4 10*3/uL (ref 1.5–6.5)
NEUT%: 64.3 % (ref 39.0–75.0)
Platelets: 538 10*3/uL — ABNORMAL HIGH (ref 140–400)
RBC: 4.47 10*6/uL (ref 4.20–5.82)
RDW: 13.5 % (ref 11.0–14.6)
WBC: 6.9 10*3/uL (ref 4.0–10.3)
lymph#: 1.8 10*3/uL (ref 0.9–3.3)
nRBC: 0 % (ref 0–0)

## 2015-06-07 LAB — COMPREHENSIVE METABOLIC PANEL
ALT: 11 U/L (ref 0–55)
AST: 24 U/L (ref 5–34)
Albumin: 4.1 g/dL (ref 3.5–5.0)
Alkaline Phosphatase: 68 U/L (ref 40–150)
Anion Gap: 12 mEq/L — ABNORMAL HIGH (ref 3–11)
BUN: 12.7 mg/dL (ref 7.0–26.0)
CHLORIDE: 103 meq/L (ref 98–109)
CO2: 24 meq/L (ref 22–29)
CREATININE: 1.2 mg/dL (ref 0.7–1.3)
Calcium: 9.5 mg/dL (ref 8.4–10.4)
EGFR: 66 mL/min/{1.73_m2} — ABNORMAL LOW (ref >90)
Glucose: 102 mg/dl (ref 70–140)
POTASSIUM: 3.8 meq/L (ref 3.5–5.1)
SODIUM: 138 meq/L (ref 136–145)
Total Bilirubin: 0.62 mg/dL (ref 0.20–1.20)
Total Protein: 7.9 g/dL (ref 6.4–8.3)

## 2015-06-07 NOTE — Telephone Encounter (Signed)
Gave patient avs report and appointments for February. No pof sent - appointments schedule per office note patient to have office visit/lab in one month.

## 2015-06-07 NOTE — Progress Notes (Signed)
[place], [place] [address] [place] [address]  DIAGNOSIS: Essential thrombocytosis (HCC)  Dizziness  No chief complaint on file.  PREVIOUS THERAPY: Hydrea 500 mg daily, from 08/2012 to 04/01/2015, stopped on 04/02/2015 due to recurrent episodes of dizziness, restarted on 06/08/2015. He tried anagrelide 0.'5mg'$  bid for one month in 05/2015 which was not effective   CURRENT THERAPY: will restart hydrea '500mg'$  daily in a few days   INTERVAL HISTORY: Ricky Santos 82 y.o. male with a history of Essential Thrombocytosis with JAK-2 mutation positive is here for follow-up. He has been on anagrelide for the past month. His dizziness has been persistent and nagging, no vertigo, he also has mild right blurry vision, and intermittent left neck, shoulder and upper left chest numbness, non-exertional.   MEDICAL HISTORY: Past Medical History  Diagnosis Date  . Hypothyroid   . Hypertension   . GERD (gastroesophageal reflux disease)   . Prostate cancer ([place])   . Back pain   . Hernia   . Hyperlipidemia   . Essential thrombocytosis ([place]) 07/2012    JAK-2 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   . Essential thrombocytosis ([place]) 08/26/2012    INTERIM HISTORY: has Essential thrombocytosis (HCC) and Dizziness on his problem list.    ALLERGIES:  has No Known Allergies.  MEDICATIONS:    Medication List       This list is accurate as of: 06/07/15 10:56 PM.  Always use your most recent med list.               anagrelide 0.5 MG capsule  Commonly known as:  AGRYLIN  Take 1 capsule (0.5 mg total) by mouth 2 (two) times daily.     aspirin 81 MG tablet  Take 81 mg by mouth daily.     atorvastatin 40 MG tablet  Commonly known as:  LIPITOR  Take 40 mg by mouth daily.     diltiazem 240 MG 24 hr capsule  Commonly known as:  CARDIZEM CD  Take 240 mg by mouth daily.     finasteride 5 MG tablet  Commonly known as:  PROSCAR   Take 5 mg by mouth daily.     hydrochlorothiazide 25 MG tablet  Commonly known as:  HYDRODIURIL  Take 25 mg by mouth daily.     hydroxyurea 500 MG capsule  Commonly known as:  HYDREA  Take 1 capsule (500 mg total) by mouth daily.     levothyroxine 25 MCG tablet  Commonly known as:  SYNTHROID, LEVOTHROID  Take 25 mcg by mouth daily.     lisinopril 20 MG tablet  Commonly known as:  PRINIVIL,ZESTRIL  Take 20 mg by mouth daily.     meclizine 25 MG tablet  Commonly known as:  ANTIVERT  Take 25 mg by mouth as needed.     mirtazapine 15 MG tablet  Commonly known as:  REMERON  Take 15 mg by mouth at bedtime.     oxybutynin 5 MG tablet  Commonly known as:  DITROPAN  Take 5 mg by mouth 2 (two) times daily.     pantoprazole 40 MG tablet  Commonly known as:  PROTONIX  Take 40 mg by mouth daily.     psyllium 58.6 % powder  Commonly known as:  METAMUCIL  Take 1 packet by mouth 3 (three) times daily.     tamsulosin 0.4 MG Caps capsule  Commonly known as:  FLOMAX  Take 0.4  mg by mouth daily.     tiZANidine 4 MG tablet  Commonly known as:  ZANAFLEX  Take 4 mg by mouth daily.        SURGICAL HISTORY:  Past Surgical History  Procedure Laterality Date  . Incision and drainage abscess anal  08/01/05  . Prostate surgery    . Shoulder surgery      rt  . Exp lap for trauma    . Hernia repair    . Colonoscopy  2003  . Back surgery    . Colonoscopy N/A 01/17/2013    Procedure: COLONOSCOPY;  Surgeon: Garlan Fair, MD;  Location: [facility];  Service: Endoscopy;  Laterality: N/A;    REVIEW OF SYSTEMS:   Constitutional: Denies fevers, chills or abnormal weight loss Eyes: Denies blurriness of vision Ears, nose, mouth, throat, and face: Denies mucositis or sore throat Respiratory: Denies cough, dyspnea or wheezes Cardiovascular: Denies palpitation, chest discomfort or lower extremity swelling Gastrointestinal:  Denies nausea, heartburn or change in bowel habits Skin:  Denies abnormal skin rashes Lymphatics: Denies new lymphadenopathy or easy bruising Neurological:Denies numbness, tingling or new weaknesses Behavioral/Psych: Mood is stable, no new changes  All other systems were reviewed with the patient and are negative.  PHYSICAL EXAMINATION: ECOG PERFORMANCE STATUS:1  Blood pressure 139/70, pulse 76, temperature 97.9 F (36.6 C), temperature source Oral, resp. rate 16, height '5\' 9"'$  (1.753 m), weight 145 lb 11.2 oz (66.089 kg), SpO2 100 %. GENERAL:alert, no distress and comfortable SKIN: skin color, texture, turgor are normal, no rashes or significant lesions EYES: normal, Conjunctiva are pink and non-injected, sclera clear OROPHARYNX:no exudate, no erythema and lips, buccal mucosa, and tongue normal  NECK: supple, thyroid normal size, non-tender, without nodularity LYMPH:  no palpable lymphadenopathy in the cervical, axillary or supraclavicular LUNGS: clear to auscultation and percussion with normal breathing effort HEART: regular rate & rhythm and no murmurs and no lower extremity edema ABDOMEN:abdomen soft, non-tender and normal bowel sounds Musculoskeletal:no cyanosis of digits and no clubbing  NEURO: alert & oriented x 3 with fluent speech, no focal motor/sensory deficits   LABORATORY DATA: CBC Latest Ref Rng 06/07/2015 05/03/2015 04/01/2015  WBC 4.0 - 10.3 10e3/uL 6.9 5.8 5.0  Hemoglobin 13.0 - 17.1 g/dL 13.3 13.6 13.6  Hematocrit 38.4 - 49.9 % 40.2 40.3 41.0  Platelets 140 - 400 10e3/uL 538(H) 539(H) 318    CMP Latest Ref Rng 06/07/2015 05/03/2015 04/01/2015  Glucose 70 - 140 mg/dl 102 114 101  BUN 7.0 - 26.0 mg/dL 12.7 12.8 11.3  Creatinine 0.7 - 1.3 mg/dL 1.2 1.2 1.0  Sodium 136 - 145 mEq/L 138 142 142  Potassium 3.5 - 5.1 mEq/L 3.8 4.1 3.9  Chloride 96 - 112 mEq/L - - -  CO2 22 - 29 mEq/L '24 24 24  '$ Calcium 8.4 - 10.4 mg/dL 9.5 9.7 9.8  Total Protein 6.4 - 8.3 g/dL 7.9 7.3 7.0  Total Bilirubin 0.20 - 1.20 mg/dL 0.62 0.78 0.38   Alkaline Phos 40 - 150 U/L 68 60 59  AST 5 - 34 U/L '24 22 19  '$ ALT 0 - 55 U/L 11 <9 9      RADIOGRAPHIC STUDIES: No results found.  ASSESSMENT: Ricky Santos 82 y.o. male with a history of Essential thrombocytosis ([place])  Dizziness   PLAN:  1. Essential Thrombocytosis. JAK2(+) -He was diagnosed in March 2014, initial platelet count in the 500k range, JAK2 mutation (+), he did not have bone Santos biopsy.  -We discussed that this  is a myeloproliferative disorder, people usually do very well, and there is small percentage pts will develop myelofibrosis and AML late on.  -We also reviewed the increased risks of thrombosis from ET, which is the main complication.  -anagrelide did not control his plt well, could be related to its low dose -since his dizziness is unlikely related to hydrea, I recommend him to stop anagrelide and resume hydrea   2. Persistent dizziness -unlikely related to hydrea -He previously participated a course of inner ear balance PT without any success  -given his other neuro symptoms (right blurry vision, neck, left shoulder numbness, etc), I recommend him to have a brain and cervical spine MRI, and neurology referral. But I will defer these to his PCP Dr. Lysle Rubens. I encourage him to follow up with Dr. Lysle Rubens soon.   Plan -stop anagrelide and resume hydrea -repeat CBC in one month and I will see him back in 2 months with lab -will copy his PCP Dr. Lysle Rubens   All questions were answered. The patient knows to call the clinic with any problems, questions or concerns. We can certainly see the patient much sooner if necessary.  I spent 20 minutes counseling the patient face to face. The total time spent in the appointment was 25 minutes.    Truitt Merle, MD 06/07/2015

## 2015-06-25 ENCOUNTER — Other Ambulatory Visit: Payer: Self-pay | Admitting: Internal Medicine

## 2015-06-25 ENCOUNTER — Ambulatory Visit
Admission: RE | Admit: 2015-06-25 | Discharge: 2015-06-25 | Disposition: A | Discharge status: Home / Self Care | Payer: Medicare Other | Source: Ambulatory Visit | Attending: Internal Medicine | Admitting: Internal Medicine

## 2015-06-25 DIAGNOSIS — M5489 Other dorsalgia: Secondary | ICD-10-CM

## 2015-07-05 ENCOUNTER — Ambulatory Visit (HOSPITAL_BASED_OUTPATIENT_CLINIC_OR_DEPARTMENT_OTHER): Payer: Medicare Other | Admitting: Hematology

## 2015-07-05 ENCOUNTER — Other Ambulatory Visit (HOSPITAL_BASED_OUTPATIENT_CLINIC_OR_DEPARTMENT_OTHER): Payer: Medicare Other

## 2015-07-05 ENCOUNTER — Telehealth: Payer: Self-pay | Admitting: Hematology

## 2015-07-05 ENCOUNTER — Encounter: Payer: Self-pay | Admitting: Hematology

## 2015-07-05 VITALS — BP 154/78 | HR 73 | Temp 98.0°F | Resp 18 | Ht 69.0 in | Wt 147.9 lb

## 2015-07-05 DIAGNOSIS — R42 Dizziness and giddiness: Secondary | ICD-10-CM

## 2015-07-05 DIAGNOSIS — D473 Essential (hemorrhagic) thrombocythemia: Secondary | ICD-10-CM

## 2015-07-05 LAB — CBC WITH DIFFERENTIAL/PLATELET
BASO%: 2.6 % — AB (ref 0.0–2.0)
Basophils Absolute: 0.1 10*3/uL (ref 0.0–0.1)
EOS ABS: 0.2 10*3/uL (ref 0.0–0.5)
EOS%: 4.1 % (ref 0.0–7.0)
HCT: 40.3 % (ref 38.4–49.9)
HGB: 13.2 g/dL (ref 13.0–17.1)
LYMPH%: 28.2 % (ref 14.0–49.0)
MCH: 29.3 pg (ref 27.2–33.4)
MCHC: 32.8 g/dL (ref 32.0–36.0)
MCV: 89.6 fL (ref 79.3–98.0)
MONO#: 0.4 10*3/uL (ref 0.1–0.9)
MONO%: 7.7 % (ref 0.0–14.0)
NEUT%: 57.4 % (ref 39.0–75.0)
NEUTROS ABS: 2.9 10*3/uL (ref 1.5–6.5)
PLATELETS: 284 10*3/uL (ref 140–400)
RBC: 4.5 10*6/uL (ref 4.20–5.82)
RDW: 15 % — ABNORMAL HIGH (ref 11.0–14.6)
WBC: 5.1 10*3/uL (ref 4.0–10.3)
lymph#: 1.4 10*3/uL (ref 0.9–3.3)
nRBC: 0 % (ref 0–0)

## 2015-07-05 LAB — COMPREHENSIVE METABOLIC PANEL
ALBUMIN: 3.9 g/dL (ref 3.5–5.0)
ALK PHOS: 65 U/L (ref 40–150)
ALT: 11 U/L (ref 0–55)
ANION GAP: 11 meq/L (ref 3–11)
AST: 23 U/L (ref 5–34)
BILIRUBIN TOTAL: 0.45 mg/dL (ref 0.20–1.20)
BUN: 14.2 mg/dL (ref 7.0–26.0)
CALCIUM: 9.7 mg/dL (ref 8.4–10.4)
CO2: 25 mEq/L (ref 22–29)
CREATININE: 1.2 mg/dL (ref 0.7–1.3)
Chloride: 106 mEq/L (ref 98–109)
EGFR: 69 mL/min/{1.73_m2} — AB (ref >90)
Glucose: 80 mg/dl (ref 70–140)
Potassium: 4.1 mEq/L (ref 3.5–5.1)
Sodium: 142 mEq/L (ref 136–145)
TOTAL PROTEIN: 7.6 g/dL (ref 6.4–8.3)

## 2015-07-05 NOTE — Progress Notes (Signed)
[place], [place] [address] [place] [address]  DIAGNOSIS: Essential thrombocytosis [facility])  Dizziness  Chief Complaint  Patient presents with  . Follow-up    essential thrombocytosis   PREVIOUS THERAPY: Hydrea 500 mg daily, from 08/2012 to 04/01/2015, stopped on 04/02/2015 due to recurrent episodes of dizziness, restarted on 06/08/2015. He tried anagrelide 0.'5mg'$  bid for one month in 05/2015 which was not effective   CURRENT THERAPY:  hydrea '500mg'$  daily in a few days   INTERVAL HISTORY: Ricky Santos 82 y.o. male with a history of Essential Thrombocytosis with JAK-2 mutation positive is here for follow-up. He has been  Back to Hydrea 500  Milligrams daily since 4 weeks ago,  He tolerates very well. His dizziness has improved some in the past months, it is tolerable now. He denies any other new neurological symptoms.  MEDICAL HISTORY: Past Medical History  Diagnosis Date  . Hypothyroid   . Hypertension   . GERD (gastroesophageal reflux disease)   . Prostate cancer ([place])   . Back pain   . Hernia   . Hyperlipidemia   . Essential thrombocytosis ([place]) 07/2012    JAK-2 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   . Essential thrombocytosis ([place]) 08/26/2012    INTERIM HISTORY: has Essential thrombocytosis (HCC) and Dizziness on his problem list.    ALLERGIES:  has No Known Allergies.  MEDICATIONS:    Medication List       This list is accurate as of: 07/05/15  2:31 PM.  Always use your most recent med list.               aspirin 81 MG tablet  Take 81 mg by mouth daily.     atorvastatin 40 MG tablet  Commonly known as:  LIPITOR  Take 40 mg by mouth daily.     diltiazem 240 MG 24 hr capsule  Commonly known as:  CARDIZEM CD  Take 240 mg by mouth daily.     finasteride 5 MG tablet  Commonly known as:  PROSCAR  Take 5 mg by mouth daily.     hydrochlorothiazide 25 MG tablet  Commonly known as:   HYDRODIURIL  Take 25 mg by mouth daily.     hydroxyurea 500 MG capsule  Commonly known as:  HYDREA  Take 1 capsule (500 mg total) by mouth daily.     levothyroxine 25 MCG tablet  Commonly known as:  SYNTHROID, LEVOTHROID  Take 25 mcg by mouth daily.     lisinopril 20 MG tablet  Commonly known as:  PRINIVIL,ZESTRIL  Take 20 mg by mouth daily.     meclizine 25 MG tablet  Commonly known as:  ANTIVERT  Take 25 mg by mouth as needed.     mirtazapine 15 MG tablet  Commonly known as:  REMERON  Take 15 mg by mouth at bedtime.     oxybutynin 5 MG tablet  Commonly known as:  DITROPAN  Take 5 mg by mouth 2 (two) times daily.     pantoprazole 40 MG tablet  Commonly known as:  PROTONIX  Take 40 mg by mouth daily.     psyllium 58.6 % powder  Commonly known as:  METAMUCIL  Take 1 packet by mouth 3 (three) times daily.     tamsulosin 0.4 MG Caps capsule  Commonly known as:  FLOMAX  Take 0.4 mg by mouth daily.     tiZANidine 4 MG tablet  Commonly  known as:  ZANAFLEX  Take 4 mg by mouth daily.        SURGICAL HISTORY:  Past Surgical History  Procedure Laterality Date  . Incision and drainage abscess anal  08/01/05  . Prostate surgery    . Shoulder surgery      rt  . Exp lap for trauma    . Hernia repair    . Colonoscopy  2003  . Back surgery    . Colonoscopy N/A 01/17/2013    Procedure: COLONOSCOPY;  Surgeon: Garlan Fair, MD;  Location: [facility];  Service: Endoscopy;  Laterality: N/A;    REVIEW OF SYSTEMS:   Constitutional: Denies fevers, chills or abnormal weight loss Eyes: Denies blurriness of vision Ears, nose, mouth, throat, and face: Denies mucositis or sore throat Respiratory: Denies cough, dyspnea or wheezes Cardiovascular: Denies palpitation, chest discomfort or lower extremity swelling Gastrointestinal:  Denies nausea, heartburn or change in bowel habits Skin: Denies abnormal skin rashes Lymphatics: Denies new lymphadenopathy or easy  bruising Neurological:Denies numbness, tingling or new weaknesses Behavioral/Psych: Mood is stable, no new changes  All other systems were reviewed with the patient and are negative.  PHYSICAL EXAMINATION: ECOG PERFORMANCE STATUS:1  Blood pressure 154/78, pulse 73, temperature 98 F (36.7 C), temperature source Oral, resp. rate 18, height '5\' 9"'$  (1.753 m), weight 147 lb 14.4 oz (67.087 kg), SpO2 100 %. GENERAL:alert, no distress and comfortable SKIN: skin color, texture, turgor are normal, no rashes or significant lesions EYES: normal, Conjunctiva are pink and non-injected, sclera clear OROPHARYNX:no exudate, no erythema and lips, buccal mucosa, and tongue normal  NECK: supple, thyroid normal size, non-tender, without nodularity LYMPH:  no palpable lymphadenopathy in the cervical, axillary or supraclavicular LUNGS: clear to auscultation and percussion with normal breathing effort HEART: regular rate & rhythm and no murmurs and no lower extremity edema ABDOMEN:abdomen soft, non-tender and normal bowel sounds Musculoskeletal:no cyanosis of digits and no clubbing  NEURO: alert & oriented x 3 with fluent speech, no focal motor/sensory deficits   LABORATORY DATA: CBC Latest Ref Rng 07/05/2015 06/07/2015 05/03/2015  WBC 4.0 - 10.3 10e3/uL 5.1 6.9 5.8  Hemoglobin 13.0 - 17.1 g/dL 13.2 13.3 13.6  Hematocrit 38.4 - 49.9 % 40.3 40.2 40.3  Platelets 140 - 400 10e3/uL 284 538(H) 539(H)    CMP Latest Ref Rng 07/05/2015 06/07/2015 05/03/2015  Glucose 70 - 140 mg/dl 80 102 114  BUN 7.0 - 26.0 mg/dL 14.2 12.7 12.8  Creatinine 0.7 - 1.3 mg/dL 1.2 1.2 1.2  Sodium 136 - 145 mEq/L 142 138 142  Potassium 3.5 - 5.1 mEq/L 4.1 3.8 4.1  Chloride 96 - 112 mEq/L - - -  CO2 22 - 29 mEq/L '25 24 24  '$ Calcium 8.4 - 10.4 mg/dL 9.7 9.5 9.7  Total Protein 6.4 - 8.3 g/dL 7.6 7.9 7.3  Total Bilirubin 0.20 - 1.20 mg/dL 0.45 0.62 0.78  Alkaline Phos 40 - 150 U/L 65 68 60  AST 5 - 34 U/L '23 24 22  '$ ALT 0 - 55 U/L 11 11 <9       RADIOGRAPHIC STUDIES: No results found.  ASSESSMENT: Ricky Santos 82 y.o. male with a history of Essential thrombocytosis ([place])  Dizziness   PLAN:  1. Essential Thrombocytosis. JAK2(+) -He was diagnosed in March 2014, initial platelet count in the 500k range, JAK2 mutation (+), he did not have bone Santos biopsy.  -We discussed that this is a myeloproliferative disorder, people usually do very well, and there is small percentage  pts will develop myelofibrosis and AML late on.  -We also reviewed the increased risks of thrombosis from ET, which is the main complication.  -anagrelide did not control his plt well, could be related to its low dose -since his dizziness is unlikely related to hydrea, he has switched back to hydrea,  And his platelet count came down to normal today. We'll continue Hydrea 500 mg daily.  2. Persistent dizziness -unlikely related to hydrea - improved in the past months -He previously participated a course of inner ear balance PT without any success  - he will follow-up with Dr. Milinda Hirschfeld  Plan - continue Hydrea 500 mg daily -repeat CBC in  2 months and I will see him back in 4 months with lab   All questions were answered. The patient knows to call the clinic with any problems, questions or concerns. We can certainly see the patient much sooner if necessary.  I spent 20 minutes counseling the patient face to face. The total time spent in the appointment was 25 minutes.    Truitt Merle, MD 07/05/2015

## 2015-07-05 NOTE — Telephone Encounter (Signed)
Gave patient avs report and appointments for March and May.

## 2015-08-06 ENCOUNTER — Other Ambulatory Visit: Payer: Self-pay | Admitting: Hematology

## 2015-08-06 DIAGNOSIS — D473 Essential (hemorrhagic) thrombocythemia: Secondary | ICD-10-CM

## 2015-08-30 ENCOUNTER — Other Ambulatory Visit (HOSPITAL_BASED_OUTPATIENT_CLINIC_OR_DEPARTMENT_OTHER): Payer: Medicare Other

## 2015-08-30 DIAGNOSIS — D473 Essential (hemorrhagic) thrombocythemia: Secondary | ICD-10-CM | POA: Diagnosis not present

## 2015-08-30 LAB — CBC WITH DIFFERENTIAL/PLATELET
BASO%: 1 % (ref 0.0–2.0)
Basophils Absolute: 0.1 10*3/uL (ref 0.0–0.1)
EOS%: 3.7 % (ref 0.0–7.0)
Eosinophils Absolute: 0.2 10*3/uL (ref 0.0–0.5)
HEMATOCRIT: 36.6 % — AB (ref 38.4–49.9)
HGB: 12.1 g/dL — ABNORMAL LOW (ref 13.0–17.1)
LYMPH#: 1.6 10*3/uL (ref 0.9–3.3)
LYMPH%: 30.6 % (ref 14.0–49.0)
MCH: 29.7 pg (ref 27.2–33.4)
MCHC: 33.1 g/dL (ref 32.0–36.0)
MCV: 89.9 fL (ref 79.3–98.0)
MONO#: 0.4 10*3/uL (ref 0.1–0.9)
MONO%: 6.9 % (ref 0.0–14.0)
NEUT#: 2.9 10*3/uL (ref 1.5–6.5)
NEUT%: 57.8 % (ref 39.0–75.0)
Platelets: 363 10*3/uL (ref 140–400)
RBC: 4.07 10*6/uL — ABNORMAL LOW (ref 4.20–5.82)
RDW: 15.9 % — AB (ref 11.0–14.6)
WBC: 5.1 10*3/uL (ref 4.0–10.3)

## 2015-08-30 LAB — COMPREHENSIVE METABOLIC PANEL
ALT: <9 U/L (ref 0–55)
AST: 19 U/L (ref 5–34)
Albumin: 3.7 g/dL (ref 3.5–5.0)
Alkaline Phosphatase: 55 U/L (ref 40–150)
Anion Gap: 9 mEq/L (ref 3–11)
BUN: 14.1 mg/dL (ref 7.0–26.0)
CALCIUM: 9.5 mg/dL (ref 8.4–10.4)
CHLORIDE: 108 meq/L (ref 98–109)
CO2: 22 meq/L (ref 22–29)
CREATININE: 1 mg/dL (ref 0.7–1.3)
EGFR: 79 mL/min/{1.73_m2} — ABNORMAL LOW (ref >90)
GLUCOSE: 88 mg/dL (ref 70–140)
POTASSIUM: 3.7 meq/L (ref 3.5–5.1)
SODIUM: 139 meq/L (ref 136–145)
Total Bilirubin: 0.36 mg/dL (ref 0.20–1.20)
Total Protein: 7.1 g/dL (ref 6.4–8.3)

## 2015-09-27 ENCOUNTER — Telehealth: Payer: Self-pay | Admitting: Hematology

## 2015-09-27 NOTE — Telephone Encounter (Signed)
cld pt and left a message of time & date of r/s appt on 5/26 @9 :30-adv appt had been moved from 3:15.

## 2015-10-24 ENCOUNTER — Telehealth: Payer: Self-pay | Admitting: Hematology

## 2015-10-24 NOTE — Telephone Encounter (Signed)
Returned call and s.w. Pt and r/s appt...the patient ok and aware of new d.t

## 2015-10-25 ENCOUNTER — Ambulatory Visit: Payer: Medicare Other | Admitting: Hematology

## 2015-10-25 ENCOUNTER — Other Ambulatory Visit: Payer: Medicare Other

## 2015-11-11 ENCOUNTER — Ambulatory Visit (HOSPITAL_BASED_OUTPATIENT_CLINIC_OR_DEPARTMENT_OTHER): Payer: Medicare Other | Admitting: Hematology

## 2015-11-11 ENCOUNTER — Other Ambulatory Visit (HOSPITAL_BASED_OUTPATIENT_CLINIC_OR_DEPARTMENT_OTHER): Payer: Medicare Other

## 2015-11-11 ENCOUNTER — Telehealth: Payer: Self-pay | Admitting: Hematology

## 2015-11-11 ENCOUNTER — Encounter: Payer: Self-pay | Admitting: Hematology

## 2015-11-11 VITALS — BP 145/70 | HR 73 | Temp 98.4°F | Resp 18 | Ht 69.0 in | Wt 145.1 lb

## 2015-11-11 DIAGNOSIS — D473 Essential (hemorrhagic) thrombocythemia: Secondary | ICD-10-CM | POA: Diagnosis not present

## 2015-11-11 LAB — CBC WITH DIFFERENTIAL/PLATELET
BASO%: 1.4 % (ref 0.0–2.0)
BASOS ABS: 0.1 10*3/uL (ref 0.0–0.1)
EOS ABS: 0.2 10*3/uL (ref 0.0–0.5)
EOS%: 4.2 % (ref 0.0–7.0)
HCT: 38.7 % (ref 38.4–49.9)
HEMOGLOBIN: 12.5 g/dL — AB (ref 13.0–17.1)
LYMPH%: 25.3 % (ref 14.0–49.0)
MCH: 30.2 pg (ref 27.2–33.4)
MCHC: 32.3 g/dL (ref 32.0–36.0)
MCV: 93.6 fL (ref 79.3–98.0)
MONO#: 0.3 10*3/uL (ref 0.1–0.9)
MONO%: 6 % (ref 0.0–14.0)
NEUT#: 3.1 10*3/uL (ref 1.5–6.5)
NEUT%: 63.1 % (ref 39.0–75.0)
PLATELETS: 343 10*3/uL (ref 140–400)
RBC: 4.14 10*6/uL — ABNORMAL LOW (ref 4.20–5.82)
RDW: 17 % — AB (ref 11.0–14.6)
WBC: 4.9 10*3/uL (ref 4.0–10.3)
lymph#: 1.2 10*3/uL (ref 0.9–3.3)

## 2015-11-11 LAB — COMPREHENSIVE METABOLIC PANEL
ALBUMIN: 3.6 g/dL (ref 3.5–5.0)
ALK PHOS: 49 U/L (ref 40–150)
ALT: <9 U/L (ref 0–55)
ANION GAP: 9 meq/L (ref 3–11)
AST: 16 U/L (ref 5–34)
BUN: 10.4 mg/dL (ref 7.0–26.0)
CALCIUM: 9.5 mg/dL (ref 8.4–10.4)
CO2: 24 mEq/L (ref 22–29)
Chloride: 110 mEq/L — ABNORMAL HIGH (ref 98–109)
Creatinine: 1 mg/dL (ref 0.7–1.3)
EGFR: 81 mL/min/{1.73_m2} — AB (ref >90)
GLUCOSE: 124 mg/dL (ref 70–140)
POTASSIUM: 3.4 meq/L — AB (ref 3.5–5.1)
SODIUM: 143 meq/L (ref 136–145)
Total Bilirubin: 0.55 mg/dL (ref 0.20–1.20)
Total Protein: 7 g/dL (ref 6.4–8.3)

## 2015-11-11 NOTE — Telephone Encounter (Signed)
Gave pt apt & avs °

## 2015-11-11 NOTE — Progress Notes (Signed)
[place], [address] [place] [address]  DIAGNOSIS: Essential thrombocytosis (Ricky Santos)   PREVIOUS THERAPY: Hydrea 500 mg daily, from 08/2012 to 04/01/2015, stopped on 04/02/2015 due to recurrent episodes of dizziness, restarted on 06/08/2015. He tried anagrelide 0.54m bid for one month in 05/2015 which was not effective   CURRENT THERAPY:  hydrea 5025mdaily   INTERVAL HISTORY: Ricky HERMIZ62.o. male with a history of Essential Thrombocytosis with JAK-2 mutation positive is here for follow-up. He is doing well overall, has not had any dizziness lately. He is compliant with Hydrea and tolerating well, no other new complaints.  MEDICAL HISTORY: Past Medical History  Diagnosis Date  . Hypothyroid   . Hypertension   . GERD (gastroesophageal reflux disease)   . Prostate cancer (HCA[facility]  . Back pain   . Hernia   . Hyperlipidemia   . Essential thrombocytosis ([name]/2014    JAK-2 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   . Essential thrombocytosis ([name]/28/2014    INTERIM HISTORY: has Essential thrombocytosis (HCC) and Dizziness on his problem list.    ALLERGIES:  has No Known Allergies.  MEDICATIONS:    Medication List       This list is accurate as of: 11/11/15  3:42 PM.  Always use your most recent med list.               aspirin 81 MG tablet  Take 81 mg by mouth daily.     atorvastatin 40 MG tablet  Commonly known as:  LIPITOR  Take 40 mg by mouth daily.     diltiazem 240 MG 24 hr capsule  Commonly known as:  CARDIZEM CD  Take 240 mg by mouth daily.     finasteride 5 MG tablet  Commonly known as:  PROSCAR  Take 5 mg by mouth daily.     hydrochlorothiazide 25 MG tablet  Commonly known as:  HYDRODIURIL  Take 25 mg by mouth daily.     hydroxyurea 500 MG capsule  Commonly known as:  HYDREA  Take 1 capsule (500 mg total) by mouth daily.     levothyroxine 25 MCG tablet  Commonly  known as:  SYNTHROID, LEVOTHROID  Take 25 mcg by mouth daily.     lisinopril 20 MG tablet  Commonly known as:  PRINIVIL,ZESTRIL  Take 20 mg by mouth daily.     meclizine 25 MG tablet  Commonly known as:  ANTIVERT  Take 25 mg by mouth as needed. Reported on 11/11/2015     mirtazapine 15 MG tablet  Commonly known as:  REMERON  Take 15 mg by mouth at bedtime.     oxybutynin 5 MG tablet  Commonly known as:  DITROPAN  Take 5 mg by mouth 2 (two) times daily.     pantoprazole 40 MG tablet  Commonly known as:  PROTONIX  Take 40 mg by mouth daily.     psyllium 58.6 % powder  Commonly known as:  METAMUCIL  Take 1 packet by mouth 3 (three) times daily. Reported on 11/11/2015     tamsulosin 0.4 MG Caps capsule  Commonly known as:  FLOMAX  Take 0.4 mg by mouth daily.     tiZANidine 4 MG tablet  Commonly known as:  ZANAFLEX  Take 4 mg by mouth daily. Reported on 11/11/2015        SURGICAL HISTORY:  Past Surgical History  Procedure Laterality Date  .  Incision and drainage abscess anal  08/01/05  . Prostate surgery    . Shoulder surgery      rt  . Exp lap for trauma    . Hernia repair    . Colonoscopy  2003  . Back surgery    . Colonoscopy N/A 01/17/2013    Procedure: COLONOSCOPY;  Surgeon: Garlan Fair, MD;  Location: [facility];  Service: Endoscopy;  Laterality: N/A;    REVIEW OF SYSTEMS:   Constitutional: Denies fevers, chills or abnormal weight loss Eyes: Denies blurriness of vision Ears, nose, mouth, throat, and face: Denies mucositis or sore throat Respiratory: Denies cough, dyspnea or wheezes Cardiovascular: Denies palpitation, chest discomfort or lower extremity swelling Gastrointestinal:  Denies nausea, heartburn or change in bowel habits Skin: Denies abnormal skin rashes Lymphatics: Denies new lymphadenopathy or easy bruising Neurological:Denies numbness, tingling or new weaknesses Behavioral/Psych: Mood is stable, no new changes  All other systems were  reviewed with the patient and are negative.  PHYSICAL EXAMINATION: ECOG PERFORMANCE STATUS:1  Blood pressure 145/70, pulse 73, temperature 98.4 F (36.9 C), temperature source Oral, resp. rate 18, height _0  (1.753 m), weight 145 lb 1.6 oz (65.817 kg), SpO2 100 %. GENERAL:alert, no distress and comfortable SKIN: skin color, texture, turgor are normal, no rashes or significant lesions EYES: normal, Conjunctiva are pink and non-injected, sclera clear OROPHARYNX:no exudate, no erythema and lips, buccal mucosa, and tongue normal  NECK: supple, thyroid normal size, non-tender, without nodularity LYMPH:  no palpable lymphadenopathy in the cervical, axillary or supraclavicular LUNGS: clear to auscultation and percussion with normal breathing effort HEART: regular rate & rhythm and no murmurs and no lower extremity edema ABDOMEN:abdomen soft, non-tender and normal bowel sounds Musculoskeletal:no cyanosis of digits and no clubbing  NEURO: alert & oriented x 3 with fluent speech, no focal motor/sensory deficits   LABORATORY DATA: CBC Latest Ref Rng 11/11/2015 08/30/2015 07/05/2015  WBC 4.0 - 10.3 10e3/uL 4.9 5.1 5.1  Hemoglobin 13.0 - 17.1 g/dL 12.5(L) 12.1(L) 13.2  Hematocrit 38.4 - 49.9 % 38.7 36.6(L) 40.3  Platelets 140 - 400 10e3/uL 343 363 284    CMP Latest Ref Rng 11/11/2015 08/30/2015 07/05/2015  Glucose 70 - 140 mg/dl 124 88 80  BUN 7.0 - 26.0 mg/dL 10.4 14.1 14.2  Creatinine 0.7 - 1.3 mg/dL 1.0 1.0 1.2  Sodium 136 - 145 mEq/L 143 139 142  Potassium 3.5 - 5.1 mEq/L 3.4(L) 3.7 4.1  CO2 22 - 29 mEq/L _1 Calcium 8.4 - 10.4 mg/dL 9.5 9.5 9.7  Total Protein 6.4 - 8.3 g/dL 7.0 7.1 7.6  Total Bilirubin 0.20 - 1.20 mg/dL 0.55 0.36 0.45  Alkaline Phos 40 - 150 U/L 49 55 65  AST 5 - 34 U/L _2 ALT 0 - 55 U/L <9 <9 11      RADIOGRAPHIC STUDIES: No results found.  ASSESSMENT: Ricky Santos 82 y.o. male with a history of Essential thrombocytosis ([name])   PLAN:  1. Essential  Thrombocytosis. JAK2(+) -He was diagnosed in March 2014, initial platelet count in the 500k range, JAK2 mutation (+), he did not have bone Santos biopsy.  -We discussed that this is a myeloproliferative disorder, people usually do very well, and there is small percentage pts will develop myelofibrosis and AML late on.  -We also reviewed the increased risks of thrombosis from ET, which is the main complication.  -anagrelide did not control his plt well, could be related to its low dose -since his  dizziness is unlikely related to hydrea, he has switched back to hydrea,  And his platelet count came down to normal again. We'll continue Hydrea 500 mg daily. He is tolerating well.  2. Persistent dizziness -unlikely related to hydrea -Resolved now -He previously participated a course of inner ear balance PT without any success  - he will follow-up with Dr. Milinda Hirschfeld  Plan - continue Hydrea 500 mg daily -repeat CBC in  2 months and I will see him back in 4 months with lab   All questions were answered. The patient knows to call the clinic with any problems, questions or concerns. We can certainly see the patient much sooner if necessary.  I spent 20 minutes counseling the patient face to face. The total time spent in the appointment was 25 minutes.    Truitt Merle, MD 11/11/2015

## 2016-01-06 ENCOUNTER — Other Ambulatory Visit: Payer: Medicare Other

## 2016-03-02 ENCOUNTER — Ambulatory Visit: Payer: Medicare Other | Admitting: Hematology

## 2016-03-02 ENCOUNTER — Other Ambulatory Visit: Payer: Medicare Other

## 2016-03-02 NOTE — Progress Notes (Deleted)
[place], [place] [address] [place] [address]  DIAGNOSIS: No diagnosis found.   PREVIOUS THERAPY: Hydrea 500 mg daily, from 08/2012 to 04/01/2015, stopped on 04/02/2015 due to recurrent episodes of dizziness, restarted on 06/08/2015. He tried anagrelide 0.37m bid for one month in 05/2015 which was not effective   CURRENT THERAPY:  hydrea 5061mdaily   INTERVAL HISTORY: Ricky GREENFELD89.o. male with a history of Essential Thrombocytosis with JAK-2 mutation positive is here for follow-up. He is doing well overall, has not had any dizziness lately. He is compliant with Hydrea and tolerating well, no other new complaints.  MEDICAL HISTORY: Past Medical History:  Diagnosis Date  . Back pain   . Essential thrombocytosis (HCBellamy3/2014   JAK-2 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   . Essential thrombocytosis (HCPaulding3/28/2014  . GERD (gastroesophageal reflux disease)   . Hernia   . Hyperlipidemia   . Hypertension   . Hypothyroid   . [facility]    INTERIM HISTORY: has Essential thrombocytosis (H[facility]) and Dizziness on his problem list.    ALLERGIES:  has No Known Allergies.  MEDICATIONS:    Medication List       Accurate as of 03/02/16  6:54 AM. Always use your most recent med list.          aspirin 81 MG tablet Take 81 mg by mouth daily.   atorvastatin 40 MG tablet Commonly known as:  LIPITOR Take 40 mg by mouth daily.   diltiazem 240 MG 24 hr capsule Commonly known as:  CARDIZEM CD Take 240 mg by mouth daily.   finasteride 5 MG tablet Commonly known as:  PROSCAR Take 5 mg by mouth daily.   hydrochlorothiazide 25 MG tablet Commonly known as:  HYDRODIURIL Take 25 mg by mouth daily.   hydroxyurea 500 MG capsule Commonly known as:  HYDREA Take 1 capsule (500 mg total) by mouth daily.   levothyroxine 25 MCG tablet Commonly known as:  SYNTHROID, LEVOTHROID Take 25 mcg by mouth  daily.   lisinopril 20 MG tablet Commonly known as:  PRINIVIL,ZESTRIL Take 20 mg by mouth daily.   meclizine 25 MG tablet Commonly known as:  ANTIVERT Take 25 mg by mouth as needed. Reported on 11/11/2015   mirtazapine 15 MG tablet Commonly known as:  REMERON Take 15 mg by mouth at bedtime.   oxybutynin 5 MG tablet Commonly known as:  DITROPAN Take 5 mg by mouth 2 (two) times daily.   pantoprazole 40 MG tablet Commonly known as:  PROTONIX Take 40 mg by mouth daily.   psyllium 58.6 % powder Commonly known as:  METAMUCIL Take 1 packet by mouth 3 (three) times daily. Reported on 11/11/2015   tamsulosin 0.4 MG Caps capsule Commonly known as:  FLOMAX Take 0.4 mg by mouth daily.   tiZANidine 4 MG tablet Commonly known as:  ZANAFLEX Take 4 mg by mouth daily. Reported on 11/11/2015       SURGICAL HISTORY:  Past Surgical History:  Procedure Laterality Date  . BACK SURGERY    . COLONOSCOPY  2003  . COLONOSCOPY N/A 01/17/2013   Procedure: COLONOSCOPY;  Surgeon: MaGarlan FairMD;  Location: [facility];  Service: Endoscopy;  Laterality: N/A;  . exp lap for trauma    . HERNIA REPAIR    . INCISION AND DRAINAGE ABSCESS ANAL  08/01/05  . PROSTATE SURGERY    . SHOULDER SURGERY  rt    REVIEW OF SYSTEMS:   Constitutional: Denies fevers, chills or abnormal weight loss Eyes: Denies blurriness of vision Ears, nose, mouth, throat, and face: Denies mucositis or sore throat Respiratory: Denies cough, dyspnea or wheezes Cardiovascular: Denies palpitation, chest discomfort or lower extremity swelling Gastrointestinal:  Denies nausea, heartburn or change in bowel habits Skin: Denies abnormal skin rashes Lymphatics: Denies new lymphadenopathy or easy bruising Neurological:Denies numbness, tingling or new weaknesses Behavioral/Psych: Mood is stable, no new changes  All other systems were reviewed with the patient and are negative.  PHYSICAL EXAMINATION: ECOG PERFORMANCE  STATUS:1  There were no vitals taken for this visit. GENERAL:alert, no distress and comfortable SKIN: skin color, texture, turgor are normal, no rashes or significant lesions EYES: normal, Conjunctiva are pink and non-injected, sclera clear OROPHARYNX:no exudate, no erythema and lips, buccal mucosa, and tongue normal  NECK: supple, thyroid normal size, non-tender, without nodularity LYMPH:  no palpable lymphadenopathy in the cervical, axillary or supraclavicular LUNGS: clear to auscultation and percussion with normal breathing effort HEART: regular rate & rhythm and no murmurs and no lower extremity edema ABDOMEN:abdomen soft, non-tender and normal bowel sounds Musculoskeletal:no cyanosis of digits and no clubbing  NEURO: alert & oriented x 3 with fluent speech, no focal motor/sensory deficits   LABORATORY DATA: CBC Latest Ref Rng & Units 11/11/2015 08/30/2015 07/05/2015  WBC 4.0 - 10.3 10e3/uL 4.9 5.1 5.1  Hemoglobin 13.0 - 17.1 g/dL 12.5(L) 12.1(L) 13.2  Hematocrit 38.4 - 49.9 % 38.7 36.6(L) 40.3  Platelets 140 - 400 10e3/uL 343 363 284    CMP Latest Ref Rng & Units 11/11/2015 08/30/2015 07/05/2015  Glucose 70 - 140 mg/dl 124 88 80  BUN 7.0 - 26.0 mg/dL 10.4 14.1 14.2  Creatinine 0.7 - 1.3 mg/dL 1.0 1.0 1.2  Sodium 136 - 145 mEq/L 143 139 142  Potassium 3.5 - 5.1 mEq/L 3.4(L) 3.7 4.1  Chloride 96 - 112 mEq/L - - -  CO2 22 - 29 mEq/L _0 Calcium 8.4 - 10.4 mg/dL 9.5 9.5 9.7  Total Protein 6.4 - 8.3 g/dL 7.0 7.1 7.6  Total Bilirubin 0.20 - 1.20 mg/dL 0.55 0.36 0.45  Alkaline Phos 40 - 150 U/L 49 55 65  AST 5 - 34 U/L _1 ALT 0 - 55 U/L <9 <9 11      RADIOGRAPHIC STUDIES: No results found.  ASSESSMENT: Ricky Santos 82 y.o. male with a history of No diagnosis found.   PLAN:  1. Essential Thrombocytosis. JAK2(+) -He was diagnosed in March 2014, initial platelet count in the 500k range, JAK2 mutation (+), he did not have bone marrow biopsy.  -We discussed that this  is a myeloproliferative disorder, people usually do very well, and there is small percentage pts will develop myelofibrosis and AML late on.  -We also reviewed the increased risks of thrombosis from ET, which is the main complication.  -anagrelide did not control his plt well, could be related to its low dose -since his dizziness is unlikely related to hydrea, he has switched back to hydrea,  And his platelet count came down to normal again. We'll continue Hydrea 500 mg daily. He is tolerating well.  2. Persistent dizziness -unlikely related to hydrea -Resolved now -He previously participated a course of inner ear balance PT without any success  - he will follow-up with Dr. Milinda Hirschfeld  Plan - continue Hydrea 500 mg daily -repeat CBC in  2 months and I will see him back  in 4 months with lab   All questions were answered. The patient knows to call the clinic with any problems, questions or concerns. We can certainly see the patient much sooner if necessary.  I spent 20 minutes counseling the patient face to face. The total time spent in the appointment was 25 minutes.    Truitt Merle, MD 03/02/16

## 2016-03-03 ENCOUNTER — Telehealth: Payer: Self-pay | Admitting: Hematology

## 2016-03-03 NOTE — Telephone Encounter (Signed)
Called to r/s ftka. Unable to reach pt. No appts made.

## 2016-03-04 ENCOUNTER — Telehealth: Payer: Self-pay | Admitting: Hematology

## 2016-03-04 NOTE — Telephone Encounter (Signed)
lvm to inform pt of r/s appt to 10/16 per LOS

## 2016-03-16 ENCOUNTER — Other Ambulatory Visit: Payer: Medicare Other

## 2016-03-16 ENCOUNTER — Encounter: Payer: Medicare Other | Admitting: Hematology

## 2016-03-16 NOTE — Progress Notes (Signed)
No show  This encounter was created in error - please disregard.

## 2016-03-19 ENCOUNTER — Telehealth: Payer: Self-pay | Admitting: Hematology

## 2016-03-19 NOTE — Telephone Encounter (Signed)
lvm to inform pt of r/s appt to 10/30 at 2 pm per LOS

## 2016-03-30 ENCOUNTER — Other Ambulatory Visit: Payer: Medicare Other

## 2016-03-30 ENCOUNTER — Ambulatory Visit: Payer: Medicare Other | Admitting: Hematology

## 2016-03-30 NOTE — Progress Notes (Deleted)
[place], [place] [address] [place] [address]  DIAGNOSIS: No diagnosis found.   PREVIOUS THERAPY: Hydrea 500 mg daily, from 08/2012 to 04/01/2015, stopped on 04/02/2015 due to recurrent episodes of dizziness, restarted on 06/08/2015. He tried anagrelide 0.63m bid for one month in 05/2015 which was not effective   CURRENT THERAPY:  hydrea 502mdaily   INTERVAL HISTORY: Ricky BRISCO76.o. male with a history of Essential Thrombocytosis with JAK-2 mutation positive is here for follow-up. He is doing well overall, has not had any dizziness lately. He is compliant with Hydrea and tolerating well, no other new complaints.  MEDICAL HISTORY: Past Medical History:  Diagnosis Date  . Back pain   . Essential thrombocytosis (HCPanacea3/2014   JAK-2 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   . Essential thrombocytosis (HCLanare3/28/2014  . GERD (gastroesophageal reflux disease)   . Hernia   . Hyperlipidemia   . Hypertension   . Hypothyroid   . [facility]    INTERIM HISTORY: has Essential thrombocytosis (HCC) and Dizziness on his problem list.    ALLERGIES:  has No Known Allergies.  MEDICATIONS:    Medication List       Accurate as of 03/30/16  8:20 AM. Always use your most recent med list.          aspirin 81 MG tablet Take 81 mg by mouth daily.   atorvastatin 40 MG tablet Commonly known as:  LIPITOR Take 40 mg by mouth daily.   diltiazem 240 MG 24 hr capsule Commonly known as:  CARDIZEM CD Take 240 mg by mouth daily.   finasteride 5 MG tablet Commonly known as:  PROSCAR Take 5 mg by mouth daily.   hydrochlorothiazide 25 MG tablet Commonly known as:  HYDRODIURIL Take 25 mg by mouth daily.   hydroxyurea 500 MG capsule Commonly known as:  HYDREA Take 1 capsule (500 mg total) by mouth daily.   levothyroxine 25 MCG tablet Commonly known as:  SYNTHROID, LEVOTHROID Take 25 mcg by mouth  daily.   lisinopril 20 MG tablet Commonly known as:  PRINIVIL,ZESTRIL Take 20 mg by mouth daily.   meclizine 25 MG tablet Commonly known as:  ANTIVERT Take 25 mg by mouth as needed. Reported on 11/11/2015   mirtazapine 15 MG tablet Commonly known as:  REMERON Take 15 mg by mouth at bedtime.   oxybutynin 5 MG tablet Commonly known as:  DITROPAN Take 5 mg by mouth 2 (two) times daily.   pantoprazole 40 MG tablet Commonly known as:  PROTONIX Take 40 mg by mouth daily.   psyllium 58.6 % powder Commonly known as:  METAMUCIL Take 1 packet by mouth 3 (three) times daily. Reported on 11/11/2015   tamsulosin 0.4 MG Caps capsule Commonly known as:  FLOMAX Take 0.4 mg by mouth daily.   tiZANidine 4 MG tablet Commonly known as:  ZANAFLEX Take 4 mg by mouth daily. Reported on 11/11/2015       SURGICAL HISTORY:  Past Surgical History:  Procedure Laterality Date  . BACK SURGERY    . COLONOSCOPY  2003  . COLONOSCOPY N/A 01/17/2013   Procedure: COLONOSCOPY;  Surgeon: MaGarlan FairMD;  Location: [facility];  Service: Endoscopy;  Laterality: N/A;  . exp lap for trauma    . HERNIA REPAIR    . INCISION AND DRAINAGE ABSCESS ANAL  08/01/05  . PROSTATE SURGERY    . SHOULDER SURGERY  rt    REVIEW OF SYSTEMS:   Constitutional: Denies fevers, chills or abnormal weight loss Eyes: Denies blurriness of vision Ears, nose, mouth, throat, and face: Denies mucositis or sore throat Respiratory: Denies cough, dyspnea or wheezes Cardiovascular: Denies palpitation, chest discomfort or lower extremity swelling Gastrointestinal:  Denies nausea, heartburn or change in bowel habits Skin: Denies abnormal skin rashes Lymphatics: Denies new lymphadenopathy or easy bruising Neurological:Denies numbness, tingling or new weaknesses Behavioral/Psych: Mood is stable, no new changes  All other systems were reviewed with the patient and are negative.  PHYSICAL EXAMINATION: ECOG PERFORMANCE  STATUS:1  There were no vitals taken for this visit. GENERAL:alert, no distress and comfortable SKIN: skin color, texture, turgor are normal, no rashes or significant lesions EYES: normal, Conjunctiva are pink and non-injected, sclera clear OROPHARYNX:no exudate, no erythema and lips, buccal mucosa, and tongue normal  NECK: supple, thyroid normal size, non-tender, without nodularity LYMPH:  no palpable lymphadenopathy in the cervical, axillary or supraclavicular LUNGS: clear to auscultation and percussion with normal breathing effort HEART: regular rate & rhythm and no murmurs and no lower extremity edema ABDOMEN:abdomen soft, non-tender and normal bowel sounds Musculoskeletal:no cyanosis of digits and no clubbing  NEURO: alert & oriented x 3 with fluent speech, no focal motor/sensory deficits   LABORATORY DATA: CBC Latest Ref Rng & Units 11/11/2015 08/30/2015 07/05/2015  WBC 4.0 - 10.3 10e3/uL 4.9 5.1 5.1  Hemoglobin 13.0 - 17.1 g/dL 12.5(L) 12.1(L) 13.2  Hematocrit 38.4 - 49.9 % 38.7 36.6(L) 40.3  Platelets 140 - 400 10e3/uL 343 363 284    CMP Latest Ref Rng & Units 11/11/2015 08/30/2015 07/05/2015  Glucose 70 - 140 mg/dl 124 88 80  BUN 7.0 - 26.0 mg/dL 10.4 14.1 14.2  Creatinine 0.7 - 1.3 mg/dL 1.0 1.0 1.2  Sodium 136 - 145 mEq/L 143 139 142  Potassium 3.5 - 5.1 mEq/L 3.4(L) 3.7 4.1  Chloride 96 - 112 mEq/L - - -  CO2 22 - 29 mEq/L _0 Calcium 8.4 - 10.4 mg/dL 9.5 9.5 9.7  Total Protein 6.4 - 8.3 g/dL 7.0 7.1 7.6  Total Bilirubin 0.20 - 1.20 mg/dL 0.55 0.36 0.45  Alkaline Phos 40 - 150 U/L 49 55 65  AST 5 - 34 U/L _1 ALT 0 - 55 U/L <9 <9 11      RADIOGRAPHIC STUDIES: No results found.  ASSESSMENT: Ricky Santos 82 y.o. male with a history of No diagnosis found.   PLAN:  1. Essential Thrombocytosis. JAK2(+) -He was diagnosed in March 2014, initial platelet count in the 500k range, JAK2 mutation (+), he did not have bone marrow biopsy.  -We discussed that this  is a myeloproliferative disorder, people usually do very well, and there is small percentage pts will develop myelofibrosis and AML late on.  -We also reviewed the increased risks of thrombosis from ET, which is the main complication.  -anagrelide did not control his plt well, could be related to its low dose -since his dizziness is unlikely related to hydrea, he has switched back to hydrea,  And his platelet count came down to normal again. We'll continue Hydrea 500 mg daily. He is tolerating well.  2. Persistent dizziness -unlikely related to hydrea -Resolved now -He previously participated a course of inner ear balance PT without any success  - he will follow-up with Dr. Milinda Hirschfeld  Plan - continue Hydrea 500 mg daily -repeat CBC in  2 months and I will see him back  in 4 months with lab   All questions were answered. The patient knows to call the clinic with any problems, questions or concerns. We can certainly see the patient much sooner if necessary.  I spent 20 minutes counseling the patient face to face. The total time spent in the appointment was 25 minutes.    Truitt Merle, MD 03/30/16

## 2016-04-06 ENCOUNTER — Telehealth: Payer: Self-pay | Admitting: Hematology

## 2016-04-06 NOTE — Telephone Encounter (Signed)
Called to r/s missed apptsd. Pt did not want to r/s at present time

## 2016-06-23 DIAGNOSIS — C61 Malignant neoplasm of prostate: Secondary | ICD-10-CM | POA: Diagnosis not present

## 2016-06-29 DIAGNOSIS — R3912 Poor urinary stream: Secondary | ICD-10-CM | POA: Diagnosis not present

## 2016-06-29 DIAGNOSIS — R351 Nocturia: Secondary | ICD-10-CM | POA: Diagnosis not present

## 2016-06-29 DIAGNOSIS — C61 Malignant neoplasm of prostate: Secondary | ICD-10-CM | POA: Diagnosis not present

## 2016-06-29 DIAGNOSIS — N401 Enlarged prostate with lower urinary tract symptoms: Secondary | ICD-10-CM | POA: Diagnosis not present

## 2016-07-13 ENCOUNTER — Other Ambulatory Visit: Payer: Self-pay | Admitting: Internal Medicine

## 2016-07-13 DIAGNOSIS — Z0000 Encounter for general adult medical examination without abnormal findings: Secondary | ICD-10-CM | POA: Diagnosis not present

## 2016-07-13 DIAGNOSIS — E049 Nontoxic goiter, unspecified: Secondary | ICD-10-CM | POA: Diagnosis not present

## 2016-07-13 DIAGNOSIS — D473 Essential (hemorrhagic) thrombocythemia: Secondary | ICD-10-CM | POA: Diagnosis not present

## 2016-07-13 DIAGNOSIS — E039 Hypothyroidism, unspecified: Secondary | ICD-10-CM | POA: Diagnosis not present

## 2016-07-13 DIAGNOSIS — I10 Essential (primary) hypertension: Secondary | ICD-10-CM | POA: Diagnosis not present

## 2016-07-13 DIAGNOSIS — E782 Mixed hyperlipidemia: Secondary | ICD-10-CM | POA: Diagnosis not present

## 2016-07-13 DIAGNOSIS — K219 Gastro-esophageal reflux disease without esophagitis: Secondary | ICD-10-CM | POA: Diagnosis not present

## 2016-07-13 DIAGNOSIS — C61 Malignant neoplasm of prostate: Secondary | ICD-10-CM | POA: Diagnosis not present

## 2016-07-13 DIAGNOSIS — R221 Localized swelling, mass and lump, neck: Secondary | ICD-10-CM

## 2016-07-13 DIAGNOSIS — Z23 Encounter for immunization: Secondary | ICD-10-CM | POA: Diagnosis not present

## 2016-07-13 DIAGNOSIS — Z1389 Encounter for screening for other disorder: Secondary | ICD-10-CM | POA: Diagnosis not present

## 2016-07-20 ENCOUNTER — Ambulatory Visit
Admission: RE | Admit: 2016-07-20 | Discharge: 2016-07-20 | Disposition: A | Discharge status: Home / Self Care | Payer: Medicare Other | Source: Ambulatory Visit | Attending: Internal Medicine | Admitting: Internal Medicine

## 2016-07-20 DIAGNOSIS — E049 Nontoxic goiter, unspecified: Secondary | ICD-10-CM | POA: Diagnosis not present

## 2016-07-20 DIAGNOSIS — R221 Localized swelling, mass and lump, neck: Secondary | ICD-10-CM

## 2016-08-21 ENCOUNTER — Telehealth: Payer: Self-pay | Admitting: *Deleted

## 2016-08-21 NOTE — Telephone Encounter (Signed)
Left message for pt to call us next week regarding refill on hydrea.  Informed that Dr Burr Medico could not fill Rx if she does not see him.

## 2016-08-24 ENCOUNTER — Telehealth: Payer: Self-pay | Admitting: Hematology

## 2016-08-24 NOTE — Telephone Encounter (Signed)
Pt called to r/s missed appt. Gave pt new appt date/time 4/16 at 1 pm

## 2016-09-07 NOTE — Progress Notes (Signed)
[place], [place] [address] [place] [address]  DIAGNOSIS: Essential thrombocytosis ([place]) - Plan: CBC with Differential, Comprehensive metabolic panel, hydroxyurea (HYDREA) 500 MG capsule   PREVIOUS THERAPY: Hydrea 500 mg daily, from 08/2012 to 04/01/2015, stopped on 04/02/2015 due to recurrent episodes of dizziness, restarted on 06/08/2015. He tried anagrelide 0.'5mg'$  bid for one month in 05/2015 which was not effective   CURRENT THERAPY:  hydrea '500mg'$  daily   INTERVAL HISTORY: Ricky Santos 83 y.o. male with a history of Essential Thrombocytosis with JAK-2 mutation positive is here for follow-up. He is doing well today. He has been taking his Hydrea once a day, tolerating well. He still has occasional dizziness. It isn't as bad as it was previously, but it is still there. He stays active throughout the day. Denies fatigue, leg swelling, or any other concerns.   MEDICAL HISTORY: Past Medical History:  Diagnosis Date  . Back pain   . Essential thrombocytosis ([place]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   . Essential thrombocytosis ([place]) 08/26/2012  . GERD (gastroesophageal reflux disease)   . Hernia   . Hyperlipidemia   . Hypertension   . Hypothyroid   . Prostate cancer [facility])     INTERIM HISTORY: has Essential thrombocytosis ([facility]) and Dizziness on his problem list.    ALLERGIES:  has No Known Allergies.  MEDICATIONS:  Allergies as of 09/14/2016   No Known Allergies     Medication List       Accurate as of 09/14/16  1:56 PM. Always use your most recent med list.          aspirin 81 MG tablet Take 81 mg by mouth daily.   atorvastatin 40 MG tablet Commonly known as:  LIPITOR Take 40 mg by mouth daily.   diltiazem 240 MG 24 hr capsule Commonly known as:  CARDIZEM CD Take 240 mg by mouth daily.   finasteride 5 MG tablet Commonly known as:  PROSCAR Take 5 mg by mouth daily.    hydrochlorothiazide 25 MG tablet Commonly known as:  HYDRODIURIL Take 25 mg by mouth daily.   hydroxyurea 500 MG capsule Commonly known as:  HYDREA Take 1 capsule (500 mg total) by mouth daily.   levothyroxine 25 MCG tablet Commonly known as:  SYNTHROID, LEVOTHROID Take 25 mcg by mouth daily.   lisinopril 20 MG tablet Commonly known as:  PRINIVIL,ZESTRIL Take 20 mg by mouth daily.   meclizine 25 MG tablet Commonly known as:  ANTIVERT Take 25 mg by mouth as needed. Reported on 11/11/2015   mirtazapine 15 MG tablet Commonly known as:  REMERON Take 15 mg by mouth at bedtime.   oxybutynin 5 MG tablet Commonly known as:  DITROPAN Take 5 mg by mouth 2 (two) times daily.   pantoprazole 40 MG tablet Commonly known as:  PROTONIX Take 40 mg by mouth daily.   psyllium 58.6 % powder Commonly known as:  METAMUCIL Take 1 packet by mouth 3 (three) times daily. Reported on 11/11/2015   tamsulosin 0.4 MG Caps capsule Commonly known as:  FLOMAX Take 0.4 mg by mouth daily.   tiZANidine 4 MG tablet Commonly known as:  ZANAFLEX Take 4 mg by mouth daily. Reported on 11/11/2015      SURGICAL HISTORY:  Past Surgical History:  Procedure Laterality Date  . BACK SURGERY    . COLONOSCOPY  2003  . COLONOSCOPY N/A 01/17/2013   Procedure: COLONOSCOPY;  Surgeon: Garlan Fair, MD;  Location: [facility];  Service: Endoscopy;  Laterality: N/A;  . exp lap for trauma    . HERNIA REPAIR    . INCISION AND DRAINAGE ABSCESS ANAL  08/01/05  . PROSTATE SURGERY    . SHOULDER SURGERY     rt    REVIEW OF SYSTEMS:   Constitutional: Denies fevers, chills or abnormal weight loss Eyes: Denies blurriness of vision Ears, nose, mouth, throat, and face: Denies mucositis or sore throat Respiratory: Denies cough, dyspnea or wheezes Cardiovascular: Denies palpitation, chest discomfort or lower extremity swelling Gastrointestinal:  Denies nausea, heartburn or change in bowel habits Skin: Denies  abnormal skin rashes Lymphatics: Denies new lymphadenopathy or easy bruising Neurological:Denies numbness, tingling or new weaknesses (+) dizziness  Behavioral/Psych: Mood is stable, no new changes  All other systems were reviewed with the patient and are negative.  PHYSICAL EXAMINATION: ECOG PERFORMANCE STATUS:1   Blood pressure (!) 153/71, pulse 83, temperature 98.2 F (36.8 C), temperature source Oral, resp. rate 18, height '5\' 9"'$  (1.753 m), weight 153 lb 9.6 oz (69.7 kg), SpO2 99 %.   GENERAL:alert, no distress and comfortable SKIN: skin color, texture, turgor are normal, no rashes or significant lesions EYES: normal, Conjunctiva are pink and non-injected, sclera clear OROPHARYNX:no exudate, no erythema and lips, buccal mucosa, and tongue normal  NECK: supple, thyroid normal size, non-tender, without nodularity LYMPH:  no palpable lymphadenopathy in the cervical, axillary or supraclavicular LUNGS: clear to auscultation and percussion with normal breathing effort HEART: regular rate & rhythm and no murmurs and no lower extremity edema ABDOMEN:abdomen soft, non-tender and normal bowel sounds Musculoskeletal:no cyanosis of digits and no clubbing  NEURO: alert & oriented x 3 with fluent speech, no focal motor/sensory deficits  LABORATORY DATA: CBC Latest Ref Rng & Units 09/14/2016 11/11/2015 08/30/2015  WBC 4.0 - 10.3 10e3/uL 6.4 4.9 5.1  Hemoglobin 13.0 - 17.1 g/dL 13.8 12.5(L) 12.1(L)  Hematocrit 38.4 - 49.9 % 41.9 38.7 36.6(L)  Platelets 140 - 400 10e3/uL 459(H) 343 363    CMP Latest Ref Rng & Units 09/14/2016 11/11/2015 08/30/2015  Glucose 70 - 140 mg/dl 123 124 88  BUN 7.0 - 26.0 mg/dL 14.3 10.4 14.1  Creatinine 0.7 - 1.3 mg/dL 1.1 1.0 1.0  Sodium 136 - 145 mEq/L 142 143 139  Potassium 3.5 - 5.1 mEq/L 4.2 3.4(L) 3.7  Chloride 96 - 112 mEq/L - - -  CO2 22 - 29 mEq/L '26 24 22  '$ Calcium 8.4 - 10.4 mg/dL 10.1 9.5 9.5  Total Protein 6.4 - 8.3 g/dL 7.6 7.0 7.1  Total Bilirubin 0.20 -  1.20 mg/dL 0.50 0.55 0.36  Alkaline Phos 40 - 150 U/L 64 49 55  AST 5 - 34 U/L '19 16 19  '$ ALT 0 - 55 U/L 9 <9 <9   RADIOGRAPHIC STUDIES:  US Soft Tissue Head/Neck 07/20/2016 IMPRESSION: No thyroid nodule meets criteria for biopsy or surveillance, as designated by the newly established ACR TI-RADS criteria.  ASSESSMENT: Ricky Santos 83 y.o. male with a history of Essential thrombocytosis ([place]) - Plan: CBC with Differential, Comprehensive metabolic panel, hydroxyurea (HYDREA) 500 MG capsule  1. Essential Thrombocytosis. JAK2(+) -He was diagnosed in March 2014, initial platelet count in the 500k range, JAK2 mutation (+), he did not have bone Santos biopsy.  -We previously discussed that this is a myeloproliferative disorder, people usually do very well, and there is small percentage pts will develop myelofibrosis and AML late on.  -We again reviewed the  increased risks of thrombosis from ET, which is the main complication.  -anagrelide did not control his plt well, could be related to its low dose -since his dizziness is unlikely related to hydrea, he has restarted hydrea  -Labs reviewed, his platelet count started elevated 459 K today, will monitor his CBC every 2 months for now, continue Hydrea 500 mg daily. He is tolerating well.  2. Intermittent dizziness -unlikely related to hydrea -He previously participated a course of inner ear balance PT without any success  -he will follow-up with Dr. Milinda Hirschfeld  Plan  -continue Hydrea 500 mg daily -repeat CBC in 2 months and I will see him back in 4 months with lab. If his thrombocytosis is controlled, we will plan to change Every 3 to months and follow-up every 6 months in the future.   All questions were answered. The patient knows to call the clinic with any problems, questions or concerns. We can certainly see the patient much sooner if necessary.  I spent 10 minutes counseling the patient face to face. The total time spent in the  appointment was 15 minutes.  This document serves as a record of services personally performed by Truitt Merle, MD. It was created on her behalf by Martinique Casey, a trained medical scribe. The creation of this record is based on the scribe's personal observations and the provider's statements to them. This document has been checked and approved by the attending provider.  I have reviewed the above documentation for accuracy and completeness and I agree with the above.  Truitt Merle, MD 09/14/2016

## 2016-09-14 ENCOUNTER — Telehealth: Payer: Self-pay | Admitting: Hematology

## 2016-09-14 ENCOUNTER — Ambulatory Visit (HOSPITAL_BASED_OUTPATIENT_CLINIC_OR_DEPARTMENT_OTHER): Payer: Medicare PPO | Admitting: Hematology

## 2016-09-14 ENCOUNTER — Other Ambulatory Visit (HOSPITAL_BASED_OUTPATIENT_CLINIC_OR_DEPARTMENT_OTHER): Payer: Medicare PPO

## 2016-09-14 ENCOUNTER — Encounter: Payer: Self-pay | Admitting: Hematology

## 2016-09-14 VITALS — BP 153/71 | HR 83 | Temp 98.2°F | Resp 18 | Ht 69.0 in | Wt 153.6 lb

## 2016-09-14 DIAGNOSIS — R42 Dizziness and giddiness: Secondary | ICD-10-CM

## 2016-09-14 DIAGNOSIS — D473 Essential (hemorrhagic) thrombocythemia: Secondary | ICD-10-CM

## 2016-09-14 LAB — COMPREHENSIVE METABOLIC PANEL
ALT: 9 U/L (ref 0–55)
ANION GAP: 8 meq/L (ref 3–11)
AST: 19 U/L (ref 5–34)
Albumin: 4.1 g/dL (ref 3.5–5.0)
Alkaline Phosphatase: 64 U/L (ref 40–150)
BUN: 14.3 mg/dL (ref 7.0–26.0)
CALCIUM: 10.1 mg/dL (ref 8.4–10.4)
CO2: 26 meq/L (ref 22–29)
CREATININE: 1.1 mg/dL (ref 0.7–1.3)
Chloride: 107 mEq/L (ref 98–109)
EGFR: 75 mL/min/{1.73_m2} — ABNORMAL LOW (ref >90)
Glucose: 123 mg/dl (ref 70–140)
POTASSIUM: 4.2 meq/L (ref 3.5–5.1)
Sodium: 142 mEq/L (ref 136–145)
Total Bilirubin: 0.5 mg/dL (ref 0.20–1.20)
Total Protein: 7.6 g/dL (ref 6.4–8.3)

## 2016-09-14 LAB — CBC WITH DIFFERENTIAL/PLATELET
BASO%: 1.2 % (ref 0.0–2.0)
BASOS ABS: 0.1 10*3/uL (ref 0.0–0.1)
EOS%: 3.5 % (ref 0.0–7.0)
Eosinophils Absolute: 0.2 10*3/uL (ref 0.0–0.5)
HCT: 41.9 % (ref 38.4–49.9)
HGB: 13.8 g/dL (ref 13.0–17.1)
LYMPH%: 24.5 % (ref 14.0–49.0)
MCH: 30.5 pg (ref 27.2–33.4)
MCHC: 33 g/dL (ref 32.0–36.0)
MCV: 92.4 fL (ref 79.3–98.0)
MONO#: 0.4 10*3/uL (ref 0.1–0.9)
MONO%: 6.7 % (ref 0.0–14.0)
NEUT#: 4.1 10*3/uL (ref 1.5–6.5)
NEUT%: 64.1 % (ref 39.0–75.0)
Platelets: 459 10*3/uL — ABNORMAL HIGH (ref 140–400)
RBC: 4.53 10*6/uL (ref 4.20–5.82)
RDW: 14.5 % (ref 11.0–14.6)
WBC: 6.4 10*3/uL (ref 4.0–10.3)
lymph#: 1.6 10*3/uL (ref 0.9–3.3)

## 2016-09-14 MED ORDER — HYDROXYUREA 500 MG PO CAPS: 500.0000 mg | ORAL_CAPSULE | Freq: Every day | ORAL | 5 refills | Status: DC

## 2016-09-14 NOTE — Telephone Encounter (Signed)
Appointments scheduled per 09/14/16 los. Patient was given a copy of the AVS report and appointment schedule per 09/14/16 los. °

## 2016-11-16 ENCOUNTER — Other Ambulatory Visit (HOSPITAL_BASED_OUTPATIENT_CLINIC_OR_DEPARTMENT_OTHER): Payer: Medicare PPO

## 2016-11-16 DIAGNOSIS — D473 Essential (hemorrhagic) thrombocythemia: Secondary | ICD-10-CM

## 2016-11-16 LAB — CBC WITH DIFFERENTIAL/PLATELET
BASO%: 0.2 % (ref 0.0–2.0)
Basophils Absolute: 0 10*3/uL (ref 0.0–0.1)
EOS ABS: 0.2 10*3/uL (ref 0.0–0.5)
EOS%: 3.8 % (ref 0.0–7.0)
HCT: 37.2 % — ABNORMAL LOW (ref 38.4–49.9)
HGB: 12.2 g/dL — ABNORMAL LOW (ref 13.0–17.1)
LYMPH%: 28.6 % (ref 14.0–49.0)
MCH: 29.7 pg (ref 27.2–33.4)
MCHC: 32.8 g/dL (ref 32.0–36.0)
MCV: 90.5 fL (ref 79.3–98.0)
MONO#: 0.5 10*3/uL (ref 0.1–0.9)
MONO%: 9 % (ref 0.0–14.0)
NEUT%: 58.4 % (ref 39.0–75.0)
NEUTROS ABS: 3.1 10*3/uL (ref 1.5–6.5)
PLATELETS: 330 10*3/uL (ref 140–400)
RBC: 4.11 10*6/uL — AB (ref 4.20–5.82)
RDW: 15.9 % — ABNORMAL HIGH (ref 11.0–14.6)
WBC: 5.2 10*3/uL (ref 4.0–10.3)
lymph#: 1.5 10*3/uL (ref 0.9–3.3)

## 2016-12-07 ENCOUNTER — Other Ambulatory Visit: Payer: Self-pay | Admitting: Internal Medicine

## 2016-12-07 ENCOUNTER — Ambulatory Visit
Admission: RE | Admit: 2016-12-07 | Discharge: 2016-12-07 | Disposition: A | Discharge status: Home / Self Care | Payer: Medicare PPO | Source: Ambulatory Visit | Attending: Internal Medicine | Admitting: Internal Medicine

## 2016-12-07 DIAGNOSIS — R059 Cough, unspecified: Secondary | ICD-10-CM

## 2016-12-07 DIAGNOSIS — R05 Cough: Secondary | ICD-10-CM

## 2016-12-07 DIAGNOSIS — I10 Essential (primary) hypertension: Secondary | ICD-10-CM | POA: Diagnosis not present

## 2017-01-11 DIAGNOSIS — I10 Essential (primary) hypertension: Secondary | ICD-10-CM | POA: Diagnosis not present

## 2017-01-11 DIAGNOSIS — R05 Cough: Secondary | ICD-10-CM | POA: Diagnosis not present

## 2017-01-11 DIAGNOSIS — K219 Gastro-esophageal reflux disease without esophagitis: Secondary | ICD-10-CM | POA: Diagnosis not present

## 2017-01-11 DIAGNOSIS — E039 Hypothyroidism, unspecified: Secondary | ICD-10-CM | POA: Diagnosis not present

## 2017-01-11 DIAGNOSIS — D473 Essential (hemorrhagic) thrombocythemia: Secondary | ICD-10-CM | POA: Diagnosis not present

## 2017-01-11 DIAGNOSIS — C61 Malignant neoplasm of prostate: Secondary | ICD-10-CM | POA: Diagnosis not present

## 2017-01-14 NOTE — Progress Notes (Signed)
Ricky Santos OFFICE PROGRESS NOTE  Ricky Low, MD Ricky Santos [address]  [place] [address]  DIAGNOSIS: Essential thrombocytosis ([place])   PREVIOUS THERAPY: Hydrea 500 mg daily, from 08/2012 to 04/01/2015, stopped on 04/02/2015 due to recurrent episodes of dizziness, restarted on 06/08/2015. He tried anagrelide 0.'5mg'$  bid for one month in 05/2015 which was not effective   CURRENT THERAPY:  hydrea '500mg'$  daily   INTERVAL HISTORY: Ricky Santos 83 y.o. male with a history of Essential Thrombocytosis with JAK-2 mutation positive is here for follow-up. He is doing well overall. He still reports intermittent dizziness and has accepted it as something he has to live with. He sits down when he becomes dizzy to avoid falling. He takes Hydrea daily and denies any issues with this medication. He does not need any refills at this time. He denies leg swelling or breathing issues. He denies any other medical events since our last visit. He does not smoke. He sees PCP Dr. Lysle Santos. He does report cough and states Dr. Lysle Santos is treating him. This cough has been going on for a while and happens in the morning with sputum.  MEDICAL HISTORY: Past Medical History:  Diagnosis Date  . Back pain   . Essential thrombocytosis ([place]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   . Essential thrombocytosis ([place]) 08/26/2012  . GERD (gastroesophageal reflux disease)   . Hernia   . Hyperlipidemia   . Hypertension   . Hypothyroid   . Prostate cancer [facility])     INTERIM HISTORY: has Essential thrombocytosis ([facility]) and Dizziness on his problem list.    ALLERGIES:  has No Known Allergies.  MEDICATIONS:  Allergies as of 01/18/2017   No Known Allergies     Medication List       Accurate as of 01/18/17 11:42 PM. Always use your most recent med list.          aspirin 81 MG tablet Take 81 mg by mouth daily.   atorvastatin 40 MG tablet Commonly known as:  LIPITOR Take 40 mg  by mouth daily.   diltiazem 240 MG 24 hr capsule Commonly known as:  CARDIZEM CD Take 240 mg by mouth daily.   finasteride 5 MG tablet Commonly known as:  PROSCAR Take 5 mg by mouth daily.   hydrochlorothiazide 25 MG tablet Commonly known as:  HYDRODIURIL Take 25 mg by mouth daily.   hydroxyurea 500 MG capsule Commonly known as:  HYDREA Take 1 capsule (500 mg total) by mouth daily.   levothyroxine 25 MCG tablet Commonly known as:  SYNTHROID, LEVOTHROID Take 25 mcg by mouth daily.   lisinopril 20 MG tablet Commonly known as:  PRINIVIL,ZESTRIL Take 20 mg by mouth daily.   meclizine 25 MG tablet Commonly known as:  ANTIVERT Take 25 mg by mouth as needed. Reported on 11/11/2015   mirtazapine 15 MG tablet Commonly known as:  REMERON Take 15 mg by mouth at bedtime.   oxybutynin 5 MG tablet Commonly known as:  DITROPAN Take 5 mg by mouth 2 (two) times daily.   pantoprazole 40 MG tablet Commonly known as:  PROTONIX Take 40 mg by mouth daily.   psyllium 58.6 % powder Commonly known as:  METAMUCIL Take 1 packet by mouth 3 (three) times daily. Reported on 11/11/2015   tamsulosin 0.4 MG Caps capsule Commonly known as:  FLOMAX Take 0.4 mg by mouth daily.   tiZANidine 4 MG tablet Commonly known as:  ZANAFLEX Take 4  mg by mouth daily. Reported on 11/11/2015      SURGICAL HISTORY:  Past Surgical History:  Procedure Laterality Date  . BACK SURGERY    . COLONOSCOPY  2003  . COLONOSCOPY N/A 01/17/2013   Procedure: COLONOSCOPY;  Surgeon: Garlan Fair, MD;  Location: [facility];  Service: Endoscopy;  Laterality: N/A;  . exp lap for trauma    . HERNIA REPAIR    . INCISION AND DRAINAGE ABSCESS ANAL  08/01/05  . PROSTATE SURGERY    . SHOULDER SURGERY     rt    REVIEW OF SYSTEMS:   Constitutional: Denies fevers, chills or abnormal weight loss Eyes: Denies blurriness of vision Ears, nose, mouth, throat, and face: Denies mucositis or sore throat Respiratory: Denies  dyspnea or wheezes (+) productive cough in the morning Cardiovascular: Denies palpitation, chest discomfort or lower extremity swelling Gastrointestinal:  Denies nausea, heartburn or change in bowel habits Skin: Denies abnormal skin rashes Lymphatics: Denies new lymphadenopathy or easy bruising Neurological:Denies numbness, tingling or new weaknesses (+) intermittent dizziness  Behavioral/Psych: Mood is stable, no new changes  All other systems were reviewed with the patient and are negative.  PHYSICAL EXAMINATION: ECOG PERFORMANCE STATUS:1   Blood pressure 138/73, pulse 79, temperature 99.2 F (37.3 C), temperature source Oral, resp. rate 20, height '5\' 9"'$  (1.753 m), weight 154 lb 6.4 oz (70 kg), SpO2 100 %.   GENERAL:alert, no distress and comfortable SKIN: skin color, texture, turgor are normal, no rashes or significant lesions EYES: normal, Conjunctiva are pink and non-injected, sclera clear OROPHARYNX:no exudate, no erythema and lips, buccal mucosa, and tongue normal  NECK: supple, thyroid normal size, non-tender, without nodularity LYMPH:  no palpable lymphadenopathy in the cervical, axillary or supraclavicular LUNGS: clear to auscultation and percussion with normal breathing effort HEART: regular rate & rhythm and no murmurs and no lower extremity edema ABDOMEN:abdomen soft, non-tender and normal bowel sounds Musculoskeletal:no cyanosis of digits and no clubbing  NEURO: alert & oriented x 3 with fluent speech, no focal motor/sensory deficits  LABORATORY DATA: CBC Latest Ref Rng & Units 01/18/2017 11/16/2016 09/14/2016  WBC 4.0 - 10.3 10e3/uL 4.4 5.2 6.4  Hemoglobin 13.0 - 17.1 g/dL 12.0(L) 12.2(L) 13.8  Hematocrit 38.4 - 49.9 % 38.1(L) 37.2(L) 41.9  Platelets 140 - 400 10e3/uL 319 330 459(H)    CMP Latest Ref Rng & Units 09/14/2016 11/11/2015 08/30/2015  Glucose 70 - 140 mg/dl 123 124 88  BUN 7.0 - 26.0 mg/dL 14.3 10.4 14.1  Creatinine 0.7 - 1.3 mg/dL 1.1 1.0 1.0  Sodium 136 -  145 mEq/L 142 143 139  Potassium 3.5 - 5.1 mEq/L 4.2 3.4(L) 3.7  Chloride 96 - 112 mEq/L - - -  CO2 22 - 29 mEq/L '26 24 22  '$ Calcium 8.4 - 10.4 mg/dL 10.1 9.5 9.5  Total Protein 6.4 - 8.3 g/dL 7.6 7.0 7.1  Total Bilirubin 0.20 - 1.20 mg/dL 0.50 0.55 0.36  Alkaline Phos 40 - 150 U/L 64 49 55  AST 5 - 34 U/L '19 16 19  '$ ALT 0 - 55 U/L 9 <9 <9   RADIOGRAPHIC STUDIES:  US Soft Tissue Head/Neck 07/20/2016 IMPRESSION: No thyroid nodule meets criteria for biopsy or surveillance, as designated by the newly established ACR TI-RADS criteria.  ASSESSMENT: Ricky Santos 83 y.o. male with a history of Essential thrombocytosis ([place])  1. Essential Thrombocytosis. JAK2(+) -He was diagnosed in March 2014, initial platelet count in the 500k range, JAK2 mutation (+), he did not have bone  Santos biopsy.  -We previously discussed that this is a myeloproliferative disorder, people usually do very well, and there is small percentage pts will develop myelofibrosis and AML late on.  -We again reviewed the increased risks of thrombosis from ET, which is the main complication.  -anagrelide did not control his plt well, could be related to its Santos dose -since his dizziness is unlikely related to hydrea, he has restarted hydrea  -Labs reviewed, his platelet count 319 K today, will monitor his CBC every 3 months, continue Hydrea 500 mg daily. He is tolerating well. Continue indefinitely   2. Intermittent dizziness -unlikely related to hydrea -He previously participated a course of inner ear balance PT without any success  -he will follow-up with Dr. Lysle Santos  3. Productive cough in mornings -Likely due to post-nasal drip -He sees Dr. Lysle Santos for this  Plan  -continue Hydrea 500 mg daily -repeat CBC in 3 months and I will see him back in 6 months with lab.    All questions were answered. The patient knows to call the clinic with any problems, questions or concerns. We can certainly see the patient much sooner if  necessary.  I spent 10 minutes counseling the patient face to face. The total time spent in the appointment was 15 minutes.  This document serves as a record of services personally performed by Truitt Merle, MD. It was created on her behalf by Arlyce Harman, a trained medical scribe. The creation of this record is based on the scribe's personal observations and the provider's statements to them. This document has been checked and approved by the attending provider.  I have reviewed the above documentation for accuracy and completeness and I agree with the above.  Truitt Merle, MD 01/18/2017

## 2017-01-18 ENCOUNTER — Ambulatory Visit (HOSPITAL_BASED_OUTPATIENT_CLINIC_OR_DEPARTMENT_OTHER): Payer: Medicare PPO | Admitting: Hematology

## 2017-01-18 ENCOUNTER — Telehealth: Payer: Self-pay | Admitting: Hematology

## 2017-01-18 ENCOUNTER — Other Ambulatory Visit (HOSPITAL_BASED_OUTPATIENT_CLINIC_OR_DEPARTMENT_OTHER): Payer: Medicare PPO

## 2017-01-18 VITALS — BP 138/73 | HR 79 | Temp 99.2°F | Resp 20 | Ht 69.0 in | Wt 154.4 lb

## 2017-01-18 DIAGNOSIS — R42 Dizziness and giddiness: Secondary | ICD-10-CM | POA: Diagnosis not present

## 2017-01-18 DIAGNOSIS — R05 Cough: Secondary | ICD-10-CM

## 2017-01-18 DIAGNOSIS — D473 Essential (hemorrhagic) thrombocythemia: Secondary | ICD-10-CM

## 2017-01-18 LAB — CBC WITH DIFFERENTIAL/PLATELET
BASO%: 2.3 % — ABNORMAL HIGH (ref 0.0–2.0)
BASOS ABS: 0.1 10*3/uL (ref 0.0–0.1)
EOS ABS: 0.2 10*3/uL (ref 0.0–0.5)
EOS%: 4.8 % (ref 0.0–7.0)
HEMATOCRIT: 38.1 % — AB (ref 38.4–49.9)
HEMOGLOBIN: 12 g/dL — AB (ref 13.0–17.1)
LYMPH#: 1.6 10*3/uL (ref 0.9–3.3)
LYMPH%: 36.8 % (ref 14.0–49.0)
MCH: 29.6 pg (ref 27.2–33.4)
MCHC: 31.5 g/dL — ABNORMAL LOW (ref 32.0–36.0)
MCV: 93.8 fL (ref 79.3–98.0)
MONO#: 0.4 10*3/uL (ref 0.1–0.9)
MONO%: 8.7 % (ref 0.0–14.0)
NEUT%: 47.4 % (ref 39.0–75.0)
NEUTROS ABS: 2.1 10*3/uL (ref 1.5–6.5)
Platelets: 319 10*3/uL (ref 140–400)
RBC: 4.06 10*6/uL — ABNORMAL LOW (ref 4.20–5.82)
RDW: 16.2 % — AB (ref 11.0–14.6)
WBC: 4.4 10*3/uL (ref 4.0–10.3)

## 2017-01-18 NOTE — Telephone Encounter (Signed)
Gave pt avs and calendars with upcoming appts.  °

## 2017-01-23 ENCOUNTER — Encounter: Payer: Self-pay | Admitting: Hematology

## 2017-04-19 ENCOUNTER — Other Ambulatory Visit (HOSPITAL_BASED_OUTPATIENT_CLINIC_OR_DEPARTMENT_OTHER): Payer: Medicare PPO

## 2017-04-19 DIAGNOSIS — D473 Essential (hemorrhagic) thrombocythemia: Secondary | ICD-10-CM

## 2017-04-19 LAB — CBC WITH DIFFERENTIAL/PLATELET
BASO%: 0.9 % (ref 0.0–2.0)
Basophils Absolute: 0 10*3/uL (ref 0.0–0.1)
EOS ABS: 0.2 10*3/uL (ref 0.0–0.5)
EOS%: 4.1 % (ref 0.0–7.0)
HCT: 35.9 % — ABNORMAL LOW (ref 38.4–49.9)
HGB: 11.5 g/dL — ABNORMAL LOW (ref 13.0–17.1)
LYMPH%: 28.4 % (ref 14.0–49.0)
MCH: 28.2 pg (ref 27.2–33.4)
MCHC: 32.1 g/dL (ref 32.0–36.0)
MCV: 88.1 fL (ref 79.3–98.0)
MONO#: 0.4 10*3/uL (ref 0.1–0.9)
MONO%: 7.5 % (ref 0.0–14.0)
NEUT%: 59.1 % (ref 39.0–75.0)
NEUTROS ABS: 2.9 10*3/uL (ref 1.5–6.5)
Platelets: 321 10*3/uL (ref 140–400)
RBC: 4.07 10*6/uL — AB (ref 4.20–5.82)
RDW: 16.3 % — AB (ref 11.0–14.6)
WBC: 4.8 10*3/uL (ref 4.0–10.3)
lymph#: 1.4 10*3/uL (ref 0.9–3.3)

## 2017-04-19 LAB — COMPREHENSIVE METABOLIC PANEL
ALBUMIN: 3.6 g/dL (ref 3.5–5.0)
ALK PHOS: 53 U/L (ref 40–150)
ALT: 9 U/L (ref 0–55)
ANION GAP: 9 meq/L (ref 3–11)
AST: 17 U/L (ref 5–34)
BILIRUBIN TOTAL: 0.54 mg/dL (ref 0.20–1.20)
BUN: 12.5 mg/dL (ref 7.0–26.0)
CALCIUM: 9 mg/dL (ref 8.4–10.4)
CO2: 24 mEq/L (ref 22–29)
CREATININE: 1.1 mg/dL (ref 0.7–1.3)
Chloride: 107 mEq/L (ref 98–109)
Glucose: 99 mg/dl (ref 70–140)
Potassium: 3.6 mEq/L (ref 3.5–5.1)
Sodium: 140 mEq/L (ref 136–145)
TOTAL PROTEIN: 7.1 g/dL (ref 6.4–8.3)

## 2017-06-25 DIAGNOSIS — C61 Malignant neoplasm of prostate: Secondary | ICD-10-CM | POA: Diagnosis not present

## 2017-07-01 DIAGNOSIS — E039 Hypothyroidism, unspecified: Secondary | ICD-10-CM | POA: Diagnosis not present

## 2017-07-01 DIAGNOSIS — N400 Enlarged prostate without lower urinary tract symptoms: Secondary | ICD-10-CM | POA: Diagnosis not present

## 2017-07-01 DIAGNOSIS — E782 Mixed hyperlipidemia: Secondary | ICD-10-CM | POA: Diagnosis not present

## 2017-07-01 DIAGNOSIS — C61 Malignant neoplasm of prostate: Secondary | ICD-10-CM | POA: Diagnosis not present

## 2017-07-01 DIAGNOSIS — I10 Essential (primary) hypertension: Secondary | ICD-10-CM | POA: Diagnosis not present

## 2017-07-05 DIAGNOSIS — N401 Enlarged prostate with lower urinary tract symptoms: Secondary | ICD-10-CM | POA: Diagnosis not present

## 2017-07-05 DIAGNOSIS — R3912 Poor urinary stream: Secondary | ICD-10-CM | POA: Diagnosis not present

## 2017-07-05 DIAGNOSIS — C61 Malignant neoplasm of prostate: Secondary | ICD-10-CM | POA: Diagnosis not present

## 2017-07-16 NOTE — Progress Notes (Signed)
[facility] OFFICE PROGRESS NOTE  Ricky Low, MD [place] [address] [place] [address]  DIAGNOSIS: Essential thrombocytosis ([place]) - Plan: hydroxyurea (HYDREA) 500 MG capsule   PREVIOUS THERAPY: Hydrea 500 mg daily, from 08/2012 to 04/01/2015, stopped on 04/02/2015 due to recurrent episodes of dizziness, restarted on 06/08/2015. He tried anagrelide 0.62m bid for one month in 05/2015 which was not effective. Startled Hydrea 500 mg Monday-Saturday, hold on sundays due to anemia on 07/19/17  CURRENT THERAPY:  hydrea 5034mdaily, will decrease to 50069maily except Sundays on 07/19/2017  INTERVAL HISTORY: Ricky Santos 84o. male with a history of Essential Thrombocytosis with JAK-2 mutation positive is here for follow-up. He is doing well overall. He is compliant with Hydrea and reports no complaints. He still reports intermittent dizziness and has accepted it as something he has to live with. He sits down when he becomes dizzy to avoid falling.   On review of systems, pt denies recent illness, abnormal bleeding, recent hospitalization, change in medication, abdominal pain, or any other complaints at this time. Pertinent positives are listed and detailed within the above HPI.   MEDICAL HISTORY: Past Medical History:  Diagnosis Date  . Back pain   . Essential thrombocytosis ([facility]/2014   JAK-2 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   . Essential thrombocytosis ([facility]/2014  . GERD (gastroesophageal reflux disease)   . Hernia   . Hyperlipidemia   . Hypertension   . Hypothyroid   [facility]   INTERIM HISTORY: has Essential thrombocytosis ([facility]) and Dizziness on their problem list.    ALLERGIES:  has No Known Allergies.  MEDICATIONS:  Allergies as of 07/19/2017   No Known Allergies     Medication List        Accurate as of 07/19/17  6:17 PM. Always use your most recent med list.          aspirin 81 MG tablet Take 81 mg by  mouth daily.   atorvastatin 40 MG tablet Commonly known as:  LIPITOR Take 40 mg by mouth daily.   diltiazem 240 MG 24 hr capsule Commonly known as:  CARDIZEM CD Take 240 mg by mouth daily.   finasteride 5 MG tablet Commonly known as:  PROSCAR Take 5 mg by mouth daily.   hydrochlorothiazide 25 MG tablet Commonly known as:  HYDRODIURIL Take 25 mg by mouth daily.   hydroxyurea 500 MG capsule Commonly known as:  HYDREA Take 1 capsule (500 mg total) by mouth daily. Take 1 cap daily except Sundays   levothyroxine 25 MCG tablet Commonly known as:  SYNTHROID, LEVOTHROID Take 25 mcg by mouth daily.   lisinopril 20 MG tablet Commonly known as:  PRINIVIL,ZESTRIL Take 20 mg by mouth daily.   meclizine 25 MG tablet Commonly known as:  ANTIVERT Take 25 mg by mouth as needed. Reported on 11/11/2015   mirtazapine 15 MG tablet Commonly known as:  REMERON Take 15 mg by mouth at bedtime.   oxybutynin 5 MG tablet Commonly known as:  DITROPAN Take 5 mg by mouth 2 (two) times daily.   pantoprazole 40 MG tablet Commonly known as:  PROTONIX Take 40 mg by mouth daily.   psyllium 58.6 % powder Commonly known as:  METAMUCIL Take 1 packet by mouth 3 (three) times daily. Reported on 11/11/2015   tamsulosin 0.4 MG Caps capsule Commonly known as:  FLOMAX Take 0.4 mg by mouth daily.   tiZANidine 4 MG  tablet Commonly known as:  ZANAFLEX Take 4 mg by mouth daily. Reported on 11/11/2015      SURGICAL HISTORY:  Past Surgical History:  Procedure Laterality Date  . BACK SURGERY    . COLONOSCOPY  2003  . COLONOSCOPY N/A 01/17/2013   Procedure: COLONOSCOPY;  Surgeon: Garlan Fair, MD;  Location: [facility];  Service: Endoscopy;  Laterality: N/A;  . exp lap for trauma    . HERNIA REPAIR    . INCISION AND DRAINAGE ABSCESS ANAL  08/01/05  . PROSTATE SURGERY    . SHOULDER SURGERY     rt    REVIEW OF SYSTEMS:   Constitutional: Denies fevers, chills or abnormal weight loss Eyes:  Denies blurriness of vision Ears, nose, mouth, throat, and face: Denies mucositis or sore throat Respiratory: Denies dyspnea or wheezes  Cardiovascular: Denies palpitation, chest discomfort or lower extremity swelling Gastrointestinal:  Denies nausea, heartburn or change in bowel habits Skin: Denies abnormal skin rashes Lymphatics: Denies new lymphadenopathy or easy bruising Neurological:Denies numbness, tingling or new weaknesses (+) intermittent dizziness  Behavioral/Psych: Mood is stable, no new changes  All other systems were reviewed with the patient and are negative.  PHYSICAL EXAMINATION: ECOG PERFORMANCE STATUS:1   Blood pressure 138/69, pulse 86, temperature 98.2 F (36.8 C), temperature source Oral, resp. rate 18, height 5' 9" (1.753 m), weight 157 lb 6.4 oz (71.4 kg), SpO2 100 %.   GENERAL:alert, no distress and comfortable SKIN: skin color, texture, turgor are normal, no rashes or significant lesions EYES: normal, Conjunctiva are pink and non-injected, sclera clear OROPHARYNX:no exudate, no erythema and lips, buccal mucosa, and tongue normal  NECK: supple, thyroid normal size, non-tender, without nodularity LYMPH:  no palpable lymphadenopathy in the cervical, axillary or supraclavicular LUNGS: clear to auscultation and percussion with normal breathing effort HEART: regular rate & rhythm and no murmurs and no lower extremity edema ABDOMEN:abdomen soft, non-tender and normal bowel sounds Musculoskeletal:no cyanosis of digits and no clubbing  NEURO: alert & oriented x 3 with fluent speech, no focal motor/sensory deficits  LABORATORY DATA: CBC Latest Ref Rng & Units 07/19/2017 04/19/2017 01/18/2017  WBC 4.0 - 10.3 K/uL 4.4 4.8 4.4  Hemoglobin 13.0 - 17.1 g/dL 10.8(L) 11.5(L) 12.0(L)  Hematocrit 38.4 - 49.9 % 34.5(L) 35.9(L) 38.1(L)  Platelets 140 - 400 K/uL 301 321 319    CMP Latest Ref Rng & Units 04/19/2017 09/14/2016 11/11/2015  Glucose 70 - 140 mg/dl 99 123 124  BUN 7.0  - 26.0 mg/dL 12.5 14.3 10.4  Creatinine 0.7 - 1.3 mg/dL 1.1 1.1 1.0  Sodium 136 - 145 mEq/L 140 142 143  Potassium 3.5 - 5.1 mEq/L 3.6 4.2 3.4(L)  Chloride 96 - 112 mEq/L - - -  CO2 22 - 29 mEq/L _0 Calcium 8.4 - 10.4 mg/dL 9.0 10.1 9.5  Total Protein 6.4 - 8.3 g/dL 7.1 7.6 7.0  Total Bilirubin 0.20 - 1.20 mg/dL 0.54 0.50 0.55  Alkaline Phos 40 - 150 U/L 53 64 49  AST 5 - 34 U/L _1 ALT 0 - 55 U/L 9 9 <9   RADIOGRAPHIC STUDIES:  US Soft Tissue Head/Neck 07/20/2016 IMPRESSION: No thyroid nodule meets criteria for biopsy or surveillance, as designated by the newly established ACR TI-RADS criteria.  ASSESSMENT: Ricky Santos 84 y.o. male with a history of Essential thrombocytosis ([place]) - Plan: hydroxyurea (HYDREA) 500 MG capsule  1. Essential Thrombocytosis. JAK2(+) -He was diagnosed in March 2014, initial platelet count in the  500k range, JAK2 mutation (+), he did not have bone Santos biopsy.  -We previously discussed that this is a myeloproliferative disorder, people usually do very well, and there is small percentage pts will develop myelofibrosis and AML late on.  -We again reviewed the increased risks of thrombosis from ET, which is the main complication.  -anagrelide did not control his plt well, could be related to its Santos dose -since his dizziness is unlikely related to hydrea, he has restarted hydrea  -Labs reviewed, his platelet count is 301K and Hgb is 10.8 today. He is mildly anemic and this seems to be a trend down over the course of the past 3 years. I discussed that this can be due to Hydrea. I recommend that he take Hydrea 6/7 days of the week now. He will not take it on Sundays. I will continue to monitor this.  -Lab in 6, 12 and 20 weeks  -F/u in 20 weeks   2. Intermittent dizziness -unlikely related to hydrea -He previously participated a course of inner ear balance PT without any success  -he will follow-up with Dr. Lysle Rubens  3. Productive cough in  mornings -Likely due to post-nasal drip -He sees Dr. Lysle Rubens for this  Plan  Hydrea 500 mg Monday - Saturday, hold sundays due to anemia  Lab in 6, 12 and 20 weeks  F/u in 20 weeks   All questions were answered. The patient knows to call the clinic with any problems, questions or concerns. We can certainly see the patient much sooner if necessary.  I spent 10 minutes counseling the patient face to face. The total time spent in the appointment was 15 minutes.  This document serves as a record of services personally performed by Truitt Merle, MD. It was created on her behalf by Theresia Bough, a trained medical scribe. The creation of this record is based on the scribe's personal observations and the provider's statements to them.   I have reviewed the above documentation for accuracy and completeness, and I agree with the above.   Truitt Merle, MD 07/19/2017 6:17 PM

## 2017-07-19 ENCOUNTER — Encounter: Payer: Self-pay | Admitting: Hematology

## 2017-07-19 ENCOUNTER — Inpatient Hospital Stay: Payer: Medicare PPO | Attending: Hematology

## 2017-07-19 ENCOUNTER — Telehealth: Payer: Self-pay | Admitting: Hematology

## 2017-07-19 ENCOUNTER — Inpatient Hospital Stay (HOSPITAL_BASED_OUTPATIENT_CLINIC_OR_DEPARTMENT_OTHER): Payer: Medicare PPO | Admitting: Hematology

## 2017-07-19 VITALS — BP 138/69 | HR 86 | Temp 98.2°F | Resp 18 | Ht 69.0 in | Wt 157.4 lb

## 2017-07-19 DIAGNOSIS — D473 Essential (hemorrhagic) thrombocythemia: Secondary | ICD-10-CM

## 2017-07-19 DIAGNOSIS — R05 Cough: Secondary | ICD-10-CM

## 2017-07-19 DIAGNOSIS — I10 Essential (primary) hypertension: Secondary | ICD-10-CM | POA: Insufficient documentation

## 2017-07-19 DIAGNOSIS — Z8546 Personal history of malignant neoplasm of prostate: Secondary | ICD-10-CM | POA: Diagnosis not present

## 2017-07-19 DIAGNOSIS — E039 Hypothyroidism, unspecified: Secondary | ICD-10-CM | POA: Insufficient documentation

## 2017-07-19 DIAGNOSIS — K219 Gastro-esophageal reflux disease without esophagitis: Secondary | ICD-10-CM | POA: Insufficient documentation

## 2017-07-19 DIAGNOSIS — E785 Hyperlipidemia, unspecified: Secondary | ICD-10-CM | POA: Diagnosis not present

## 2017-07-19 DIAGNOSIS — Z1389 Encounter for screening for other disorder: Secondary | ICD-10-CM | POA: Diagnosis not present

## 2017-07-19 DIAGNOSIS — Z7982 Long term (current) use of aspirin: Secondary | ICD-10-CM | POA: Diagnosis not present

## 2017-07-19 DIAGNOSIS — R42 Dizziness and giddiness: Secondary | ICD-10-CM | POA: Insufficient documentation

## 2017-07-19 DIAGNOSIS — Z79899 Other long term (current) drug therapy: Secondary | ICD-10-CM | POA: Diagnosis not present

## 2017-07-19 DIAGNOSIS — E782 Mixed hyperlipidemia: Secondary | ICD-10-CM | POA: Diagnosis not present

## 2017-07-19 DIAGNOSIS — M549 Dorsalgia, unspecified: Secondary | ICD-10-CM | POA: Diagnosis not present

## 2017-07-19 DIAGNOSIS — C61 Malignant neoplasm of prostate: Secondary | ICD-10-CM | POA: Diagnosis not present

## 2017-07-19 DIAGNOSIS — D649 Anemia, unspecified: Secondary | ICD-10-CM | POA: Insufficient documentation

## 2017-07-19 DIAGNOSIS — Z0000 Encounter for general adult medical examination without abnormal findings: Secondary | ICD-10-CM | POA: Diagnosis not present

## 2017-07-19 DIAGNOSIS — N400 Enlarged prostate without lower urinary tract symptoms: Secondary | ICD-10-CM | POA: Diagnosis not present

## 2017-07-19 LAB — CBC WITH DIFFERENTIAL/PLATELET
BASOS ABS: 0 10*3/uL (ref 0.0–0.1)
BASOS PCT: 0 %
EOS ABS: 0.2 10*3/uL (ref 0.0–0.5)
EOS PCT: 5 %
HCT: 34.5 % — ABNORMAL LOW (ref 38.4–49.9)
Hemoglobin: 10.8 g/dL — ABNORMAL LOW (ref 13.0–17.1)
Lymphocytes Relative: 33 %
Lymphs Abs: 1.5 10*3/uL (ref 0.9–3.3)
MCH: 25.7 pg — ABNORMAL LOW (ref 27.2–33.4)
MCHC: 31.2 g/dL — ABNORMAL LOW (ref 32.0–36.0)
MCV: 82.3 fL (ref 79.3–98.0)
Monocytes Absolute: 0.4 10*3/uL (ref 0.1–0.9)
Monocytes Relative: 10 %
Neutro Abs: 2.3 10*3/uL (ref 1.5–6.5)
Neutrophils Relative %: 52 %
PLATELETS: 301 10*3/uL (ref 140–400)
RBC: 4.19 MIL/uL — AB (ref 4.20–5.82)
RDW: 17.7 % — ABNORMAL HIGH (ref 11.0–14.6)
WBC: 4.4 10*3/uL (ref 4.0–10.3)

## 2017-07-19 MED ORDER — HYDROXYUREA 500 MG PO CAPS: 500.0000 mg | ORAL_CAPSULE | Freq: Every day | ORAL | 5 refills | Status: DC

## 2017-07-19 NOTE — Telephone Encounter (Signed)
Scheduled appt per 2/18 los - Gave patient AVS and calender per los.

## 2017-08-30 ENCOUNTER — Inpatient Hospital Stay: Payer: Medicare PPO | Attending: Hematology

## 2017-08-30 DIAGNOSIS — D473 Essential (hemorrhagic) thrombocythemia: Secondary | ICD-10-CM | POA: Insufficient documentation

## 2017-08-30 LAB — CBC WITH DIFFERENTIAL/PLATELET
BASOS ABS: 0.1 10*3/uL (ref 0.0–0.1)
BASOS PCT: 2 %
EOS ABS: 0.2 10*3/uL (ref 0.0–0.5)
EOS PCT: 6 %
HCT: 33.4 % — ABNORMAL LOW (ref 38.4–49.9)
Hemoglobin: 10.2 g/dL — ABNORMAL LOW (ref 13.0–17.1)
Lymphocytes Relative: 34 %
Lymphs Abs: 1.4 10*3/uL (ref 0.9–3.3)
MCH: 24.3 pg — ABNORMAL LOW (ref 27.2–33.4)
MCHC: 30.5 g/dL — ABNORMAL LOW (ref 32.0–36.0)
MCV: 79.6 fL (ref 79.3–98.0)
MONO ABS: 0.4 10*3/uL (ref 0.1–0.9)
Monocytes Relative: 9 %
Neutro Abs: 2 10*3/uL (ref 1.5–6.5)
Neutrophils Relative %: 49 %
PLATELETS: 435 10*3/uL — AB (ref 140–400)
RBC: 4.2 MIL/uL (ref 4.20–5.82)
RDW: 17.4 % — AB (ref 11.0–14.6)
WBC: 4.1 10*3/uL (ref 4.0–10.3)

## 2017-09-01 ENCOUNTER — Telehealth: Payer: Self-pay | Admitting: *Deleted

## 2017-09-01 NOTE — Telephone Encounter (Signed)
Pt called back regarding missed call yest.  Informed of platelet count & per Dr Burr Medico to cont to take hydrea as prescribed & verified that he is taking 500 mg daily except Sundays.

## 2017-09-01 NOTE — Telephone Encounter (Signed)
-----   Message from Truitt Merle, MD sent at 08/31/2017  4:39 PM EDT ----- Please let pt know the lab result, plt slightly elevated (I decreased his hydrea in 07/2017), will continue current dose of hydrea (6 days a week) and repeat lab as scheduled. Thanks.   Truitt Merle  08/31/2017

## 2017-09-21 DIAGNOSIS — C61 Malignant neoplasm of prostate: Secondary | ICD-10-CM | POA: Diagnosis not present

## 2017-09-21 DIAGNOSIS — I10 Essential (primary) hypertension: Secondary | ICD-10-CM | POA: Diagnosis not present

## 2017-09-21 DIAGNOSIS — E782 Mixed hyperlipidemia: Secondary | ICD-10-CM | POA: Diagnosis not present

## 2017-09-21 DIAGNOSIS — E039 Hypothyroidism, unspecified: Secondary | ICD-10-CM | POA: Diagnosis not present

## 2017-09-21 DIAGNOSIS — N400 Enlarged prostate without lower urinary tract symptoms: Secondary | ICD-10-CM | POA: Diagnosis not present

## 2017-10-11 ENCOUNTER — Inpatient Hospital Stay: Payer: Medicare PPO | Attending: Hematology

## 2017-10-11 DIAGNOSIS — D473 Essential (hemorrhagic) thrombocythemia: Secondary | ICD-10-CM | POA: Diagnosis not present

## 2017-10-11 LAB — CBC WITH DIFFERENTIAL/PLATELET
Basophils Absolute: 0.1 10*3/uL (ref 0.0–0.1)
Basophils Relative: 3 %
EOS ABS: 0.2 10*3/uL (ref 0.0–0.5)
EOS PCT: 4 %
HCT: 32.6 % — ABNORMAL LOW (ref 38.4–49.9)
HEMOGLOBIN: 9.9 g/dL — AB (ref 13.0–17.1)
LYMPHS ABS: 1.5 10*3/uL (ref 0.9–3.3)
Lymphocytes Relative: 34 %
MCH: 24.4 pg — AB (ref 27.2–33.4)
MCHC: 30.4 g/dL — AB (ref 32.0–36.0)
MCV: 80.5 fL (ref 79.3–98.0)
MONO ABS: 0.2 10*3/uL (ref 0.1–0.9)
MONOS PCT: 5 %
Neutro Abs: 2.5 10*3/uL (ref 1.5–6.5)
Neutrophils Relative %: 54 %
PLATELETS: 340 10*3/uL (ref 140–400)
RBC: 4.05 MIL/uL — ABNORMAL LOW (ref 4.20–5.82)
RDW: 18 % — AB (ref 11.0–14.6)
WBC: 4.6 10*3/uL (ref 4.0–10.3)

## 2017-10-11 LAB — COMPREHENSIVE METABOLIC PANEL
ALK PHOS: 56 U/L (ref 40–150)
ALT: 8 U/L (ref 0–55)
ANION GAP: 8 (ref 3–11)
AST: 18 U/L (ref 5–34)
Albumin: 4 g/dL (ref 3.5–5.0)
BUN: 15 mg/dL (ref 7–26)
CALCIUM: 9.3 mg/dL (ref 8.4–10.4)
CO2: 25 mmol/L (ref 22–29)
Chloride: 107 mmol/L (ref 98–109)
Creatinine, Ser: 1.34 mg/dL — ABNORMAL HIGH (ref 0.70–1.30)
GFR calc Af Amer: 56 mL/min — ABNORMAL LOW (ref >60)
GFR calc non Af Amer: 48 mL/min — ABNORMAL LOW (ref >60)
GLUCOSE: 100 mg/dL (ref 70–140)
Potassium: 4.2 mmol/L (ref 3.5–5.1)
SODIUM: 140 mmol/L (ref 136–145)
Total Bilirubin: 0.5 mg/dL (ref 0.2–1.2)
Total Protein: 7.1 g/dL (ref 6.4–8.3)

## 2017-10-12 ENCOUNTER — Telehealth: Payer: Self-pay

## 2017-10-12 NOTE — Telephone Encounter (Signed)
Per Dr. Burr Medico notified patient lab results CBC stable, continue current dose of hydrea.  Creatinine is slightly elevated instructed patient to increase water intake daily to at least 6 glasses.  Patient verbalized an understanding.

## 2017-10-12 NOTE — Telephone Encounter (Signed)
-----   Message from Truitt Merle, MD sent at 10/11/2017 11:13 PM EDT ----- Please let pt know the lab results. Her CBC is stable, continue hydrea at current dose. His Cr slightly increased, please make sure he drinks fluids adequately. Thanks   Truitt Merle  10/11/2017

## 2017-10-13 DIAGNOSIS — N401 Enlarged prostate with lower urinary tract symptoms: Secondary | ICD-10-CM | POA: Diagnosis not present

## 2017-10-13 DIAGNOSIS — R3912 Poor urinary stream: Secondary | ICD-10-CM | POA: Diagnosis not present

## 2017-10-20 DIAGNOSIS — N401 Enlarged prostate with lower urinary tract symptoms: Secondary | ICD-10-CM | POA: Diagnosis not present

## 2017-10-20 DIAGNOSIS — R3912 Poor urinary stream: Secondary | ICD-10-CM | POA: Diagnosis not present

## 2017-12-01 NOTE — Progress Notes (Signed)
[facility] OFFICE PROGRESS NOTE  Referring Physician: Wenda Low, MD [address] [place] [address]  CHIEF COMPLAINT Follow up on essential thrombocytosis  HISTORY OF PRESENT ILLNESS   PREVIOUS THERAPY: Hydrea 500 mg daily, from 08/2012 to 04/01/2015, stopped on 04/02/2015 due to recurrent episodes of dizziness, restarted on 06/08/2015. He tried anagrelide 0.'5mg'$  bid for one month in 05/2015 which was not effective. Startled Hydrea 500 mg Monday-Saturday, hold on sundays due to anemia on 07/19/17, and decreased to '500mg'$  daily except Wednesday and Sunday on 12/06/2017  CURRENT THERAPY:  hydrea '500mg'$  daily, will decrease to '500mg'$  daily except Sundays on 07/19/2017  INTERVAL HISTORY: Ricky Santos 84 y.o. male with a history of Essential Thrombocytosis with JAK-2 mutation positive is here for follow-up. He is here alone. He complains of dizziness 1-2 times a month, which is chronic and stable. He feels fatigued more than usual.  He is able to function well at home.  Denies any chest pain or dyspnea.   MEDICAL HISTORY: Past Medical History:  Diagnosis Date  . Back pain   . Essential thrombocytosis ([place]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   . Essential thrombocytosis ([place]) 08/26/2012  . GERD (gastroesophageal reflux disease)   . Hernia   . Hyperlipidemia   . Hypertension   . Hypothyroid   . Prostate cancer [facility])     INTERIM HISTORY: has Essential thrombocytosis ([place]); Dizziness; and Anemia on their problem list.    ALLERGIES:  has No Known Allergies.  MEDICATIONS:  Allergies as of 12/06/2017   No Known Allergies     Medication List        Accurate as of 12/06/17 11:29 PM. Always use your most recent med list.          aspirin 81 MG tablet Take 81 mg by mouth daily.   atorvastatin 40 MG tablet Commonly known as:  LIPITOR Take 40 mg by mouth daily.   diltiazem 240 MG 24 hr capsule Commonly known as:  CARDIZEM  CD Take 240 mg by mouth daily.   finasteride 5 MG tablet Commonly known as:  PROSCAR Take 5 mg by mouth daily.   hydrochlorothiazide 25 MG tablet Commonly known as:  HYDRODIURIL Take 25 mg by mouth daily.   hydroxyurea 500 MG capsule Commonly known as:  HYDREA Take 1 capsule (500 mg total) by mouth daily. Take 1 cap daily except Sundays   levothyroxine 25 MCG tablet Commonly known as:  SYNTHROID, LEVOTHROID Take 25 mcg by mouth daily.   lisinopril 20 MG tablet Commonly known as:  PRINIVIL,ZESTRIL Take 20 mg by mouth daily.   meclizine 25 MG tablet Commonly known as:  ANTIVERT Take 25 mg by mouth as needed. Reported on 11/11/2015   mirtazapine 15 MG tablet Commonly known as:  REMERON Take 15 mg by mouth at bedtime.   oxybutynin 5 MG tablet Commonly known as:  DITROPAN Take 5 mg by mouth 2 (two) times daily.   pantoprazole 40 MG tablet Commonly known as:  PROTONIX Take 40 mg by mouth daily.   psyllium 58.6 % powder Commonly known as:  METAMUCIL Take 1 packet by mouth 3 (three) times daily. Reported on 11/11/2015   tamsulosin 0.4 MG Caps capsule Commonly known as:  FLOMAX Take 0.4 mg by mouth daily.   tiZANidine 4 MG tablet Commonly known as:  ZANAFLEX Take 4 mg by mouth daily. Reported on 11/11/2015      SURGICAL HISTORY:  Past Surgical History:  Procedure Laterality Date  . BACK SURGERY    . COLONOSCOPY  2003  . COLONOSCOPY N/A 01/17/2013   Procedure: COLONOSCOPY;  Surgeon: Garlan Fair, MD;  [facility]: [facility];  Service: Endoscopy;  Laterality: N/A;  . exp lap for trauma    . HERNIA REPAIR    . INCISION AND DRAINAGE ABSCESS ANAL  08/01/05  . PROSTATE SURGERY    . SHOULDER SURGERY     rt    REVIEW OF SYSTEMS:   Constitutional: Denies fevers, chills or abnormal weight loss (+) fatigue Eyes: Denies blurriness of vision Ears, nose, mouth, throat, and face: Denies mucositis or sore throat Respiratory: Denies dyspnea or wheezes  Cardiovascular:  Denies palpitation, chest discomfort or lower extremity swelling Gastrointestinal:  Denies nausea, heartburn or change in bowel habits Skin: Denies abnormal skin rashes Lymphatics: Denies new lymphadenopathy or easy bruising Neurological:Denies numbness, tingling or new weaknesses (+) intermittent dizziness  Behavioral/Psych: Mood is stable, no new changes  All other systems were reviewed with the patient and are negative.  PHYSICAL EXAMINATION: ECOG PERFORMANCE STATUS:1   Blood pressure 134/75, pulse 85, temperature 98.4 F (36.9 C), temperature source Oral, resp. rate 18, height '5\' 9"'$  (2.919 m), SpO2 100 %.   GENERAL:alert, no distress and comfortable SKIN: skin color, texture, turgor are normal, no rashes or significant lesions EYES: normal, Conjunctiva are pink and non-injected, sclera clear OROPHARYNX:no exudate, no erythema and lips, buccal mucosa, and tongue normal  NECK: supple, thyroid normal size, non-tender, without nodularity LYMPH:  no palpable lymphadenopathy in the cervical, axillary or supraclavicular LUNGS: clear to auscultation and percussion with normal breathing effort HEART: regular rate & rhythm and no murmurs and no lower extremity edema ABDOMEN:abdomen soft, non-tender and normal bowel sounds Musculoskeletal:no cyanosis of digits and no clubbing  NEURO: alert & oriented x 3 with fluent speech, no focal motor/sensory deficits  LABORATORY DATA: CBC Latest Ref Rng & Units 12/06/2017 10/11/2017 08/30/2017  WBC 4.0 - 10.3 K/uL 4.8 4.6 4.1  Hemoglobin 13.0 - 17.1 g/dL 9.9(L) 9.9(L) 10.2(L)  Hematocrit 38.4 - 49.9 % 32.0(L) 32.6(L) 33.4(L)  Platelets 140 - 400 K/uL 242 340 435(H)    CMP Latest Ref Rng & Units 10/11/2017 04/19/2017 09/14/2016  Glucose 70 - 140 mg/dL 100 99 123  BUN 7 - 26 mg/dL 15 12.5 14.3  Creatinine 0.70 - 1.30 mg/dL 1.34(H) 1.1 1.1  Sodium 136 - 145 mmol/L 140 140 142  Potassium 3.5 - 5.1 mmol/L 4.2 3.6 4.2  Chloride 98 - 109 mmol/L 107 - -  CO2  22 - 29 mmol/L '25 24 26  '$ Calcium 8.4 - 10.4 mg/dL 9.3 9.0 10.1  Total Protein 6.4 - 8.3 g/dL 7.1 7.1 7.6  Total Bilirubin 0.2 - 1.2 mg/dL 0.5 0.54 0.50  Alkaline Phos 40 - 150 U/L 56 53 64  AST 5 - 34 U/L '18 17 19  '$ ALT 0 - 55 U/L '8 9 9   '$ RADIOGRAPHIC STUDIES:  US Soft Tissue Head/Neck 07/20/2016 IMPRESSION: No thyroid nodule meets criteria for biopsy or surveillance, as designated by the newly established ACR TI-RADS criteria.  ASSESSMENT:  Ricky Santos 84 y.o. male with   1. Essential Thrombocytosis. JAK2(+) -He was diagnosed in March 2014, initial platelet count in the 500k range, JAK2 mutation (+), he did not have bone Santos biopsy.  -We previously discussed that this is a myeloproliferative disorder, people usually do very well, and there is small percentage pts will develop myelofibrosis and AML late  on.  -We again reviewed the increased risks of thrombosis from ET, which is the main complication.  -Due to his intermittent dizziness, Hydrea was switched to anagrelide 0.'5mg'$  bid for one month but it did not control his plt well, could be related to its low dose.  He was switched back to [facility]  -He has gradually developed mild anemia, hemoglobin has been trending down, Hg 9.9 today, he is mildly symptomatic with fatigue.  His anemia is probably related to Hydrea. -His platelet count has been in normal range, I will decrease his Hydrea to 500 mg daily except Wednesdays and Sundays -If he has persistent worsening anemia, I will switch back to anagrelide, starting at 1 mg twice daily -f/u in one month    2. Intermittent dizziness -unlikely related to hydrea -He previously participated a course of inner ear balance PT without any success  -he will follow-up with Dr. Lysle Rubens  3. Anemia  -He has developed mild anemia since 10/2016, to be related to long-standing Hydrea -I checked his iron study, B12, folate acid level, and SPEP today, to rule out other etiology of anemia, results are  still pending  Plan -Decrease Hydrea to 500 mg daily except Wednesdays and Sundays -Lab and follow up in 1 month   All questions were answered. The patient knows to call the clinic with any problems, questions or concerns. We can certainly see the patient much sooner if necessary.  I spent 15 minutes counseling the patient face to face. The total time spent in the appointment was 20 minutes.  Dierdre Searles Dweik am acting as scribe for Dr. Truitt Merle.  I have reviewed the above documentation for accuracy and completeness, and I agree with the above.    Truitt Merle, MD 12/06/2017

## 2017-12-05 ENCOUNTER — Other Ambulatory Visit: Payer: Self-pay | Admitting: Hematology

## 2017-12-05 DIAGNOSIS — D649 Anemia, unspecified: Secondary | ICD-10-CM

## 2017-12-06 ENCOUNTER — Encounter: Payer: Self-pay | Admitting: Hematology

## 2017-12-06 ENCOUNTER — Inpatient Hospital Stay: Payer: Medicare PPO | Attending: Hematology | Admitting: Hematology

## 2017-12-06 ENCOUNTER — Inpatient Hospital Stay: Payer: Medicare PPO

## 2017-12-06 ENCOUNTER — Telehealth: Payer: Self-pay

## 2017-12-06 VITALS — BP 134/75 | HR 85 | Temp 98.4°F | Resp 18 | Ht 69.0 in

## 2017-12-06 DIAGNOSIS — D649 Anemia, unspecified: Secondary | ICD-10-CM

## 2017-12-06 DIAGNOSIS — D473 Essential (hemorrhagic) thrombocythemia: Secondary | ICD-10-CM | POA: Diagnosis not present

## 2017-12-06 DIAGNOSIS — I10 Essential (primary) hypertension: Secondary | ICD-10-CM | POA: Insufficient documentation

## 2017-12-06 DIAGNOSIS — Z8546 Personal history of malignant neoplasm of prostate: Secondary | ICD-10-CM | POA: Diagnosis not present

## 2017-12-06 DIAGNOSIS — R42 Dizziness and giddiness: Secondary | ICD-10-CM

## 2017-12-06 DIAGNOSIS — R5383 Other fatigue: Secondary | ICD-10-CM | POA: Insufficient documentation

## 2017-12-06 DIAGNOSIS — Z7982 Long term (current) use of aspirin: Secondary | ICD-10-CM | POA: Diagnosis not present

## 2017-12-06 DIAGNOSIS — K219 Gastro-esophageal reflux disease without esophagitis: Secondary | ICD-10-CM | POA: Diagnosis not present

## 2017-12-06 DIAGNOSIS — E785 Hyperlipidemia, unspecified: Secondary | ICD-10-CM | POA: Diagnosis not present

## 2017-12-06 DIAGNOSIS — Z79899 Other long term (current) drug therapy: Secondary | ICD-10-CM | POA: Diagnosis not present

## 2017-12-06 DIAGNOSIS — E039 Hypothyroidism, unspecified: Secondary | ICD-10-CM | POA: Diagnosis not present

## 2017-12-06 LAB — IRON AND TIBC
Iron: 23 ug/dL — ABNORMAL LOW (ref 42–163)
SATURATION RATIOS: 6 % — AB (ref 42–163)
TIBC: 398 ug/dL (ref 202–409)
UIBC: 375 ug/dL

## 2017-12-06 LAB — CBC WITH DIFFERENTIAL/PLATELET
BASOS ABS: 0.1 10*3/uL (ref 0.0–0.1)
Basophils Relative: 2 %
EOS PCT: 4 %
Eosinophils Absolute: 0.2 10*3/uL (ref 0.0–0.5)
HEMATOCRIT: 32 % — AB (ref 38.4–49.9)
Hemoglobin: 9.9 g/dL — ABNORMAL LOW (ref 13.0–17.1)
LYMPHS ABS: 1.9 10*3/uL (ref 0.9–3.3)
LYMPHS PCT: 40 %
MCH: 25.2 pg — ABNORMAL LOW (ref 27.2–33.4)
MCHC: 30.9 g/dL — ABNORMAL LOW (ref 32.0–36.0)
MCV: 81.4 fL (ref 79.3–98.0)
Monocytes Absolute: 0.4 10*3/uL (ref 0.1–0.9)
Monocytes Relative: 8 %
NEUTROS PCT: 46 %
Neutro Abs: 2.2 10*3/uL (ref 1.5–6.5)
PLATELETS: 242 10*3/uL (ref 140–400)
RBC: 3.93 MIL/uL — ABNORMAL LOW (ref 4.20–5.82)
RDW: 18.1 % — ABNORMAL HIGH (ref 11.0–14.6)
WBC: 4.8 10*3/uL (ref 4.0–10.3)

## 2017-12-06 LAB — VITAMIN B12: Vitamin B-12: 350 pg/mL (ref 180–914)

## 2017-12-06 LAB — RETICULOCYTES
RBC.: 3.93 MIL/uL — ABNORMAL LOW (ref 4.20–5.82)
RETIC CT PCT: 1.4 % (ref 0.8–1.8)
Retic Count, Absolute: 55 10*3/uL (ref 34.8–93.9)

## 2017-12-06 LAB — FERRITIN: Ferritin: 11 ng/mL — ABNORMAL LOW (ref 24–336)

## 2017-12-06 NOTE — Telephone Encounter (Signed)
Printed avs and calender of upcoming appointment. Per 7/8 los 

## 2017-12-07 ENCOUNTER — Other Ambulatory Visit: Payer: Self-pay | Admitting: Nurse Practitioner

## 2017-12-07 DIAGNOSIS — D473 Essential (hemorrhagic) thrombocythemia: Secondary | ICD-10-CM

## 2017-12-07 LAB — FOLATE RBC
FOLATE, RBC: 1033 ng/mL (ref >498)
Folate, Hemolysate: 329.6 ng/mL
HEMATOCRIT: 31.9 % — AB (ref 37.5–51.0)

## 2017-12-07 MED ORDER — HYDROXYUREA 500 MG PO CAPS
500.0000 mg | ORAL_CAPSULE | Freq: Every day | ORAL | 0 refills | Status: DC
Start: 2017-12-07 — End: 2018-02-14

## 2017-12-08 LAB — METHYLMALONIC ACID, SERUM: METHYLMALONIC ACID, QUANTITATIVE: 207 nmol/L (ref 0–378)

## 2017-12-09 LAB — PROTEIN ELECTROPHORESIS, SERUM, WITH REFLEX
A/G Ratio: 1.2 (ref 0.7–1.7)
ALPHA-1-GLOBULIN: 0.3 g/dL (ref 0.0–0.4)
ALPHA-2-GLOBULIN: 0.9 g/dL (ref 0.4–1.0)
Albumin ELP: 4.3 g/dL (ref 2.9–4.4)
BETA GLOBULIN: 1 g/dL (ref 0.7–1.3)
Gamma Globulin: 1.3 g/dL (ref 0.4–1.8)
Globulin, Total: 3.5 g/dL (ref 2.2–3.9)
M-SPIKE, %: 0.2 g/dL — AB
SPEP Interpretation: 0
Total Protein ELP: 7.8 g/dL (ref 6.0–8.5)

## 2017-12-09 LAB — IMMUNOFIXATION REFLEX, SERUM
IgA: 149 mg/dL (ref 61–437)
IgG (Immunoglobin G), Serum: 1344 mg/dL (ref 700–1600)
IgM (Immunoglobulin M), Srm: 61 mg/dL (ref 15–143)

## 2017-12-24 ENCOUNTER — Telehealth: Payer: Self-pay

## 2017-12-24 ENCOUNTER — Other Ambulatory Visit: Payer: Self-pay | Admitting: Hematology

## 2017-12-24 ENCOUNTER — Telehealth: Payer: Self-pay | Admitting: Hematology

## 2017-12-24 DIAGNOSIS — D509 Iron deficiency anemia, unspecified: Secondary | ICD-10-CM

## 2017-12-24 NOTE — Telephone Encounter (Signed)
Spoke with patient per Dr. Burr Medico with his lab results.  Instructed patient that he needs to start taking OTC ferrous sulfate one twice daily.  This can cause constipation.    Also I am cancelling his appointment for 8/5 and sending scheduling a message to change to 4 weeks from now.  This will give [place] time to see if his iron level improves.  Patient verbalized an understanding.

## 2017-12-24 NOTE — Telephone Encounter (Signed)
-----   Message from Truitt Merle, MD sent at 12/24/2017 11:00 AM EDT ----- Malachy Mood, please let pt know his lab results, he has mild iron deficiency, I recommend him to start OTC ferric sulfate 1 tab twice daily, watch for constipation. He also has low level abnormal M-protein, likely benign, I will explain to him on his next visit. Please change his lab and f/u with lab from 8/5 to 4 weeks from now and see me, will see if his anemia improves with oral iron in a month. Thanks much.  Truitt Merle  12/24/2017

## 2017-12-24 NOTE — Telephone Encounter (Signed)
Called pt re appts that were changed per 7/26 sch msg - spoke w/ pt re appts that were shifted. Pt stated that he couldn't come in August and was only available on Monday's. Next available date was 9/9. Spoke with patient and confirmed appts.

## 2017-12-29 ENCOUNTER — Telehealth: Payer: Self-pay

## 2017-12-29 ENCOUNTER — Telehealth: Payer: Self-pay | Admitting: Hematology

## 2017-12-29 NOTE — Telephone Encounter (Signed)
Appointments scheduled and letter/calendar mailed to patient. He also received phone call with date/ time per 7/31 sch msg

## 2017-12-29 NOTE — Telephone Encounter (Signed)
Patient called back he misunderstood the instructions.  Explained he is take the Hydrea daily except Sundays and Wednesday and not take Anagrelide.  He verbalized an understanding.

## 2017-12-29 NOTE — Telephone Encounter (Signed)
Called patient to verify that he is taking Hydrea 500 mg daily except Wednesday and Sunday and not taking Anagrelide as we had received a request from [facility] for this.  Had to leave a voice message. Requested he call back.

## 2017-12-29 NOTE — Telephone Encounter (Signed)
Left voice message for patient that Cira Rue NP would like to have him come in one day next week for lab work and f/u.  He should be receiving a phone call from scheduling.

## 2018-01-02 NOTE — Progress Notes (Addendum)
[place]  [phone] [phone]) [phone]  Clinic Follow up Note   Patient Care Team: Wenda Low, MD as PCP - General (Internal Medicine) Nobie Putnam, MD (Hematology and Oncology) Garlan Fair, MD as Attending Physician (Gastroenterology) Rana Snare, MD as Attending Physician (Urology) 01/03/2018  CHIEF COMPLAINT Follow up on essential thrombocytosis  PREVIOUS THERAPY: Hydrea 500 mg daily, from 08/2012 to 04/01/2015, stopped on 04/02/2015 due to recurrent episodes of dizziness, restarted on 06/08/2015. He tried anagrelide 0.'5mg'$  bid for one month in 05/2015 which was not effective. Startled Hydrea 500 mg Monday-Saturday, hold on sundays due to anemia on 07/19/17, and decreased to '500mg'$  daily except Wednesday and Sunday on 12/06/2017  CURRENT THERAPY:  hydrea '500mg'$  daily except Wednesday and Sunday on 12/06/2017  INTERVAL HISTORY: [name] returns for follow up as scheduled. There was some confusion as to his medication and dosing due to pharmacy refill request for anagrelide. He reports taking hydrea 500 mg daily except Wednesday and sundays since last f/u on 7/8 with Dr. Burr Medico. He has not been on anagrelide for a long time. He has been taking medication appropriately. He also began oral iron 1 tab BID at last f/u, he is tolerating it well. Appetite is normal. Having regular BMs. No blood in stool. He developed cough and exertional dyspnea 2 months ago. He feels there is thick phlegm but can not cough it up. He saw his PCP who prescribed cough syrup with codeine, which did not help much. Denies fever, chills, chest pain, or sick contacts.   REVIEW OF SYSTEMS:   Constitutional: Denies fevers, chills or abnormal weight loss Eyes: Denies blurriness of vision Ears, nose, mouth, throat, and face: Denies mucositis or sore throat Respiratory: Denies wheezes (+) 2 month history of cough with exertional dyspnea Cardiovascular: Denies palpitation, chest discomfort  or lower extremity swelling Gastrointestinal:  Denies nausea, vomiting, constipation, diarrhea, hematochezia, heartburn or change in bowel habits Skin: Denies abnormal skin rashes Lymphatics: Denies new lymphadenopathy or easy bruising Neurological:Denies numbness, tingling or new weaknesses Behavioral/Psych: Mood is stable, no new changes  All other systems were reviewed with the patient and are negative.  MEDICAL HISTORY:  Past Medical History:  Diagnosis Date  . Back pain   . Essential thrombocytosis ([place]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   . Essential thrombocytosis ([place]) 08/26/2012  . GERD (gastroesophageal reflux disease)   . Hernia   . Hyperlipidemia   . Hypertension   . Hypothyroid   . Prostate cancer [facility])     SURGICAL HISTORY: Past Surgical History:  Procedure Laterality Date  . BACK SURGERY    . COLONOSCOPY  2003  . COLONOSCOPY N/A 01/17/2013   Procedure: COLONOSCOPY;  Surgeon: Garlan Fair, MD;  Location: [facility];  Service: Endoscopy;  Laterality: N/A;  . exp lap for trauma    . HERNIA REPAIR    . INCISION AND DRAINAGE ABSCESS ANAL  08/01/05  . PROSTATE SURGERY    . SHOULDER SURGERY     rt    I have reviewed the social history and family history with the patient and they are unchanged from previous note.  ALLERGIES:  has No Known Allergies.  MEDICATIONS:  Current Outpatient Medications  Medication Sig Dispense Refill  . aspirin 81 MG tablet Take 81 mg by mouth daily.      Marland Kitchen atorvastatin (LIPITOR) 40 MG tablet Take 40 mg by mouth daily.      Marland Kitchen diltiazem (CARDIZEM CD) 240  MG 24 hr capsule Take 240 mg by mouth daily.      . finasteride (PROSCAR) 5 MG tablet Take 5 mg by mouth daily.      . hydrochlorothiazide 25 MG tablet Take 25 mg by mouth daily.      . hydroxyurea (HYDREA) 500 MG capsule Take 1 capsule (500 mg total) by mouth daily. Take 1 cap daily except Wednesdays and Sundays 30 capsule 0  . levothyroxine  (SYNTHROID, LEVOTHROID) 25 MCG tablet Take 25 mcg by mouth daily.      Marland Kitchen lisinopril (PRINIVIL,ZESTRIL) 20 MG tablet Take 20 mg by mouth daily.  6  . meclizine (ANTIVERT) 25 MG tablet Take 25 mg by mouth as needed. Reported on 11/11/2015  3  . mirtazapine (REMERON) 15 MG tablet Take 15 mg by mouth at bedtime.     Marland Kitchen oxybutynin (DITROPAN) 5 MG tablet Take 5 mg by mouth 2 (two) times daily.  6  . pantoprazole (PROTONIX) 40 MG tablet Take 40 mg by mouth daily.      . psyllium (METAMUCIL) 58.6 % powder Take 1 packet by mouth 3 (three) times daily. Reported on 11/11/2015    . Tamsulosin HCl (FLOMAX) 0.4 MG CAPS Take 0.4 mg by mouth daily.     Marland Kitchen tiZANidine (ZANAFLEX) 4 MG tablet Take 4 mg by mouth daily. Reported on 11/11/2015     No current facility-administered medications for this visit.     PHYSICAL EXAMINATION: ECOG PERFORMANCE STATUS: 1 - Symptomatic but completely ambulatory  Vitals:   01/03/18 1050  BP: 126/62  Pulse: 88  Resp: 18  Temp: 98.2 F (36.8 C)  SpO2: 100%   Filed Weights   01/03/18 1050  Weight: 149 lb (67.6 kg)    GENERAL:alert, no distress and comfortable SKIN: no rashes or significant lesions EYES: sclera clear OROPHARYNX:no thrush or ulcers LYMPH:  no palpable cervical or supraclavicular lymphadenopathy LUNGS: clear to auscultation with normal breathing effort HEART: regular rate & rhythm and no murmurs and no lower extremity edema ABDOMEN:abdomen soft, non-tender and normal bowel sounds Musculoskeletal:no cyanosis of digits and no clubbing  NEURO: alert & oriented x 3 with fluent speech, no focal motor/sensory deficits  LABORATORY DATA:  I have reviewed the data as listed CBC Latest Ref Rng & Units 01/03/2018 12/06/2017 12/06/2017  WBC 4.0 - 10.3 K/uL 4.9 4.8 -  Hemoglobin 13.0 - 17.1 g/dL 11.1(L) 9.9(L) -  Hematocrit 38.4 - 49.9 % 35.5(L) 31.9(L) 32.0(L)  Platelets 140 - 400 K/uL 290 242 -     CMP Latest Ref Rng & Units 10/11/2017 04/19/2017 09/14/2016    Glucose 70 - 140 mg/dL 100 99 123  BUN 7 - 26 mg/dL 15 12.5 14.3  Creatinine 0.70 - 1.30 mg/dL 1.34(H) 1.1 1.1  Sodium 136 - 145 mmol/L 140 140 142  Potassium 3.5 - 5.1 mmol/L 4.2 3.6 4.2  Chloride 98 - 109 mmol/L 107 - -  CO2 22 - 29 mmol/L '25 24 26  '$ Calcium 8.4 - 10.4 mg/dL 9.3 9.0 10.1  Total Protein 6.4 - 8.3 g/dL 7.1 7.1 7.6  Total Bilirubin 0.2 - 1.2 mg/dL 0.5 0.54 0.50  Alkaline Phos 40 - 150 U/L 56 53 64  AST 5 - 34 U/L '18 17 19  '$ ALT 0 - 55 U/L '8 9 9    '$ MM panel  12/06/2017 M protein is 0.2  IgG 12/06/2017 1344, immunofixation shows IgG monoclonal protein with lambda light chain specificity  RADIOGRAPHIC STUDIES: I have personally reviewed the radiological  images as listed and agreed with the findings in the report. Dg Chest 2 View  Result Date: 01/03/2018 CLINICAL DATA:  Productive cough for 2 months EXAM: CHEST - 2 VIEW COMPARISON:  12/07/2016 FINDINGS: Cardiac shadow is within normal limits. Aortic calcifications are again seen. Lungs are hyperinflated without focal infiltrate or sizable effusion. No acute bony abnormality is seen. IMPRESSION: COPD without acute abnormality. Electronically Signed   By: Inez Catalina M.D.   On: 01/03/2018 14:26     ASSESSMENT & PLAN: Ricky Santos 84 y.o. male with   1. Essential Thrombocytosis. JAK2(+) -Mr. Doescher appears stable. He continues hydrea 500 mg daily except Wednesday and Sunday, tolerating well. Platelet count remains normal. Anemia is improved. Continue current dose.   2. MGUS -M spike 0.2 with IgG monoclonal protein with lambda light chain specificity on 12/06/17. Dr. Burr Medico discussed findings with the patient.  -Cr on 10/11/17 was elevated to 1.34, normal previously; will repeat CMP at next visit  -Will obtain bone survey to rule out osseous findings.  -will follow with monthly lab   3. Intermittent dizziness  -Resolved   4. Anemia -His anemia improved after 1 month on oral iron therapy BID. Iron studies normalized.  Etiology of iron deficiency remains unclear. He had colonoscopy in 2014 per Dr. Earle Gell which the patient reports was normal.  -Pt agrees to referral back to GI for repeat colonoscopy to r/o source of iron deficiency   5. Cough with exertional dyspnea  -Chest xray today does not show evidence of pneumonia. He does have COPD, I recommend he f/u with PCP  PLAN:  -Labs reviewed, continue hydrea 500 mg daily except Wednesday and sundays  -Bone survey for MGUS, ordered today   -Refer back to GI for colonoscopy, referral order placed in Epic and noted in check out note -Repeat labs including MM panel monthly x3 -F/u in 3 months     Orders Placed This Encounter  Procedures  . DG Chest 2 View    Standing Status:   Future    Number of Occurrences:   1    Standing Expiration Date:   01/03/2019    Order Specific Question:   Reason for Exam (SYMPTOM  OR DIAGNOSIS REQUIRED)    Answer:   2 month history of cough and exertional dyspnea    Order Specific Question:   Preferred imaging location?    Answer:   [facility] [place]    Order Specific Question:   Radiology Contrast Protocol - do NOT remove file path    Answer:   \\[email or web address]\DXFluoroContrastProtocols.pdf  . DG Bone Survey Met    Standing Status:   Future    Standing Expiration Date:   03/06/2019    Order Specific Question:   Reason for Exam (SYMPTOM  OR DIAGNOSIS REQUIRED)    Answer:   MGUS    Order Specific Question:   Preferred imaging location?    Answer:   [facility]    Order Specific Question:   Radiology Contrast Protocol - do NOT remove file path    Answer:   \\[email or web address]\Radiant\DXFluoroContrastProtocols.pdf  . Multiple Myeloma Panel (SPEP&IFE w/QIG)    Standing Status:   Standing    Number of Occurrences:   10    Standing Expiration Date:   01/04/2019  . CMP ([place] only)    Standing Status:   Standing    Number of Occurrences:   50    Standing Expiration Date:   01/04/2024  .  Ambulatory referral to Gastroenterology    Referral Priority:   Routine    Referral Type:   Consultation    Referral Reason:   Specialty Services Required    Number of Visits Requested:   1   All questions were answered. The patient knows to call the clinic with any problems, questions or concerns. No barriers to learning was detected. I spent 20 minutes counseling the patient face to face. The total time spent in the appointment was 25 minutes and more than 50% was on counseling and review of test results     Alla Feeling, NP 01/03/18   Addendum  I have seen the patient, examined him. I agree with the assessment and and plan and have edited the notes.   Mr. Rothbauer has developed anemia recently, lab test results reviewed with him, he has lab evidence of iron deficiency, and his anemia has much improved since he oral iron supplement.  He will continue.  I strongly encouraged him to see GI to rule out GI bleeding.  If his anemia does not resolve after iron supplement, I will decrease his Hydrea dose.  He also has lab evidence of M protein likely MGUS, will obtain bone survey to rule out bone lesions.  Given the low level M protein, will monitor every 3 to 6 months.  Truitt Merle  01/03/2018

## 2018-01-03 ENCOUNTER — Other Ambulatory Visit: Payer: Medicare PPO

## 2018-01-03 ENCOUNTER — Encounter: Payer: Self-pay | Admitting: Nurse Practitioner

## 2018-01-03 ENCOUNTER — Telehealth: Payer: Self-pay

## 2018-01-03 ENCOUNTER — Inpatient Hospital Stay (HOSPITAL_BASED_OUTPATIENT_CLINIC_OR_DEPARTMENT_OTHER): Payer: Medicare PPO | Admitting: Nurse Practitioner

## 2018-01-03 ENCOUNTER — Ambulatory Visit: Payer: Medicare PPO | Admitting: Nurse Practitioner

## 2018-01-03 ENCOUNTER — Ambulatory Visit (HOSPITAL_COMMUNITY)
Admission: RE | Admit: 2018-01-03 | Discharge: 2018-01-03 | Disposition: A | Discharge status: Home / Self Care | Payer: Medicare PPO | Source: Ambulatory Visit | Attending: Nurse Practitioner | Admitting: Nurse Practitioner

## 2018-01-03 ENCOUNTER — Inpatient Hospital Stay: Payer: Medicare PPO | Attending: Hematology

## 2018-01-03 VITALS — BP 126/62 | HR 88 | Temp 98.2°F | Resp 18 | Ht 69.0 in | Wt 149.0 lb

## 2018-01-03 DIAGNOSIS — D473 Essential (hemorrhagic) thrombocythemia: Secondary | ICD-10-CM | POA: Insufficient documentation

## 2018-01-03 DIAGNOSIS — J449 Chronic obstructive pulmonary disease, unspecified: Secondary | ICD-10-CM

## 2018-01-03 DIAGNOSIS — R42 Dizziness and giddiness: Secondary | ICD-10-CM | POA: Insufficient documentation

## 2018-01-03 DIAGNOSIS — I10 Essential (primary) hypertension: Secondary | ICD-10-CM

## 2018-01-03 DIAGNOSIS — Z8546 Personal history of malignant neoplasm of prostate: Secondary | ICD-10-CM | POA: Diagnosis not present

## 2018-01-03 DIAGNOSIS — R05 Cough: Secondary | ICD-10-CM | POA: Insufficient documentation

## 2018-01-03 DIAGNOSIS — D509 Iron deficiency anemia, unspecified: Secondary | ICD-10-CM

## 2018-01-03 DIAGNOSIS — Z7982 Long term (current) use of aspirin: Secondary | ICD-10-CM

## 2018-01-03 DIAGNOSIS — D472 Monoclonal gammopathy: Secondary | ICD-10-CM | POA: Diagnosis not present

## 2018-01-03 DIAGNOSIS — Z79899 Other long term (current) drug therapy: Secondary | ICD-10-CM | POA: Diagnosis not present

## 2018-01-03 DIAGNOSIS — K219 Gastro-esophageal reflux disease without esophagitis: Secondary | ICD-10-CM

## 2018-01-03 DIAGNOSIS — R059 Cough, unspecified: Secondary | ICD-10-CM

## 2018-01-03 DIAGNOSIS — D649 Anemia, unspecified: Secondary | ICD-10-CM

## 2018-01-03 DIAGNOSIS — E785 Hyperlipidemia, unspecified: Secondary | ICD-10-CM | POA: Insufficient documentation

## 2018-01-03 DIAGNOSIS — E039 Hypothyroidism, unspecified: Secondary | ICD-10-CM | POA: Insufficient documentation

## 2018-01-03 LAB — IRON AND TIBC
Iron: 339 ug/dL — ABNORMAL HIGH (ref 42–163)
Saturation Ratios: 96 % (ref 42–163)
TIBC: 354 ug/dL (ref 202–409)
UIBC: 15 ug/dL

## 2018-01-03 LAB — CBC WITH DIFFERENTIAL/PLATELET
BASOS ABS: 0.1 10*3/uL (ref 0.0–0.1)
BASOS PCT: 2 %
Eosinophils Absolute: 0.2 10*3/uL (ref 0.0–0.5)
Eosinophils Relative: 4 %
HEMATOCRIT: 35.5 % — AB (ref 38.4–49.9)
HEMOGLOBIN: 11.1 g/dL — AB (ref 13.0–17.1)
Lymphocytes Relative: 24 %
Lymphs Abs: 1.2 10*3/uL (ref 0.9–3.3)
MCH: 25.9 pg — ABNORMAL LOW (ref 27.2–33.4)
MCHC: 31.2 g/dL — ABNORMAL LOW (ref 32.0–36.0)
MCV: 83 fL (ref 79.3–98.0)
MONOS PCT: 7 %
Monocytes Absolute: 0.4 10*3/uL (ref 0.1–0.9)
NEUTROS ABS: 3.1 10*3/uL (ref 1.5–6.5)
NEUTROS PCT: 63 %
Platelets: 290 10*3/uL (ref 140–400)
RBC: 4.28 MIL/uL (ref 4.20–5.82)
RDW: 20.5 % — ABNORMAL HIGH (ref 11.0–14.6)
WBC: 4.9 10*3/uL (ref 4.0–10.3)

## 2018-01-03 LAB — FERRITIN: FERRITIN: 44 ng/mL (ref 24–336)

## 2018-01-03 NOTE — Telephone Encounter (Signed)
Printed avs and calender of upcoming appointment. Per 8/5 los 

## 2018-01-05 ENCOUNTER — Other Ambulatory Visit: Payer: Medicare PPO

## 2018-01-05 ENCOUNTER — Ambulatory Visit: Payer: Medicare PPO | Admitting: Hematology

## 2018-01-05 DIAGNOSIS — E039 Hypothyroidism, unspecified: Secondary | ICD-10-CM | POA: Diagnosis not present

## 2018-01-05 DIAGNOSIS — K219 Gastro-esophageal reflux disease without esophagitis: Secondary | ICD-10-CM | POA: Diagnosis not present

## 2018-01-05 DIAGNOSIS — D649 Anemia, unspecified: Secondary | ICD-10-CM | POA: Diagnosis not present

## 2018-01-05 DIAGNOSIS — J440 Chronic obstructive pulmonary disease with acute lower respiratory infection: Secondary | ICD-10-CM | POA: Diagnosis not present

## 2018-01-05 DIAGNOSIS — D473 Essential (hemorrhagic) thrombocythemia: Secondary | ICD-10-CM | POA: Diagnosis not present

## 2018-01-05 DIAGNOSIS — C61 Malignant neoplasm of prostate: Secondary | ICD-10-CM | POA: Diagnosis not present

## 2018-01-05 DIAGNOSIS — I10 Essential (primary) hypertension: Secondary | ICD-10-CM | POA: Diagnosis not present

## 2018-01-07 ENCOUNTER — Other Ambulatory Visit (HOSPITAL_COMMUNITY): Payer: Self-pay | Admitting: Respiratory Therapy

## 2018-01-07 DIAGNOSIS — J441 Chronic obstructive pulmonary disease with (acute) exacerbation: Secondary | ICD-10-CM

## 2018-01-10 ENCOUNTER — Ambulatory Visit (HOSPITAL_COMMUNITY)
Admission: RE | Admit: 2018-01-10 | Discharge: 2018-01-10 | Disposition: A | Discharge status: Home / Self Care | Payer: Medicare PPO | Source: Ambulatory Visit | Attending: Internal Medicine | Admitting: Internal Medicine

## 2018-01-10 DIAGNOSIS — J441 Chronic obstructive pulmonary disease with (acute) exacerbation: Secondary | ICD-10-CM | POA: Insufficient documentation

## 2018-01-10 LAB — PULMONARY FUNCTION TEST
DL/VA % PRED: 55 %
DL/VA: 2.49 ml/min/mmHg/L
DLCO unc % pred: 54 %
DLCO unc: 16.87 ml/min/mmHg
FEF 25-75 PRE: 1.67 L/s
FEF2575-%PRED-PRE: 88 %
FEV1-%Pred-Pre: 131 %
FEV1-PRE: 3.29 L
FEV1FVC-%Pred-Pre: 91 %
FEV6-%Pred-Pre: 136 %
FEV6-Pre: 4.44 L
FEV6FVC-%Pred-Pre: 97 %
FVC-%PRED-PRE: 140 %
FVC-PRE: 4.85 L
Pre FEV1/FVC ratio: 68 %
Pre FEV6/FVC Ratio: 92 %
RV % pred: 107 %
RV: 2.84 L
TLC % pred: 107 %
TLC: 7.38 L

## 2018-01-17 DIAGNOSIS — R3912 Poor urinary stream: Secondary | ICD-10-CM | POA: Diagnosis not present

## 2018-01-17 DIAGNOSIS — N401 Enlarged prostate with lower urinary tract symptoms: Secondary | ICD-10-CM | POA: Diagnosis not present

## 2018-01-19 DIAGNOSIS — N401 Enlarged prostate with lower urinary tract symptoms: Secondary | ICD-10-CM | POA: Diagnosis not present

## 2018-01-19 DIAGNOSIS — R3912 Poor urinary stream: Secondary | ICD-10-CM | POA: Diagnosis not present

## 2018-01-21 ENCOUNTER — Other Ambulatory Visit: Payer: Medicare PPO

## 2018-01-21 ENCOUNTER — Ambulatory Visit: Payer: Medicare PPO | Admitting: Nurse Practitioner

## 2018-01-24 ENCOUNTER — Ambulatory Visit (HOSPITAL_COMMUNITY)
Admission: RE | Admit: 2018-01-24 | Discharge: 2018-01-24 | Disposition: A | Discharge status: Home / Self Care | Payer: Medicare PPO | Source: Ambulatory Visit | Attending: Nurse Practitioner | Admitting: Nurse Practitioner

## 2018-01-24 DIAGNOSIS — M4692 Unspecified inflammatory spondylopathy, cervical region: Secondary | ICD-10-CM | POA: Insufficient documentation

## 2018-01-24 DIAGNOSIS — M47894 Other spondylosis, thoracic region: Secondary | ICD-10-CM | POA: Diagnosis not present

## 2018-01-24 DIAGNOSIS — D472 Monoclonal gammopathy: Secondary | ICD-10-CM | POA: Insufficient documentation

## 2018-01-24 DIAGNOSIS — M5136 Other intervertebral disc degeneration, lumbar region: Secondary | ICD-10-CM | POA: Diagnosis not present

## 2018-01-24 DIAGNOSIS — M47892 Other spondylosis, cervical region: Secondary | ICD-10-CM | POA: Diagnosis not present

## 2018-02-07 ENCOUNTER — Inpatient Hospital Stay: Payer: Medicare PPO | Admitting: Nurse Practitioner

## 2018-02-07 ENCOUNTER — Telehealth: Payer: Self-pay | Admitting: Nurse Practitioner

## 2018-02-07 ENCOUNTER — Inpatient Hospital Stay: Payer: Medicare PPO

## 2018-02-07 DIAGNOSIS — D509 Iron deficiency anemia, unspecified: Secondary | ICD-10-CM | POA: Diagnosis not present

## 2018-02-07 NOTE — Telephone Encounter (Signed)
Patient called to reschedule also said that he has been trying since wednesday

## 2018-02-07 NOTE — Progress Notes (Deleted)
[place]  [phone]) [phone] Fax:(336) [phone]  Clinic Follow up Note   Patient Care Team: Wenda Low, MD as PCP - General (Internal Medicine) Nobie Putnam, MD (Hematology and Oncology) Garlan Fair, MD as Attending Physician (Gastroenterology) Rana Snare, MD as Attending Physician (Urology) 02/07/2018  CHIEF COMPLAINT Follow up on essential thrombocytosis  PREVIOUS THERAPY:Hydrea 500 mg daily, from 08/2012 to 04/01/2015, stopped on 04/02/2015 due to recurrent episodes of dizziness, restarted on 06/08/2015. He tried anagrelide 0.5mg  bid for one month in 05/2015 which was not effective. Startled Hydrea 500 mg Monday-Saturday, hold on sundays due to anemia on 07/19/17, and decreased to 500mg  daily except Wednesday and Sunday on 12/06/2017  CURRENT THERAPY: hydrea 500mg  daily except Wednesday and Sunday on 12/06/2017  INTERVAL HISTORY: Please see below for problem oriented charting.  REVIEW OF SYSTEMS:   Constitutional: Denies fevers, chills or abnormal weight loss Eyes: Denies blurriness of vision Ears, nose, mouth, throat, and face: Denies mucositis or sore throat Respiratory: Denies cough, dyspnea or wheezes Cardiovascular: Denies palpitation, chest discomfort or lower extremity swelling Gastrointestinal:  Denies nausea, heartburn or change in bowel habits Skin: Denies abnormal skin rashes Lymphatics: Denies new lymphadenopathy or easy bruising Neurological:Denies numbness, tingling or new weaknesses Behavioral/Psych: Mood is stable, no new changes  All other systems were reviewed with the patient and are negative.  MEDICAL HISTORY:  Past Medical History:  Diagnosis Date  . Back pain   . Essential thrombocytosis ([place]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   . Essential thrombocytosis ([place]) 08/26/2012  . GERD (gastroesophageal reflux disease)   . Hernia   . Hyperlipidemia   . Hypertension   . Hypothyroid   . Prostate  cancer [facility])     SURGICAL HISTORY: Past Surgical History:  Procedure Laterality Date  . BACK SURGERY    . COLONOSCOPY  2003  . COLONOSCOPY N/A 01/17/2013   Procedure: COLONOSCOPY;  Surgeon: Garlan Fair, MD;  Location: [facility];  Service: Endoscopy;  Laterality: N/A;  . exp lap for trauma    . HERNIA REPAIR    . INCISION AND DRAINAGE ABSCESS ANAL  08/01/05  . PROSTATE SURGERY    . SHOULDER SURGERY     rt    I have reviewed the social history and family history with the patient and they are unchanged from previous note.  ALLERGIES:  has No Known Allergies.  MEDICATIONS:  Current Outpatient Medications  Medication Sig Dispense Refill  . aspirin 81 MG tablet Take 81 mg by mouth daily.      Marland Kitchen atorvastatin (LIPITOR) 40 MG tablet Take 40 mg by mouth daily.      Marland Kitchen diltiazem (CARDIZEM CD) 240 MG 24 hr capsule Take 240 mg by mouth daily.      . finasteride (PROSCAR) 5 MG tablet Take 5 mg by mouth daily.      . hydrochlorothiazide 25 MG tablet Take 25 mg by mouth daily.      . hydroxyurea (HYDREA) 500 MG capsule Take 1 capsule (500 mg total) by mouth daily. Take 1 cap daily except Wednesdays and Sundays 30 capsule 0  . levothyroxine (SYNTHROID, LEVOTHROID) 25 MCG tablet Take 25 mcg by mouth daily.      Marland Kitchen lisinopril (PRINIVIL,ZESTRIL) 20 MG tablet Take 20 mg by mouth daily.  6  . meclizine (ANTIVERT) 25 MG tablet Take 25 mg by mouth as needed. Reported on 11/11/2015  3  . mirtazapine (REMERON) 15 MG tablet Take 15 mg  by mouth at bedtime.     Marland Kitchen oxybutynin (DITROPAN) 5 MG tablet Take 5 mg by mouth 2 (two) times daily.  6  . pantoprazole (PROTONIX) 40 MG tablet Take 40 mg by mouth daily.      . psyllium (METAMUCIL) 58.6 % powder Take 1 packet by mouth 3 (three) times daily. Reported on 11/11/2015    . Tamsulosin HCl (FLOMAX) 0.4 MG CAPS Take 0.4 mg by mouth daily.     Marland Kitchen tiZANidine (ZANAFLEX) 4 MG tablet Take 4 mg by mouth daily. Reported on 11/11/2015     No current  facility-administered medications for this visit.     PHYSICAL EXAMINATION: ECOG PERFORMANCE STATUS: {CHL ONC ECOG PS:[phone]}  There were no vitals filed for this visit. There were no vitals filed for this visit.  GENERAL:alert, no distress and comfortable SKIN: skin color, texture, turgor are normal, no rashes or significant lesions EYES: normal, Conjunctiva are pink and non-injected, sclera clear OROPHARYNX:no exudate, no erythema and lips, buccal mucosa, and tongue normal  NECK: supple, thyroid normal size, non-tender, without nodularity LYMPH:  no palpable lymphadenopathy in the cervical, axillary or inguinal LUNGS: clear to auscultation and percussion with normal breathing effort HEART: regular rate & rhythm and no murmurs and no lower extremity edema ABDOMEN:abdomen soft, non-tender and normal bowel sounds Musculoskeletal:no cyanosis of digits and no clubbing  NEURO: alert & oriented x 3 with fluent speech, no focal motor/sensory deficits  LABORATORY DATA:  I have reviewed the data as listed CBC Latest Ref Rng & Units 01/03/2018 12/06/2017 12/06/2017  WBC 4.0 - 10.3 K/uL 4.9 4.8 -  Hemoglobin 13.0 - 17.1 g/dL 11.1(L) 9.9(L) -  Hematocrit 38.4 - 49.9 % 35.5(L) 31.9(L) 32.0(L)  Platelets 140 - 400 K/uL 290 242 -     CMP Latest Ref Rng & Units 10/11/2017 04/19/2017 09/14/2016  Glucose 70 - 140 mg/dL 100 99 123  BUN 7 - 26 mg/dL 15 12.5 14.3  Creatinine 0.70 - 1.30 mg/dL 1.34(H) 1.1 1.1  Sodium 136 - 145 mmol/L 140 140 142  Potassium 3.5 - 5.1 mmol/L 4.2 3.6 4.2  Chloride 98 - 109 mmol/L 107 - -  CO2 22 - 29 mmol/L 25 24 26   Calcium 8.4 - 10.4 mg/dL 9.3 9.0 10.1  Total Protein 6.4 - 8.3 g/dL 7.1 7.1 7.6  Total Bilirubin 0.2 - 1.2 mg/dL 0.5 0.54 0.50  Alkaline Phos 40 - 150 U/L 56 53 64  AST 5 - 34 U/L 18 17 19   ALT 0 - 55 U/L 8 9 9    MM panel  12/06/2017 M protein is 0.2  IgG 12/06/2017 1344, immunofixation shows IgG monoclonal protein with lambda light chain  specificity   RADIOGRAPHIC STUDIES: I have personally reviewed the radiological images as listed and agreed with the findings in the report. No results found.   ASSESSMENT & PLAN: ISMAR YABUT y.o.malewith   1. Essential Thrombocytosis. JAK2(+) 2. MGUS 3. Intermittent dizziness  4. Anemia 5. Cough with exertional dyspnea    PLAN:  No problem-specific Assessment & Plan notes found for this encounter.   No orders of the defined types were placed in this encounter.  All questions were answered. The patient knows to call the clinic with any problems, questions or concerns. No barriers to learning was detected. I spent {CHL ONC TIME VISIT - TDVVO:[id number]} counseling the patient face to face. The total time spent in the appointment was {CHL ONC TIME VISIT - IRSWN:[id number]} and more than 50% was on counseling  and review of test results     Alla Feeling, NP 02/07/18

## 2018-02-13 NOTE — Progress Notes (Signed)
[place]  [phone] [phone]  Clinic Follow up Note   Patient Care Team: Ricky Low, MD as PCP - General (Internal Medicine) Ricky Putnam, MD (Hematology and Oncology) Ricky Fair, MD as Attending Physician (Gastroenterology) Ricky Snare, MD as Attending Physician (Urology) 02/14/2018  CHIEF COMPLAINT Follow up on essential thrombocytosis  PREVIOUS THERAPY:Hydrea 500 mg daily, from 08/2012 to 04/01/2015, stopped on 04/02/2015 due to recurrent episodes of dizziness, restarted on 06/08/2015. He tried anagrelide 0.5mg  bid for one month in 05/2015 which was not effective. Startled Hydrea 500 mg Monday-Saturday, hold on sundays due to anemia on 07/19/17, and decreased to 500 mg daily except Wednesday and Sunday on 12/06/2017. Increased back to 500 mg daily except sundays on 9/16 due to increased plt count  CURRENT THERAPY: hydrea 500mg  daily except Sunday on 12/06/2017  INTERVAL HISTORY: Ricky Santos returns for lab and f/u as scheduled. Has been compliant with hydrea 500 mg daily except wed and sun. He underwent bone survey last month. He completed PFTs per pulm that he reports were normal. Cough is nearly gone, still with thick white sputum but much improved. Denies fever, chills, chest pain, or dyspnea. Appetite is normal. Manages chronic constipation with metamucil. Denies blood in stool. He is scheduled for colonoscopy per Ricky Santos on 10/28. Takes iron 1 tab daily.    MEDICAL HISTORY:  Past Medical History:  Diagnosis Date  . Back pain   . Essential thrombocytosis ([place]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   . Essential thrombocytosis ([place]) 08/26/2012  . GERD (gastroesophageal reflux disease)   . Hernia   . Hyperlipidemia   . Hypertension   . Hypothyroid   . Prostate cancer [facility])     SURGICAL HISTORY: Past Surgical History:  Procedure Laterality Date  . BACK SURGERY    . COLONOSCOPY  2003  .  COLONOSCOPY N/A 01/17/2013   Procedure: COLONOSCOPY;  Surgeon: Ricky Fair, MD;  Location: [facility];  Service: Endoscopy;  Laterality: N/A;  . exp lap for trauma    . HERNIA REPAIR    . INCISION AND DRAINAGE ABSCESS ANAL  08/01/05  . PROSTATE SURGERY    . SHOULDER SURGERY     rt    I have reviewed the social history and family history with the patient and they are unchanged from previous note.  ALLERGIES:  has No Known Allergies.  MEDICATIONS:  Current Outpatient Medications  Medication Sig Dispense Refill  . aspirin 81 MG tablet Take 81 mg by mouth daily.      Marland Kitchen atorvastatin (LIPITOR) 40 MG tablet Take 40 mg by mouth daily.      Marland Kitchen diltiazem (CARDIZEM CD) 240 MG 24 hr capsule Take 240 mg by mouth daily.      . finasteride (PROSCAR) 5 MG tablet Take 5 mg by mouth daily.      . hydrochlorothiazide 25 MG tablet Take 25 mg by mouth daily.      . hydroxyurea (HYDREA) 500 MG capsule Take 1 capsule (500 mg total) by mouth daily. Take 1 cap daily except Sundays 30 capsule 1  . levothyroxine (SYNTHROID, LEVOTHROID) 25 MCG tablet Take 25 mcg by mouth daily.      Marland Kitchen lisinopril (PRINIVIL,ZESTRIL) 20 MG tablet Take 20 mg by mouth daily.  6  . meclizine (ANTIVERT) 25 MG tablet Take 25 mg by mouth as needed. Reported on 11/11/2015  3  . mirtazapine (REMERON) 15 MG tablet Take 15 mg by mouth  at bedtime.     Marland Kitchen oxybutynin (DITROPAN) 5 MG tablet Take 5 mg by mouth 2 (two) times daily.  6  . pantoprazole (PROTONIX) 40 MG tablet Take 40 mg by mouth daily.      . psyllium (METAMUCIL) 58.6 % powder Take 1 packet by mouth 3 (three) times daily. Reported on 11/11/2015    . Tamsulosin HCl (FLOMAX) 0.4 MG CAPS Take 0.4 mg by mouth daily.     Marland Kitchen tiZANidine (ZANAFLEX) 4 MG tablet Take 4 mg by mouth daily. Reported on 11/11/2015     No current facility-administered medications for this visit.     PHYSICAL EXAMINATION: ECOG PERFORMANCE STATUS: 0 - Asymptomatic  Vitals:   02/14/18 1030  BP: (!) 145/80    Pulse: 79  Resp: 18  Temp: 98.5 F (36.9 C)  SpO2: 100%   Filed Weights   02/14/18 1030  Weight: 148 lb 14.4 oz (67.5 kg)    GENERAL:alert, no distress and comfortable SKIN: skin color, texture, turgor are normal, no rashes or significant lesions EYES: sclera clear OROPHARYNX:no thrush or ulcers  LYMPH:  no palpable cervical or supraclavicular lymphadenopathy  LUNGS: clear to auscultation with normal breathing effort HEART: regular rate & rhythm and no murmurs and no lower extremity edema ABDOMEN:abdomen soft, non-tender and normal bowel sounds Musculoskeletal:no cyanosis of digits and no clubbing  NEURO: alert & oriented x 3 with fluent speech, no focal motor/sensory deficits  LABORATORY DATA:  I have reviewed the data as listed CBC Latest Ref Rng & Units 02/14/2018 01/03/2018 12/06/2017  WBC 4.0 - 10.3 K/uL 5.6 4.9 4.8  Hemoglobin 13.0 - 17.1 g/dL 12.7(L) 11.1(L) 9.9(L)  Hematocrit 38.4 - 49.9 % 39.9 35.5(L) 31.9(L)  Platelets 140 - 400 K/uL 561(H) 290 242     CMP Latest Ref Rng & Units 02/14/2018 10/11/2017 04/19/2017  Glucose 70 - 99 mg/dL 96 100 99  BUN 8 - 23 mg/dL 13 15 12.5  Creatinine 0.61 - 1.24 mg/dL 1.16 1.34(H) 1.1  Sodium 135 - 145 mmol/L 139 140 140  Potassium 3.5 - 5.1 mmol/L 4.2 4.2 3.6  Chloride 98 - 111 mmol/L 106 107 -  CO2 22 - 32 mmol/L 24 25 24   Calcium 8.9 - 10.3 mg/dL 10.4(H) 9.3 9.0  Total Protein 6.5 - 8.1 g/dL 7.8 7.1 7.1  Total Bilirubin 0.3 - 1.2 mg/dL 0.5 0.5 0.54  Alkaline Phos 38 - 126 U/L 71 56 53  AST 15 - 41 U/L 20 18 17   ALT 0 - 44 U/L 8 8 9    MM panel  12/06/2017 M protein is 0.2 02/14/18 PENDING   IgG 12/06/2017 1344, immunofixation shows IgG monoclonal protein with lambda light chain specificity 02/14/18 PENDING    RADIOGRAPHIC STUDIES: I have personally reviewed the radiological images as listed and agreed with the findings in the report. No results found.   ASSESSMENT & PLAN: Ricky Santos y.o.malewith   1. Essential  Thrombocytosis, JAK2(+) -Ricky Santos appears stable. He is compliant with hydrea 500 mg daily except Wednesday and Sunday and is tolerating well. Due to increased plt to 561, I increased hydrea to 500 mg daily except sundays. I refilled Rx today -Anemia is improved, Hgb 12.7.  -will monitor closely, he will return for labs in 1 month and f/u in 2 months.    2. MGUS -M spike 0.2 with IgG monoclonal protein with lambda light chain specificity on 12/06/17. Dr. Burr Medico discussed findings with the patient.  -Cr on 10/11/17 was elevated to 1.34,  normal today -bone survey is negative for lytic lesions  -MM panel pending from today, will f/u   3. Intermittent dizziness  -Resolved   4. Anemia -His anemia improved after 1 month on oral iron therapy BID. Etiology of iron deficiency remains unclear. He had colonoscopy in 2014 per Dr. Earle Gell which the patient reports was normal.  -Hgb is improved, 12.7 today; iron studies are in Santos-normal range. He continues 1 tab daily  -he has colonoscopy with Ricky Santos scheduled ~10/28  5. Cough with exertional dyspnea  -Chest xray today does not show evidence of pneumonia. He does have COPD -He had f/u appt and PFTs were reportedly normal -cough is nearly resolved   PLAN:  -Labs reviewed, increase hydrea 500 mg to daily except sundays due to increased plt to 561K; new Rx sent  -patient received flu vac previously, reviewed infection precautions -Colonoscopy per Ricky Santos pending 10/28 -continue iron 1 tab daily  -monthly lab and f/u in 2 months   All questions were answered. The patient knows to call the clinic with any problems, questions or concerns. No barriers to learning was detected. I spent 20 minutes counseling the patient face to face. The total time spent in the appointment was 25 minutes and more than 50% was on counseling and review of test results     Alla Feeling, NP 02/14/18

## 2018-02-14 ENCOUNTER — Inpatient Hospital Stay: Payer: Medicare PPO | Attending: Hematology

## 2018-02-14 ENCOUNTER — Encounter: Payer: Self-pay | Admitting: Nurse Practitioner

## 2018-02-14 ENCOUNTER — Inpatient Hospital Stay (HOSPITAL_BASED_OUTPATIENT_CLINIC_OR_DEPARTMENT_OTHER): Payer: Medicare PPO | Admitting: Nurse Practitioner

## 2018-02-14 VITALS — BP 145/80 | HR 79 | Temp 98.5°F | Resp 18 | Ht 69.0 in | Wt 148.9 lb

## 2018-02-14 DIAGNOSIS — R05 Cough: Secondary | ICD-10-CM

## 2018-02-14 DIAGNOSIS — Z7982 Long term (current) use of aspirin: Secondary | ICD-10-CM | POA: Insufficient documentation

## 2018-02-14 DIAGNOSIS — I10 Essential (primary) hypertension: Secondary | ICD-10-CM | POA: Diagnosis not present

## 2018-02-14 DIAGNOSIS — K219 Gastro-esophageal reflux disease without esophagitis: Secondary | ICD-10-CM | POA: Insufficient documentation

## 2018-02-14 DIAGNOSIS — E785 Hyperlipidemia, unspecified: Secondary | ICD-10-CM | POA: Diagnosis not present

## 2018-02-14 DIAGNOSIS — Z8546 Personal history of malignant neoplasm of prostate: Secondary | ICD-10-CM | POA: Insufficient documentation

## 2018-02-14 DIAGNOSIS — D472 Monoclonal gammopathy: Secondary | ICD-10-CM

## 2018-02-14 DIAGNOSIS — E039 Hypothyroidism, unspecified: Secondary | ICD-10-CM | POA: Insufficient documentation

## 2018-02-14 DIAGNOSIS — K5909 Other constipation: Secondary | ICD-10-CM | POA: Diagnosis not present

## 2018-02-14 DIAGNOSIS — Z79899 Other long term (current) drug therapy: Secondary | ICD-10-CM | POA: Diagnosis not present

## 2018-02-14 DIAGNOSIS — J449 Chronic obstructive pulmonary disease, unspecified: Secondary | ICD-10-CM | POA: Diagnosis not present

## 2018-02-14 DIAGNOSIS — D473 Essential (hemorrhagic) thrombocythemia: Secondary | ICD-10-CM | POA: Diagnosis not present

## 2018-02-14 DIAGNOSIS — D509 Iron deficiency anemia, unspecified: Secondary | ICD-10-CM

## 2018-02-14 LAB — CBC WITH DIFFERENTIAL/PLATELET
Basophils Absolute: 0.1 10*3/uL (ref 0.0–0.1)
Basophils Relative: 2 %
Eosinophils Absolute: 0.3 10*3/uL (ref 0.0–0.5)
Eosinophils Relative: 5 %
HEMATOCRIT: 39.9 % (ref 38.4–49.9)
HEMOGLOBIN: 12.7 g/dL — AB (ref 13.0–17.1)
LYMPHS ABS: 1.5 10*3/uL (ref 0.9–3.3)
LYMPHS PCT: 27 %
MCH: 28.5 pg (ref 27.2–33.4)
MCHC: 31.8 g/dL — ABNORMAL LOW (ref 32.0–36.0)
MCV: 89.7 fL (ref 79.3–98.0)
Monocytes Absolute: 0.4 10*3/uL (ref 0.1–0.9)
Monocytes Relative: 7 %
NEUTROS PCT: 59 %
Neutro Abs: 3.3 10*3/uL (ref 1.5–6.5)
Platelets: 561 10*3/uL — ABNORMAL HIGH (ref 140–400)
RBC: 4.45 MIL/uL (ref 4.20–5.82)
RDW: 20.8 % — ABNORMAL HIGH (ref 11.0–14.6)
WBC: 5.6 10*3/uL (ref 4.0–10.3)

## 2018-02-14 LAB — CMP (CANCER CENTER ONLY)
ALBUMIN: 4.1 g/dL (ref 3.5–5.0)
ALK PHOS: 71 U/L (ref 38–126)
ALT: 8 U/L (ref 0–44)
ANION GAP: 9 (ref 5–15)
AST: 20 U/L (ref 15–41)
BILIRUBIN TOTAL: 0.5 mg/dL (ref 0.3–1.2)
BUN: 13 mg/dL (ref 8–23)
CALCIUM: 10.4 mg/dL — AB (ref 8.9–10.3)
CO2: 24 mmol/L (ref 22–32)
Chloride: 106 mmol/L (ref 98–111)
Creatinine: 1.16 mg/dL (ref 0.61–1.24)
GFR, Estimated: 57 mL/min — ABNORMAL LOW (ref >60)
GLUCOSE: 96 mg/dL (ref 70–99)
Potassium: 4.2 mmol/L (ref 3.5–5.1)
Sodium: 139 mmol/L (ref 135–145)
TOTAL PROTEIN: 7.8 g/dL (ref 6.5–8.1)

## 2018-02-14 LAB — IRON AND TIBC
IRON: 320 ug/dL — AB (ref 42–163)
Saturation Ratios: 95 % (ref 42–163)
TIBC: 336 ug/dL (ref 202–409)
UIBC: 16 ug/dL

## 2018-02-14 LAB — FERRITIN: Ferritin: 34 ng/mL (ref 24–336)

## 2018-02-14 MED ORDER — HYDROXYUREA 500 MG PO CAPS: 500.0000 mg | ORAL_CAPSULE | Freq: Every day | ORAL | 1 refills | Status: DC

## 2018-02-15 ENCOUNTER — Telehealth: Payer: Self-pay | Admitting: Hematology

## 2018-02-15 LAB — MULTIPLE MYELOMA PANEL, SERUM
Albumin SerPl Elph-Mcnc: 3.9 g/dL (ref 2.9–4.4)
Albumin/Glob SerPl: 1.3 (ref 0.7–1.7)
Alpha 1: 0.3 g/dL (ref 0.0–0.4)
Alpha2 Glob SerPl Elph-Mcnc: 0.8 g/dL (ref 0.4–1.0)
B-Globulin SerPl Elph-Mcnc: 0.9 g/dL (ref 0.7–1.3)
Gamma Glob SerPl Elph-Mcnc: 1.2 g/dL (ref 0.4–1.8)
Globulin, Total: 3.2 g/dL (ref 2.2–3.9)
IGM (IMMUNOGLOBULIN M), SRM: 79 mg/dL (ref 15–143)
IgA: 137 mg/dL (ref 61–437)
IgG (Immunoglobin G), Serum: 1171 mg/dL (ref 700–1600)
Total Protein ELP: 7.1 g/dL (ref 6.0–8.5)

## 2018-02-15 NOTE — Telephone Encounter (Signed)
No change in schedule  Per 9/16 los

## 2018-02-16 ENCOUNTER — Telehealth: Payer: Self-pay

## 2018-02-16 NOTE — Telephone Encounter (Signed)
Left voice message for patient with lab results.  Per Cira Rue NP M protein is not detected, good news.  Will continue to monitor, keep your scheduled appointments.

## 2018-02-16 NOTE — Telephone Encounter (Signed)
-----   Message from Alla Feeling, NP sent at 02/15/2018  4:22 PM EDT ----- Please let him know M protein is not detected, this is good. Will continue to monitor. Keep same appointments as we discussed yesterday at f/u.  Thanks, Regan Rakers NP

## 2018-02-28 DIAGNOSIS — E039 Hypothyroidism, unspecified: Secondary | ICD-10-CM | POA: Diagnosis not present

## 2018-02-28 DIAGNOSIS — N400 Enlarged prostate without lower urinary tract symptoms: Secondary | ICD-10-CM | POA: Diagnosis not present

## 2018-02-28 DIAGNOSIS — Z6824 Body mass index (BMI) 24.0-24.9, adult: Secondary | ICD-10-CM | POA: Diagnosis not present

## 2018-02-28 DIAGNOSIS — I10 Essential (primary) hypertension: Secondary | ICD-10-CM | POA: Diagnosis not present

## 2018-02-28 DIAGNOSIS — E785 Hyperlipidemia, unspecified: Secondary | ICD-10-CM | POA: Diagnosis not present

## 2018-03-07 ENCOUNTER — Inpatient Hospital Stay: Payer: Medicare PPO | Attending: Hematology

## 2018-03-07 DIAGNOSIS — N401 Enlarged prostate with lower urinary tract symptoms: Secondary | ICD-10-CM | POA: Diagnosis not present

## 2018-03-07 DIAGNOSIS — R3912 Poor urinary stream: Secondary | ICD-10-CM | POA: Diagnosis not present

## 2018-03-07 DIAGNOSIS — D473 Essential (hemorrhagic) thrombocythemia: Secondary | ICD-10-CM | POA: Diagnosis not present

## 2018-03-07 DIAGNOSIS — D472 Monoclonal gammopathy: Secondary | ICD-10-CM

## 2018-03-07 DIAGNOSIS — D509 Iron deficiency anemia, unspecified: Secondary | ICD-10-CM

## 2018-03-07 LAB — CBC WITH DIFFERENTIAL/PLATELET
BASOS PCT: 2 %
Basophils Absolute: 0.1 10*3/uL (ref 0.0–0.1)
Eosinophils Absolute: 0.2 10*3/uL (ref 0.0–0.5)
Eosinophils Relative: 5 %
HEMATOCRIT: 44.1 % (ref 38.4–49.9)
Hemoglobin: 13.9 g/dL (ref 13.0–17.1)
LYMPHS ABS: 1.6 10*3/uL (ref 0.9–3.3)
LYMPHS PCT: 34 %
MCH: 29.9 pg (ref 27.2–33.4)
MCHC: 31.5 g/dL — AB (ref 32.0–36.0)
MCV: 94.8 fL (ref 79.3–98.0)
MONO ABS: 0.4 10*3/uL (ref 0.1–0.9)
MONOS PCT: 9 %
Neutro Abs: 2.4 10*3/uL (ref 1.5–6.5)
Neutrophils Relative %: 50 %
Platelets: 386 10*3/uL (ref 140–400)
RBC: 4.65 MIL/uL (ref 4.20–5.82)
RDW: 19.5 % — AB (ref 11.0–14.6)
WBC: 4.7 10*3/uL (ref 4.0–10.3)

## 2018-03-07 LAB — FERRITIN: Ferritin: 30 ng/mL (ref 24–336)

## 2018-03-07 LAB — CMP (CANCER CENTER ONLY)
ALK PHOS: 69 U/L (ref 38–126)
ALT: 10 U/L (ref 0–44)
ANION GAP: 11 (ref 5–15)
AST: 22 U/L (ref 15–41)
Albumin: 4.1 g/dL (ref 3.5–5.0)
BUN: 12 mg/dL (ref 8–23)
CALCIUM: 10 mg/dL (ref 8.9–10.3)
CO2: 23 mmol/L (ref 22–32)
Chloride: 106 mmol/L (ref 98–111)
Creatinine: 1.06 mg/dL (ref 0.61–1.24)
Glucose, Bld: 87 mg/dL (ref 70–99)
Potassium: 4.7 mmol/L (ref 3.5–5.1)
Sodium: 140 mmol/L (ref 135–145)
TOTAL PROTEIN: 7.6 g/dL (ref 6.5–8.1)
Total Bilirubin: 0.4 mg/dL (ref 0.3–1.2)

## 2018-03-07 LAB — IRON AND TIBC
IRON: 326 ug/dL — AB (ref 42–163)
Saturation Ratios: 97 % (ref 42–163)
TIBC: 336 ug/dL (ref 202–409)
UIBC: 10 ug/dL

## 2018-03-09 ENCOUNTER — Telehealth: Payer: Self-pay

## 2018-03-09 NOTE — Telephone Encounter (Signed)
Spoke with patient per Dr. Burr Medico notified him platelets are normal.  Continue current dose of hydra 500 mg except sundays.  Keep lab and f/u on 11/4.  Patient verbalized an understanding.

## 2018-03-09 NOTE — Telephone Encounter (Signed)
-----   Message from Alla Feeling, NP sent at 03/08/2018  1:31 PM EDT ----- Please let him know lab results. Platelets are normal. Continue current dose hydrea 500 mg daily except sundays. Keep lab and f/u on 11/4.  Thanks, Regan Rakers NP

## 2018-03-28 DIAGNOSIS — K635 Polyp of colon: Secondary | ICD-10-CM | POA: Diagnosis not present

## 2018-03-28 DIAGNOSIS — K449 Diaphragmatic hernia without obstruction or gangrene: Secondary | ICD-10-CM | POA: Diagnosis not present

## 2018-03-28 DIAGNOSIS — D509 Iron deficiency anemia, unspecified: Secondary | ICD-10-CM | POA: Diagnosis not present

## 2018-03-28 DIAGNOSIS — K621 Rectal polyp: Secondary | ICD-10-CM | POA: Diagnosis not present

## 2018-03-28 DIAGNOSIS — K640 First degree hemorrhoids: Secondary | ICD-10-CM | POA: Diagnosis not present

## 2018-03-28 DIAGNOSIS — K5730 Diverticulosis of large intestine without perforation or abscess without bleeding: Secondary | ICD-10-CM | POA: Diagnosis not present

## 2018-03-30 DIAGNOSIS — K635 Polyp of colon: Secondary | ICD-10-CM | POA: Diagnosis not present

## 2018-04-04 ENCOUNTER — Inpatient Hospital Stay (HOSPITAL_BASED_OUTPATIENT_CLINIC_OR_DEPARTMENT_OTHER): Payer: Medicare PPO | Admitting: Hematology

## 2018-04-04 ENCOUNTER — Telehealth: Payer: Self-pay | Admitting: Hematology

## 2018-04-04 ENCOUNTER — Encounter: Payer: Self-pay | Admitting: Hematology

## 2018-04-04 ENCOUNTER — Inpatient Hospital Stay: Payer: Medicare PPO | Attending: Hematology

## 2018-04-04 VITALS — BP 145/77 | HR 80 | Temp 97.9°F | Resp 26 | Ht 69.0 in | Wt 150.4 lb

## 2018-04-04 DIAGNOSIS — Z8546 Personal history of malignant neoplasm of prostate: Secondary | ICD-10-CM

## 2018-04-04 DIAGNOSIS — D509 Iron deficiency anemia, unspecified: Secondary | ICD-10-CM

## 2018-04-04 DIAGNOSIS — D472 Monoclonal gammopathy: Secondary | ICD-10-CM | POA: Insufficient documentation

## 2018-04-04 DIAGNOSIS — Z79899 Other long term (current) drug therapy: Secondary | ICD-10-CM | POA: Insufficient documentation

## 2018-04-04 DIAGNOSIS — Z7982 Long term (current) use of aspirin: Secondary | ICD-10-CM

## 2018-04-04 DIAGNOSIS — I10 Essential (primary) hypertension: Secondary | ICD-10-CM | POA: Insufficient documentation

## 2018-04-04 DIAGNOSIS — R42 Dizziness and giddiness: Secondary | ICD-10-CM | POA: Diagnosis not present

## 2018-04-04 DIAGNOSIS — D473 Essential (hemorrhagic) thrombocythemia: Secondary | ICD-10-CM

## 2018-04-04 LAB — CBC WITH DIFFERENTIAL/PLATELET
ABS IMMATURE GRANULOCYTES: 0.04 10*3/uL (ref 0.00–0.07)
Basophils Absolute: 0.1 10*3/uL (ref 0.0–0.1)
Basophils Relative: 2 %
Eosinophils Absolute: 0.3 10*3/uL (ref 0.0–0.5)
Eosinophils Relative: 4 %
HCT: 43.9 % (ref 39.0–52.0)
HEMOGLOBIN: 14.2 g/dL (ref 13.0–17.0)
IMMATURE GRANULOCYTES: 1 %
LYMPHS ABS: 1.9 10*3/uL (ref 0.7–4.0)
LYMPHS PCT: 28 %
MCH: 30.5 pg (ref 26.0–34.0)
MCHC: 32.3 g/dL (ref 30.0–36.0)
MCV: 94.4 fL (ref 80.0–100.0)
MONOS PCT: 8 %
Monocytes Absolute: 0.5 10*3/uL (ref 0.1–1.0)
NEUTROS ABS: 3.8 10*3/uL (ref 1.7–7.7)
NEUTROS PCT: 57 %
PLATELETS: 585 10*3/uL — AB (ref 150–400)
RBC: 4.65 MIL/uL (ref 4.22–5.81)
RDW: 16 % — ABNORMAL HIGH (ref 11.5–15.5)
WBC: 6.6 10*3/uL (ref 4.0–10.5)
nRBC: 0 % (ref 0.0–0.2)

## 2018-04-04 LAB — CMP (CANCER CENTER ONLY)
ALBUMIN: 4.2 g/dL (ref 3.5–5.0)
ALK PHOS: 64 U/L (ref 38–126)
ALT: 11 U/L (ref 0–44)
ANION GAP: 12 (ref 5–15)
AST: 20 U/L (ref 15–41)
BUN: 14 mg/dL (ref 8–23)
CALCIUM: 10.2 mg/dL (ref 8.9–10.3)
CO2: 23 mmol/L (ref 22–32)
Chloride: 105 mmol/L (ref 98–111)
Creatinine: 1.24 mg/dL (ref 0.61–1.24)
GFR, Estimated: 52 mL/min — ABNORMAL LOW (ref >60)
GLUCOSE: 78 mg/dL (ref 70–99)
POTASSIUM: 4 mmol/L (ref 3.5–5.1)
SODIUM: 140 mmol/L (ref 135–145)
TOTAL PROTEIN: 7.8 g/dL (ref 6.5–8.1)
Total Bilirubin: 0.6 mg/dL (ref 0.3–1.2)

## 2018-04-04 LAB — FERRITIN: Ferritin: 63 ng/mL (ref 24–336)

## 2018-04-04 LAB — IRON AND TIBC
Iron: 311 ug/dL — ABNORMAL HIGH (ref 42–163)
SATURATION RATIOS: 98 % — AB (ref 20–55)
TIBC: 318 ug/dL (ref 202–409)
UIBC: 6 ug/dL — AB (ref 117–376)

## 2018-04-04 NOTE — Telephone Encounter (Signed)
Appts scheduled avs declined / calendar printed per 11/4 los

## 2018-04-04 NOTE — Progress Notes (Signed)
[facility] OFFICE PROGRESS NOTE  Referring Physician: Wenda Low, MD [place] [address] [address]  CHIEF COMPLAINT Follow up on essential thrombocytosis  HISTORY OF PRESENT ILLNESS   PREVIOUS THERAPY: Hydrea 500 mg daily, from 08/2012 to 04/01/2015, stopped on 04/02/2015 due to recurrent episodes of dizziness, restarted on 06/08/2015. He tried anagrelide 0.'5mg'$  bid for one month in 05/2015 which was not effective. Startled Hydrea 500 mg Monday-Saturday, hold on sundays due to anemia on 07/19/17, and decreased to '500mg'$  daily except Wednesday and Sunday on 12/06/2017, increased back to '500mg'$  daily except on Sundays in 01/2018  CURRENT THERAPY:  hydrea '500mg'$  daily except Sundays  INTERVAL HISTORY:  Ricky Santos 84 y.o. male with a history of Essential Thrombocytosis with JAK-2 mutation positive is here for follow-up. He is here alone. He feels good and states that he still gets dizzy spells everyday. He is eating and drinking well. He denies LL edema.   MEDICAL HISTORY: Past Medical History:  Diagnosis Date  . Back pain   . Essential thrombocytosis ([place]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   . Essential thrombocytosis ([place]) 08/26/2012  . GERD (gastroesophageal reflux disease)   . Hernia   . Hyperlipidemia   . Hypertension   . Hypothyroid   . Prostate cancer [facility])     INTERIM HISTORY: has Essential thrombocytosis ([place]); Dizziness; Anemia; and Iron deficiency anemia on their problem list.    ALLERGIES:  has No Known Allergies.  MEDICATIONS:  Allergies as of 04/04/2018   No Known Allergies     Medication List        Accurate as of 04/04/18  9:54 PM. Always use your most recent med list.          aspirin 81 MG tablet Take 81 mg by mouth daily.   atorvastatin 40 MG tablet Commonly known as:  LIPITOR Take 40 mg by mouth daily.   diltiazem 240 MG 24 hr capsule Commonly known as:  CARDIZEM CD Take 240 mg by  mouth daily.   finasteride 5 MG tablet Commonly known as:  PROSCAR Take 5 mg by mouth daily.   hydrochlorothiazide 25 MG tablet Commonly known as:  HYDRODIURIL Take 25 mg by mouth daily.   hydroxyurea 500 MG capsule Commonly known as:  HYDREA Take 1 capsule (500 mg total) by mouth daily. Take 1 cap daily except Sundays   levothyroxine 25 MCG tablet Commonly known as:  SYNTHROID, LEVOTHROID Take 25 mcg by mouth daily.   lisinopril 20 MG tablet Commonly known as:  PRINIVIL,ZESTRIL Take 20 mg by mouth daily.   meclizine 25 MG tablet Commonly known as:  ANTIVERT Take 25 mg by mouth as needed. Reported on 11/11/2015   mirtazapine 15 MG tablet Commonly known as:  REMERON Take 15 mg by mouth at bedtime.   pantoprazole 40 MG tablet Commonly known as:  PROTONIX Take 40 mg by mouth daily.   psyllium 58.6 % powder Commonly known as:  METAMUCIL Take 1 packet by mouth 3 (three) times daily. Reported on 11/11/2015   tiZANidine 4 MG tablet Commonly known as:  ZANAFLEX Take 4 mg by mouth daily. Reported on 11/11/2015      SURGICAL HISTORY:  Past Surgical History:  Procedure Laterality Date  . BACK SURGERY    . COLONOSCOPY  2003  . COLONOSCOPY N/A 01/17/2013   Procedure: COLONOSCOPY;  Surgeon: Garlan Fair, MD;  [facility];  Service: Endoscopy;  Laterality: N/A;  .  exp lap for trauma    . HERNIA REPAIR    . INCISION AND DRAINAGE ABSCESS ANAL  08/01/05  . PROSTATE SURGERY    . SHOULDER SURGERY     rt    REVIEW OF SYSTEMS:   Constitutional: Denies fevers, chills or abnormal weight loss Eyes: Denies blurriness of vision Ears, nose, mouth, throat, and face: Denies mucositis or sore throat Respiratory: Denies dyspnea or wheezes  Cardiovascular: Denies palpitation, chest discomfort or lower extremity swelling Gastrointestinal:  Denies nausea, heartburn or change in bowel habits Skin: Denies abnormal skin rashes Lymphatics: Denies new lymphadenopathy or easy  bruising Neurological:Denies numbness, tingling or new weaknesses (+) dizziness  Behavioral/Psych: Mood is stable, no new changes  All other systems were reviewed with the patient and are negative.  PHYSICAL EXAMINATION: ECOG PERFORMANCE STATUS:1   Blood pressure (!) 145/77, pulse 80, temperature 97.9 F (36.6 C), temperature source Oral, resp. rate (!) 26, height '5\' 9"'$  (1.753 m), weight 150 lb 6.4 oz (68.2 kg), SpO2 100 %.   GENERAL:alert, no distress and comfortable SKIN: skin color, texture, turgor are normal, no rashes or significant lesions EYES: normal, Conjunctiva are pink and non-injected, sclera clear OROPHARYNX:no exudate, no erythema and lips, buccal mucosa, and tongue normal  NECK: supple, thyroid normal size, non-tender, without nodularity LYMPH:  no palpable lymphadenopathy in the cervical, axillary or supraclavicular LUNGS: clear to auscultation and percussion with normal breathing effort HEART: regular rate & rhythm and no murmurs and no lower extremity edema ABDOMEN:abdomen soft, non-tender and normal bowel sounds Musculoskeletal:no cyanosis of digits and no clubbing  NEURO: alert & oriented x 3 with fluent speech, no focal motor/sensory deficits  LABORATORY DATA: CBC Latest Ref Rng & Units 04/04/2018 03/07/2018 02/14/2018  WBC 4.0 - 10.5 K/uL 6.6 4.7 5.6  Hemoglobin 13.0 - 17.0 g/dL 14.2 13.9 12.7(L)  Hematocrit 39.0 - 52.0 % 43.9 44.1 39.9  Platelets 150 - 400 K/uL 585(H) 386 561(H)    CMP Latest Ref Rng & Units 04/04/2018 03/07/2018 02/14/2018  Glucose 70 - 99 mg/dL 78 87 96  BUN 8 - 23 mg/dL '14 12 13  '$ Creatinine 0.61 - 1.24 mg/dL 1.24 1.06 1.16  Sodium 135 - 145 mmol/L 140 140 139  Potassium 3.5 - 5.1 mmol/L 4.0 4.7 4.2  Chloride 98 - 111 mmol/L 105 106 106  CO2 22 - 32 mmol/L '23 23 24  '$ Calcium 8.9 - 10.3 mg/dL 10.2 10.0 10.4(H)  Total Protein 6.5 - 8.1 g/dL 7.8 7.6 7.8  Total Bilirubin 0.3 - 1.2 mg/dL 0.6 0.4 0.5  Alkaline Phos 38 - 126 U/L 64 69 71  AST 15  - 41 U/L '20 22 20  '$ ALT 0 - 44 U/L '11 10 8   '$ RADIOGRAPHIC STUDIES:  US Soft Tissue Head/Neck 07/20/2016 IMPRESSION: No thyroid nodule meets criteria for biopsy or surveillance, as designated by the newly established ACR TI-RADS criteria.  ASSESSMENT:  [name] 84 y.o. male with   1. Essential Thrombocytosis. JAK2(+) -He was diagnosed in March 2014, initial platelet count in the 500k range, JAK2 mutation (+), he did not have bone marrow biopsy.  -We previously discussed that this is a myeloproliferative disorder, people usually do very well, and there is small percentage pts will develop myelofibrosis and AML late on.  -We again reviewed the increased risks of thrombosis from ET, which is the main complication.  -Due to his intermittent dizziness, Hydrea was switched to anagrelide 0.'5mg'$  bid for one month but it did not control his  plt well, could be related to its low dose.  He was switched back to Hydrea  -Due to his anemia, I previously decreased his Hydrea to 500 mg daily except Wednesdays and Sundays -his anemia has resolved now  -Due to increased plt to 561, his hydrea increased to 500 mg daily except sundays starting 02/14/18. Repeated CBC on 03/07/2018 showed plt 386K -Labs reviewed, CBC  Platelets 585K. I advised him to continue Hydrea at current dose for now and we will repeat labs in 2-3 weeks. If he has persistent thrombocytosis then will ncrease hydrea to '500mg'$  daily - CMP is WNLs. Iron 311 Saturation ratio 98.  Ferritin and MM Panel pending.  -f/u in 2 months.   2. Anemia of iron deficiency  -He has developed mild anemia since 10/2016, to be related to long-standing Hydrea -I checked his iron study, B12, folate acid level, and SPEP which show mild iron deficiency. I recommended him to start OTC ferric sulfate 1 tab twice daily, watch for constipation.  -He also has low level abnormal M-protein, likely benign -His anemia improved after 1 month on oral iron therapy BID. Iron  studies normalized. Etiology of iron deficiency remains unclear. He had colonoscopy in 2014 per Dr. Earle Gell which the patient reports was normal.  -He had EGD and colonoscopy with Dr. Michail Sermon, I will request a copy  -Continue oral ferrous sulfate once daily   3. MGUS -M spike 0.2 with IgG monoclonal protein with lambda light chain specificity on 12/06/17. I previously discussed findings with the patient.  -Cr on 10/11/17 was elevated to 1.34, normal previously; has normalzed on 02/14/18 -01/24/18 bone survey is negative for lytic lesions  -will follow with monthly lab    4. Intermittent dizziness -unlikely related to hydrea -He previously participated a course of inner ear balance PT without any success  -he will follow-up with Dr. Lysle Rubens -resolved   Plan -Continue same current Hydrea dose at '500mg'$  daily except Sundays. -f/u in 2 months -Labs in 2-3 weeks. If he has persistent thrombocytosis, will increase Hydrea to '500mg'$  dialy -I will request Colonoscopy report from Dr. Michail Sermon.    All questions were answered. The patient knows to call the clinic with any problems, questions or concerns. We can certainly see the patient much sooner if necessary.  I spent 15 minutes counseling the patient face to face. The total time spent in the appointment was 20 minutes.  Dierdre Searles Dweik am acting as scribe for Dr. Truitt Merle.  I have reviewed the above documentation for accuracy and completeness, and I agree with the above.    Truitt Merle, MD 04/04/2018

## 2018-04-06 ENCOUNTER — Telehealth: Payer: Self-pay

## 2018-04-06 NOTE — Telephone Encounter (Signed)
Faxed request for patient's EGD and colonoscopy results to [facility]

## 2018-04-07 ENCOUNTER — Telehealth: Payer: Self-pay | Admitting: Hematology

## 2018-04-07 NOTE — Telephone Encounter (Signed)
I received patient's EGD and colonoscopy report from Dr. Michail Sermon, and call the patient and discussed with him.  He had 4 small polyps removed from rectum, colonoscopy also showed diverticulosis in the sigmoid colon, and internal hemorrhoid.  EGD showed hiatal hernia, otherwise negative.  No source of bleeding was identified.  I explained the above to the patient, he voiced good understanding, he knows to follow-up with Dr. Michail Sermon for repeat colonoscopy in 3 to 5 years.  Truitt Merle 04/07/2018

## 2018-04-08 LAB — MULTIPLE MYELOMA PANEL, SERUM
ALBUMIN SERPL ELPH-MCNC: 4 g/dL (ref 2.9–4.4)
Albumin/Glob SerPl: 1.3 (ref 0.7–1.7)
Alpha 1: 0.3 g/dL (ref 0.0–0.4)
Alpha2 Glob SerPl Elph-Mcnc: 0.8 g/dL (ref 0.4–1.0)
B-Globulin SerPl Elph-Mcnc: 0.9 g/dL (ref 0.7–1.3)
Gamma Glob SerPl Elph-Mcnc: 1.2 g/dL (ref 0.4–1.8)
Globulin, Total: 3.2 g/dL (ref 2.2–3.9)
IGA: 136 mg/dL (ref 61–437)
IGM (IMMUNOGLOBULIN M), SRM: 64 mg/dL (ref 15–143)
IgG (Immunoglobin G), Serum: 1189 mg/dL (ref 700–1600)
Total Protein ELP: 7.2 g/dL (ref 6.0–8.5)

## 2018-04-13 ENCOUNTER — Telehealth: Payer: Self-pay | Admitting: Emergency Medicine

## 2018-04-13 NOTE — Telephone Encounter (Addendum)
Phone call to patient. Patient identified himself on voicemail. Detailed message regarding this note left on his vm.   ----- Message from Alla Feeling, NP sent at 04/10/2018  9:19 PM EST ----- Please let him know M protein is not detectable, good result. No concerns. Will continue monitoring. Keep upcoming appointments.  Thanks, Regan Rakers, NP

## 2018-04-21 ENCOUNTER — Inpatient Hospital Stay: Payer: Medicare PPO

## 2018-04-21 DIAGNOSIS — Z8546 Personal history of malignant neoplasm of prostate: Secondary | ICD-10-CM | POA: Diagnosis not present

## 2018-04-21 DIAGNOSIS — Z7982 Long term (current) use of aspirin: Secondary | ICD-10-CM | POA: Diagnosis not present

## 2018-04-21 DIAGNOSIS — D473 Essential (hemorrhagic) thrombocythemia: Secondary | ICD-10-CM | POA: Diagnosis not present

## 2018-04-21 DIAGNOSIS — D472 Monoclonal gammopathy: Secondary | ICD-10-CM

## 2018-04-21 DIAGNOSIS — D509 Iron deficiency anemia, unspecified: Secondary | ICD-10-CM

## 2018-04-21 DIAGNOSIS — Z79899 Other long term (current) drug therapy: Secondary | ICD-10-CM | POA: Diagnosis not present

## 2018-04-21 DIAGNOSIS — I10 Essential (primary) hypertension: Secondary | ICD-10-CM | POA: Diagnosis not present

## 2018-04-21 DIAGNOSIS — R42 Dizziness and giddiness: Secondary | ICD-10-CM | POA: Diagnosis not present

## 2018-04-21 LAB — CMP (CANCER CENTER ONLY)
ALT: 7 U/L (ref 0–44)
ANION GAP: 10 (ref 5–15)
AST: 19 U/L (ref 15–41)
Albumin: 4 g/dL (ref 3.5–5.0)
Alkaline Phosphatase: 61 U/L (ref 38–126)
BUN: 15 mg/dL (ref 8–23)
CHLORIDE: 106 mmol/L (ref 98–111)
CO2: 24 mmol/L (ref 22–32)
Calcium: 9.5 mg/dL (ref 8.9–10.3)
Creatinine: 1.23 mg/dL (ref 0.61–1.24)
GFR, Estimated: 53 mL/min — ABNORMAL LOW (ref >60)
Glucose, Bld: 108 mg/dL — ABNORMAL HIGH (ref 70–99)
POTASSIUM: 4.1 mmol/L (ref 3.5–5.1)
SODIUM: 140 mmol/L (ref 135–145)
Total Bilirubin: 0.4 mg/dL (ref 0.3–1.2)
Total Protein: 7.4 g/dL (ref 6.5–8.1)

## 2018-04-21 LAB — CBC WITH DIFFERENTIAL/PLATELET
Abs Immature Granulocytes: 0.05 10*3/uL (ref 0.00–0.07)
BASOS ABS: 0.1 10*3/uL (ref 0.0–0.1)
Basophils Relative: 2 %
EOS ABS: 0.4 10*3/uL (ref 0.0–0.5)
Eosinophils Relative: 6 %
HCT: 39.9 % (ref 39.0–52.0)
HEMOGLOBIN: 12.8 g/dL — AB (ref 13.0–17.0)
IMMATURE GRANULOCYTES: 1 %
LYMPHS ABS: 1.7 10*3/uL (ref 0.7–4.0)
LYMPHS PCT: 24 %
MCH: 31.4 pg (ref 26.0–34.0)
MCHC: 32.1 g/dL (ref 30.0–36.0)
MCV: 97.8 fL (ref 80.0–100.0)
Monocytes Absolute: 0.5 10*3/uL (ref 0.1–1.0)
Monocytes Relative: 7 %
NEUTROS PCT: 60 %
NRBC: 0 % (ref 0.0–0.2)
Neutro Abs: 4.4 10*3/uL (ref 1.7–7.7)
Platelets: 527 10*3/uL — ABNORMAL HIGH (ref 150–400)
RBC: 4.08 MIL/uL — AB (ref 4.22–5.81)
RDW: 15.7 % — AB (ref 11.5–15.5)
WBC: 7.1 10*3/uL (ref 4.0–10.5)

## 2018-04-22 ENCOUNTER — Other Ambulatory Visit: Payer: Self-pay | Admitting: Nurse Practitioner

## 2018-04-22 ENCOUNTER — Telehealth: Payer: Self-pay | Admitting: *Deleted

## 2018-04-22 ENCOUNTER — Telehealth: Payer: Self-pay | Admitting: Hematology

## 2018-04-22 DIAGNOSIS — D473 Essential (hemorrhagic) thrombocythemia: Secondary | ICD-10-CM

## 2018-04-22 MED ORDER — HYDROXYUREA 500 MG PO CAPS: 500.0000 mg | ORAL_CAPSULE | Freq: Every day | ORAL | 1 refills | Status: DC

## 2018-04-22 NOTE — Telephone Encounter (Signed)
TCT patient. Reviewed labs with him from yesterday.  Per Cira Rue, NP, pt is to increase his Hydrea to daily, including Sunday. Pt voiced understanding of increase in hydrea.  He is aware of f/u appt in January. No further questions or concerns.

## 2018-04-22 NOTE — Telephone Encounter (Signed)
Scheduled appt per 11/22 sch message - left message with appt date and time

## 2018-04-26 ENCOUNTER — Telehealth: Payer: Self-pay | Admitting: Hematology

## 2018-04-26 NOTE — Telephone Encounter (Signed)
Tried to reach regarding 12/16 I did leave a message

## 2018-05-16 ENCOUNTER — Inpatient Hospital Stay: Payer: Medicare PPO | Attending: Hematology

## 2018-05-16 DIAGNOSIS — D473 Essential (hemorrhagic) thrombocythemia: Secondary | ICD-10-CM | POA: Diagnosis not present

## 2018-05-16 DIAGNOSIS — D472 Monoclonal gammopathy: Secondary | ICD-10-CM

## 2018-05-16 DIAGNOSIS — D509 Iron deficiency anemia, unspecified: Secondary | ICD-10-CM

## 2018-05-16 LAB — CBC WITH DIFFERENTIAL/PLATELET
Abs Immature Granulocytes: 0.02 10*3/uL (ref 0.00–0.07)
BASOS PCT: 2 %
Basophils Absolute: 0.1 10*3/uL (ref 0.0–0.1)
EOS ABS: 0.3 10*3/uL (ref 0.0–0.5)
Eosinophils Relative: 4 %
HCT: 40 % (ref 39.0–52.0)
Hemoglobin: 13 g/dL (ref 13.0–17.0)
Immature Granulocytes: 0 %
Lymphocytes Relative: 28 %
Lymphs Abs: 1.7 10*3/uL (ref 0.7–4.0)
MCH: 32.2 pg (ref 26.0–34.0)
MCHC: 32.5 g/dL (ref 30.0–36.0)
MCV: 99 fL (ref 80.0–100.0)
Monocytes Absolute: 0.5 10*3/uL (ref 0.1–1.0)
Monocytes Relative: 8 %
Neutro Abs: 3.6 10*3/uL (ref 1.7–7.7)
Neutrophils Relative %: 58 %
PLATELETS: 498 10*3/uL — AB (ref 150–400)
RBC: 4.04 MIL/uL — ABNORMAL LOW (ref 4.22–5.81)
RDW: 15.2 % (ref 11.5–15.5)
WBC: 6.2 10*3/uL (ref 4.0–10.5)
nRBC: 0 % (ref 0.0–0.2)

## 2018-05-16 LAB — CMP (CANCER CENTER ONLY)
ALT: 9 U/L (ref 0–44)
AST: 17 U/L (ref 15–41)
Albumin: 3.9 g/dL (ref 3.5–5.0)
Alkaline Phosphatase: 62 U/L (ref 38–126)
Anion gap: 11 (ref 5–15)
BUN: 15 mg/dL (ref 8–23)
CO2: 24 mmol/L (ref 22–32)
Calcium: 9.7 mg/dL (ref 8.9–10.3)
Chloride: 108 mmol/L (ref 98–111)
Creatinine: 1.23 mg/dL (ref 0.61–1.24)
GFR, EST NON AFRICAN AMERICAN: 54 mL/min — AB (ref >60)
GFR, Est AFR Am: >60 mL/min (ref >60)
Glucose, Bld: 105 mg/dL — ABNORMAL HIGH (ref 70–99)
POTASSIUM: 3.9 mmol/L (ref 3.5–5.1)
Sodium: 143 mmol/L (ref 135–145)
Total Bilirubin: 0.4 mg/dL (ref 0.3–1.2)
Total Protein: 7.3 g/dL (ref 6.5–8.1)

## 2018-05-17 LAB — IRON AND TIBC
IRON: 180 ug/dL — AB (ref 42–163)
Saturation Ratios: 64 % — ABNORMAL HIGH (ref 20–55)
TIBC: 283 ug/dL (ref 202–409)
UIBC: 103 ug/dL — ABNORMAL LOW (ref 117–376)

## 2018-05-17 LAB — FERRITIN: Ferritin: 62 ng/mL (ref 24–336)

## 2018-05-20 LAB — MULTIPLE MYELOMA PANEL, SERUM
ALBUMIN SERPL ELPH-MCNC: 3.9 g/dL (ref 2.9–4.4)
Albumin/Glob SerPl: 1.4 (ref 0.7–1.7)
Alpha 1: 0.2 g/dL (ref 0.0–0.4)
Alpha2 Glob SerPl Elph-Mcnc: 0.8 g/dL (ref 0.4–1.0)
B-Globulin SerPl Elph-Mcnc: 0.8 g/dL (ref 0.7–1.3)
GLOBULIN, TOTAL: 2.8 g/dL (ref 2.2–3.9)
Gamma Glob SerPl Elph-Mcnc: 1 g/dL (ref 0.4–1.8)
IgA: 125 mg/dL (ref 61–437)
IgG (Immunoglobin G), Serum: 1141 mg/dL (ref 700–1600)
IgM (Immunoglobulin M), Srm: 60 mg/dL (ref 15–143)
M Protein SerPl Elph-Mcnc: 0.2 g/dL — ABNORMAL HIGH
Total Protein ELP: 6.7 g/dL (ref 6.0–8.5)

## 2018-05-23 ENCOUNTER — Telehealth: Payer: Self-pay

## 2018-05-23 ENCOUNTER — Other Ambulatory Visit: Payer: Medicare PPO

## 2018-05-23 NOTE — Telephone Encounter (Signed)
Left voice message for patient per Dr. Burr Medico recent lab results show anemia has resolved, iron level is good, no concerns.

## 2018-05-23 NOTE — Telephone Encounter (Signed)
-----   Message from Truitt Merle, MD sent at 05/22/2018  1:17 PM EST ----- Please let pt know his lab results,anemia resolved npw, iron level is good, no concerns, thanks   Truitt Merle  05/22/2018

## 2018-06-20 ENCOUNTER — Telehealth: Payer: Self-pay | Admitting: Hematology

## 2018-06-20 ENCOUNTER — Inpatient Hospital Stay: Payer: Medicare PPO | Attending: Hematology

## 2018-06-20 ENCOUNTER — Encounter: Payer: Self-pay | Admitting: Hematology

## 2018-06-20 ENCOUNTER — Inpatient Hospital Stay (HOSPITAL_BASED_OUTPATIENT_CLINIC_OR_DEPARTMENT_OTHER): Payer: Medicare PPO | Admitting: Hematology

## 2018-06-20 VITALS — BP 141/77 | HR 75 | Temp 97.6°F | Resp 18 | Ht 69.0 in | Wt 153.4 lb

## 2018-06-20 DIAGNOSIS — K219 Gastro-esophageal reflux disease without esophagitis: Secondary | ICD-10-CM

## 2018-06-20 DIAGNOSIS — D472 Monoclonal gammopathy: Secondary | ICD-10-CM | POA: Diagnosis not present

## 2018-06-20 DIAGNOSIS — Z7982 Long term (current) use of aspirin: Secondary | ICD-10-CM | POA: Insufficient documentation

## 2018-06-20 DIAGNOSIS — D509 Iron deficiency anemia, unspecified: Secondary | ICD-10-CM

## 2018-06-20 DIAGNOSIS — R42 Dizziness and giddiness: Secondary | ICD-10-CM | POA: Insufficient documentation

## 2018-06-20 DIAGNOSIS — Z8546 Personal history of malignant neoplasm of prostate: Secondary | ICD-10-CM

## 2018-06-20 DIAGNOSIS — M549 Dorsalgia, unspecified: Secondary | ICD-10-CM | POA: Diagnosis not present

## 2018-06-20 DIAGNOSIS — Z79899 Other long term (current) drug therapy: Secondary | ICD-10-CM

## 2018-06-20 DIAGNOSIS — E039 Hypothyroidism, unspecified: Secondary | ICD-10-CM | POA: Insufficient documentation

## 2018-06-20 DIAGNOSIS — D473 Essential (hemorrhagic) thrombocythemia: Secondary | ICD-10-CM | POA: Diagnosis not present

## 2018-06-20 DIAGNOSIS — E785 Hyperlipidemia, unspecified: Secondary | ICD-10-CM | POA: Insufficient documentation

## 2018-06-20 DIAGNOSIS — I10 Essential (primary) hypertension: Secondary | ICD-10-CM

## 2018-06-20 LAB — CBC WITH DIFFERENTIAL/PLATELET
Abs Immature Granulocytes: 0.03 10*3/uL (ref 0.00–0.07)
Basophils Absolute: 0.1 10*3/uL (ref 0.0–0.1)
Basophils Relative: 1 %
Eosinophils Absolute: 0.2 10*3/uL (ref 0.0–0.5)
Eosinophils Relative: 4 %
HCT: 40.3 % (ref 39.0–52.0)
Hemoglobin: 12.9 g/dL — ABNORMAL LOW (ref 13.0–17.0)
Immature Granulocytes: 0 %
Lymphocytes Relative: 22 %
Lymphs Abs: 1.6 10*3/uL (ref 0.7–4.0)
MCH: 32.6 pg (ref 26.0–34.0)
MCHC: 32 g/dL (ref 30.0–36.0)
MCV: 101.8 fL — ABNORMAL HIGH (ref 80.0–100.0)
Monocytes Absolute: 0.6 10*3/uL (ref 0.1–1.0)
Monocytes Relative: 8 %
NEUTROS ABS: 4.4 10*3/uL (ref 1.7–7.7)
Neutrophils Relative %: 65 %
Platelets: 516 10*3/uL — ABNORMAL HIGH (ref 150–400)
RBC: 3.96 MIL/uL — ABNORMAL LOW (ref 4.22–5.81)
RDW: 14.6 % (ref 11.5–15.5)
WBC: 6.9 10*3/uL (ref 4.0–10.5)
nRBC: 0 % (ref 0.0–0.2)

## 2018-06-20 LAB — IRON AND TIBC
Iron: 115 ug/dL (ref 42–163)
SATURATION RATIOS: 39 % (ref 20–55)
TIBC: 298 ug/dL (ref 202–409)
UIBC: 183 ug/dL (ref 117–376)

## 2018-06-20 LAB — CMP (CANCER CENTER ONLY)
ALT: 10 U/L (ref 0–44)
AST: 17 U/L (ref 15–41)
Albumin: 3.9 g/dL (ref 3.5–5.0)
Alkaline Phosphatase: 69 U/L (ref 38–126)
Anion gap: 9 (ref 5–15)
BUN: 12 mg/dL (ref 8–23)
CO2: 25 mmol/L (ref 22–32)
Calcium: 9.5 mg/dL (ref 8.9–10.3)
Chloride: 107 mmol/L (ref 98–111)
Creatinine: 1.17 mg/dL (ref 0.61–1.24)
GFR, Est AFR Am: >60 mL/min (ref >60)
GFR, Estimated: 58 mL/min — ABNORMAL LOW (ref >60)
Glucose, Bld: 82 mg/dL (ref 70–99)
POTASSIUM: 3.9 mmol/L (ref 3.5–5.1)
Sodium: 141 mmol/L (ref 135–145)
Total Bilirubin: 0.6 mg/dL (ref 0.3–1.2)
Total Protein: 7.3 g/dL (ref 6.5–8.1)

## 2018-06-20 LAB — FERRITIN: Ferritin: 80 ng/mL (ref 24–336)

## 2018-06-20 MED ORDER — HYDROXYUREA 500 MG PO CAPS: 500.0000 mg | ORAL_CAPSULE | Freq: Every day | ORAL | 1 refills | Status: DC

## 2018-06-20 MED ORDER — HYDROXYUREA 500 MG PO CAPS: 500.0000 mg | ORAL_CAPSULE | Freq: Every day | ORAL | 5 refills | Status: DC

## 2018-06-20 NOTE — Progress Notes (Signed)
[place]   Telephone:(336) [phone] [phone]) [phone]   Clinic Follow up Note   Patient Care Team: Wenda Low, MD as PCP - General (Internal Medicine) Nobie Putnam, MD (Hematology and Oncology) Garlan Fair, MD as Attending Physician (Gastroenterology) Rana Snare, MD as Attending Physician (Urology)  Date of Service:  06/20/2018  CHIEF COMPLAINT: Follow up on essential thrombocytosis  PREVIOUS THERAPY: Hydrea 500 mg daily, from 08/2012 to 04/01/2015, stopped on 04/02/2015 due to recurrent episodes of dizziness, restarted on 06/08/2015. He tried anagrelide 0.14m bid for one month in 05/2015 which was not effective. Startled Hydrea 500 mg Monday-Saturday, hold on sundays due to anemia on 07/19/17, and decreased to 5026mdaily except Wednesday and Sunday on 12/06/2017, increased back to 50030maily except on Sundays in 01/2018  CURRENT THERAPY:  hydrea 500m73mily except Sundays. Due to elevated PLT ct will increase to 500mg34mly starting 06/20/18  INTERVAL HISTORY:  Donat STATON MARKEYere for a follow up of ET. He presents to the clinic today by himself. He notes he is doing well and stable. He notes he still gets dizzy spells mostly when sitting or moving around. He has had no episodes of a fall.      REVIEW OF SYSTEMS:   Constitutional: Denies fevers, chills or abnormal weight loss (+) Dizzy spells  Eyes: Denies blurriness of vision Ears, nose, mouth, throat, and face: Denies mucositis or sore throat Respiratory: Denies cough, dyspnea or wheezes Cardiovascular: Denies palpitation, chest discomfort or lower extremity swelling Gastrointestinal:  Denies nausea, heartburn or change in bowel habits Skin: Denies abnormal skin rashes Lymphatics: Denies new lymphadenopathy or easy bruising Neurological:Denies numbness, tingling or new weaknesses Behavioral/Psych: Mood is stable, no new changes  All other systems were reviewed with the patient and are  negative.  MEDICAL HISTORY:  Past Medical History:  Diagnosis Date  . Back pain   . Essential thrombocytosis (HCC) [id number]   JAK-2 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   . Essential thrombocytosis (HCC) [place]8/2014  . GERD (gastroesophageal reflux disease)   . Hernia   . Hyperlipidemia   . Hypertension   . Hypothyroid   . Prostate cancer (HCC)[facility] SURGICAL HISTORY: Past Surgical History:  Procedure Laterality Date  . BACK SURGERY    . COLONOSCOPY  2003  . COLONOSCOPY N/A 01/17/2013   Procedure: COLONOSCOPY;  Surgeon: MartiGarlan Fair  Location: [facility];  Service: Endoscopy;  Laterality: N/A;  . exp lap for trauma    . HERNIA REPAIR    . INCISION AND DRAINAGE ABSCESS ANAL  08/01/05  . PROSTATE SURGERY    . SHOULDER SURGERY     rt    I have reviewed the social history and family history with the patient and they are unchanged from previous note.  ALLERGIES:  has No Known Allergies.  MEDICATIONS:  Current Outpatient Medications  Medication Sig Dispense Refill  . aspirin 81 MG tablet Take 81 mg by mouth daily.      . atoMarland Kitchenvastatin (LIPITOR) 40 MG tablet Take 40 mg by mouth daily.      . dilMarland Kitcheniazem (CARDIZEM CD) 240 MG 24 hr capsule Take 240 mg by mouth daily.      . finasteride (PROSCAR) 5 MG tablet Take 5 mg by mouth daily.      . hydrochlorothiazide 25 MG tablet Take 25 mg by mouth daily.      . hydroxyurea (HYDREA) 500 MG capsule Take 1 capsule (500 mg  total) by mouth daily. 90 capsule 1  . levothyroxine (SYNTHROID, LEVOTHROID) 25 MCG tablet Take 25 mcg by mouth daily.      Marland Kitchen lisinopril (PRINIVIL,ZESTRIL) 20 MG tablet Take 20 mg by mouth daily.  6  . meclizine (ANTIVERT) 25 MG tablet Take 25 mg by mouth as needed. Reported on 11/11/2015  3  . mirtazapine (REMERON) 15 MG tablet Take 15 mg by mouth at bedtime.     . pantoprazole (PROTONIX) 40 MG tablet Take 40 mg by mouth daily.      . psyllium (METAMUCIL) 58.6 % powder Take 1 packet by mouth 3 (three)  times daily. Reported on 11/11/2015    . tiZANidine (ZANAFLEX) 4 MG tablet Take 4 mg by mouth daily. Reported on 11/11/2015     No current facility-administered medications for this visit.     PHYSICAL EXAMINATION: ECOG PERFORMANCE STATUS: 1 - Symptomatic but completely ambulatory  Vitals:   06/20/18 1113  BP: (!) 141/77  Pulse: 75  Resp: 18  Temp: 97.6 F (36.4 C)  SpO2: 100%   Filed Weights   06/20/18 1113  Weight: 153 lb 6.4 oz (69.6 kg)    GENERAL:alert, no distress and comfortable SKIN: skin color, texture, turgor are normal, no rashes or significant lesions EYES: normal, Conjunctiva are pink and non-injected, sclera clear OROPHARYNX:no exudate, no erythema and lips, buccal mucosa, and tongue normal  NECK: supple, thyroid normal size, non-tender, without nodularity LYMPH:  no palpable lymphadenopathy in the cervical, axillary or inguinal LUNGS: clear to auscultation and percussion with normal breathing effort HEART: regular rate & rhythm and no murmurs and no lower extremity edema ABDOMEN:abdomen soft, non-tender and normal bowel sounds Musculoskeletal:no cyanosis of digits and no clubbing  NEURO: alert & oriented x 3 with fluent speech, no focal motor/sensory deficits  LABORATORY DATA:  I have reviewed the data as listed CBC Latest Ref Rng & Units 06/20/2018 05/16/2018 04/21/2018  WBC 4.0 - 10.5 K/uL 6.9 6.2 7.1  Hemoglobin 13.0 - 17.0 g/dL 12.9(L) 13.0 12.8(L)  Hematocrit 39.0 - 52.0 % 40.3 40.0 39.9  Platelets 150 - 400 K/uL 516(H) 498(H) 527(H)     CMP Latest Ref Rng & Units 06/20/2018 05/16/2018 04/21/2018  Glucose 70 - 99 mg/dL 82 105(H) 108(H)  BUN 8 - 23 mg/dL _0 Creatinine 0.61 - 1.24 mg/dL 1.17 1.23 1.23  Sodium 135 - 145 mmol/L 141 143 140  Potassium 3.5 - 5.1 mmol/L 3.9 3.9 4.1  Chloride 98 - 111 mmol/L 107 108 106  CO2 22 - 32 mmol/L _1 Calcium 8.9 - 10.3 mg/dL 9.5 9.7 9.5  Total Protein 6.5 - 8.1 g/dL 7.3 7.3 7.4  Total Bilirubin 0.3  - 1.2 mg/dL 0.6 0.4 0.4  Alkaline Phos 38 - 126 U/L 69 62 61  AST 15 - 41 U/L _2 ALT 0 - 44 U/L _3 RADIOGRAPHIC STUDIES: I have personally reviewed the radiological images as listed and agreed with the findings in the report. No results found.   ASSESSMENT & PLAN:  Ricky Santos is a 85 y.o. male with    1. Essential Thrombocytosis. JAK2(+) -He was diagnosed in March 2014, initial platelet count in the 500k range, JAK2 mutation (+), he did not have bone marrow biopsy.  -We previously discussed that this is a myeloproliferative disorder, people usually do very well, and there is small percentage pts will develop myelofibrosis and AML late on.  -  We again reviewed the increased risks of thrombosis from ET, which is the main complication.  -Due to his intermittent dizziness, Hydrea was switched to anagrelide 0.50m bid for one month but it did not control his plt well, could be related to its low dose.  [facility].  -He is currently on Hydrea 5026mdaily except Sundays -Labs reviewed, Hg at 12.9, PLT elevated at 516. As Anemia has improved and elevated Plt have persisted , this is due to disease, not from [place] increase Hydrea to 50073maily. CMP, MM and Iron panels are still pending.  -Lab in 1 month, f/u in 3 months    2. Anemia of iron deficiency  -Etiology of iron deficiency remains unclear. His EGD and colonoscopy with Dr. SchMichail Sermon 03/2018 were benign  -Hg at 12.9, Iron panel is still pending today (06/20/18), iron study last month was normal  -Continue oral ferrous sulfate once daily   3. MGUS -M spike 0.2 with IgG monoclonal protein with lambda light chain specificity on 12/06/17.  -Cr on 10/11/17 was elevated to 1.34, normal previously; has normalized on 02/14/18 -01/24/18 bone survey is negative for lytic lesions -Last M-Protein in 05/2018 was 0.2, today's level is pending. Will continue monitoring     4. Intermittent dizziness -unlikely related to hydrea -He previously participated a course of inner ear balance PT without any success  -Stable with no incident of falls  -he will follow-up with Dr. HusLysle Rubenslan -Increase hydrea to 500m74mily, refilled today  -Lab in one month -Lab and f/u in 3 months      No problem-specific Assessment & Plan notes found for this encounter.   No orders of the defined types were placed in this encounter.  All questions were answered. The patient knows to call the clinic with any problems, questions or concerns. No barriers to learning was detected. I spent 15 minutes counseling the patient face to face. The total time spent in the appointment was 20 minutes and more than 50% was on counseling and review of test results     Kaily Wragg Truitt Merle 06/20/2018   I, AmoyJoslyn Devon acting as scribe for Joci Dress Truitt Merle.   I have reviewed the above documentation for accuracy and completeness, and I agree with the above.

## 2018-06-20 NOTE — Telephone Encounter (Signed)
Scheduled appts per 01/20 los. Printed calendar and avs.

## 2018-06-22 LAB — MULTIPLE MYELOMA PANEL, SERUM
Albumin SerPl Elph-Mcnc: 4 g/dL (ref 2.9–4.4)
Albumin/Glob SerPl: 1.4 (ref 0.7–1.7)
Alpha 1: 0.2 g/dL (ref 0.0–0.4)
Alpha2 Glob SerPl Elph-Mcnc: 0.8 g/dL (ref 0.4–1.0)
B-Globulin SerPl Elph-Mcnc: 0.8 g/dL (ref 0.7–1.3)
Gamma Glob SerPl Elph-Mcnc: 1 g/dL (ref 0.4–1.8)
Globulin, Total: 2.9 g/dL (ref 2.2–3.9)
IgA: 125 mg/dL (ref 61–437)
IgG (Immunoglobin G), Serum: 1072 mg/dL (ref 700–1600)
IgM (Immunoglobulin M), Srm: 55 mg/dL (ref 15–143)
M Protein SerPl Elph-Mcnc: 0.3 g/dL — ABNORMAL HIGH
Total Protein ELP: 6.9 g/dL (ref 6.0–8.5)

## 2018-06-23 ENCOUNTER — Telehealth: Payer: Self-pay

## 2018-06-23 NOTE — Telephone Encounter (Signed)
-----   Message from Truitt Merle, MD sent at 06/22/2018  7:18 AM EST ----- Please let pt know his iron study was normal from 2 days ago, thanks   [employer]  06/22/2018

## 2018-06-23 NOTE — Telephone Encounter (Signed)
Left voice message for patient that iron study was normal per Dr. Burr Medico

## 2018-07-22 ENCOUNTER — Telehealth: Payer: Self-pay | Admitting: Hematology

## 2018-07-22 NOTE — Telephone Encounter (Signed)
Called patient per 2/21 sch message - unable to reach patient . Left message for pt to call back to r./s

## 2018-07-25 ENCOUNTER — Inpatient Hospital Stay: Payer: Medicare PPO

## 2018-07-25 ENCOUNTER — Inpatient Hospital Stay: Payer: Medicare PPO | Attending: Hematology

## 2018-07-25 DIAGNOSIS — Z79899 Other long term (current) drug therapy: Secondary | ICD-10-CM | POA: Diagnosis not present

## 2018-07-25 DIAGNOSIS — D473 Essential (hemorrhagic) thrombocythemia: Secondary | ICD-10-CM | POA: Insufficient documentation

## 2018-07-25 DIAGNOSIS — D509 Iron deficiency anemia, unspecified: Secondary | ICD-10-CM | POA: Insufficient documentation

## 2018-07-25 DIAGNOSIS — Z7982 Long term (current) use of aspirin: Secondary | ICD-10-CM | POA: Insufficient documentation

## 2018-07-25 DIAGNOSIS — D472 Monoclonal gammopathy: Secondary | ICD-10-CM | POA: Diagnosis not present

## 2018-07-25 LAB — CBC WITH DIFFERENTIAL/PLATELET
Abs Immature Granulocytes: 0.03 10*3/uL (ref 0.00–0.07)
Basophils Absolute: 0.1 10*3/uL (ref 0.0–0.1)
Basophils Relative: 2 %
EOS ABS: 0.3 10*3/uL (ref 0.0–0.5)
Eosinophils Relative: 5 %
HCT: 40.1 % (ref 39.0–52.0)
Hemoglobin: 12.9 g/dL — ABNORMAL LOW (ref 13.0–17.0)
Immature Granulocytes: 1 %
LYMPHS ABS: 1.4 10*3/uL (ref 0.7–4.0)
Lymphocytes Relative: 25 %
MCH: 32.5 pg (ref 26.0–34.0)
MCHC: 32.2 g/dL (ref 30.0–36.0)
MCV: 101 fL — ABNORMAL HIGH (ref 80.0–100.0)
Monocytes Absolute: 0.4 10*3/uL (ref 0.1–1.0)
Monocytes Relative: 7 %
NRBC: 0 % (ref 0.0–0.2)
Neutro Abs: 3.3 10*3/uL (ref 1.7–7.7)
Neutrophils Relative %: 60 %
Platelets: 520 10*3/uL — ABNORMAL HIGH (ref 150–400)
RBC: 3.97 MIL/uL — ABNORMAL LOW (ref 4.22–5.81)
RDW: 14 % (ref 11.5–15.5)
WBC: 5.6 10*3/uL (ref 4.0–10.5)

## 2018-07-25 LAB — IRON AND TIBC
Iron: 121 ug/dL (ref 42–163)
Saturation Ratios: 40 % (ref 20–55)
TIBC: 301 ug/dL (ref 202–409)
UIBC: 180 ug/dL (ref 117–376)

## 2018-07-25 LAB — CMP (CANCER CENTER ONLY)
ALBUMIN: 4.1 g/dL (ref 3.5–5.0)
ALT: 10 U/L (ref 0–44)
AST: 20 U/L (ref 15–41)
Alkaline Phosphatase: 63 U/L (ref 38–126)
Anion gap: 9 (ref 5–15)
BUN: 15 mg/dL (ref 8–23)
CO2: 24 mmol/L (ref 22–32)
Calcium: 9.4 mg/dL (ref 8.9–10.3)
Chloride: 107 mmol/L (ref 98–111)
Creatinine: 1.12 mg/dL (ref 0.61–1.24)
GFR, Est AFR Am: >60 mL/min (ref >60)
GFR, Estimated: >60 mL/min (ref >60)
Glucose, Bld: 82 mg/dL (ref 70–99)
Potassium: 3.9 mmol/L (ref 3.5–5.1)
Sodium: 140 mmol/L (ref 135–145)
Total Bilirubin: 0.5 mg/dL (ref 0.3–1.2)
Total Protein: 7.1 g/dL (ref 6.5–8.1)

## 2018-07-25 LAB — FERRITIN: Ferritin: 87 ng/mL (ref 24–336)

## 2018-07-26 ENCOUNTER — Telehealth: Payer: Self-pay | Admitting: Hematology

## 2018-07-26 ENCOUNTER — Telehealth: Payer: Self-pay

## 2018-07-26 NOTE — Telephone Encounter (Signed)
Spoke with patient regarding lab results, platelets are still high, per Dr. Burr Medico instructed him to increase Hydrea to 1000 mg daily on Mondays and Fridays and continue 500 mg for the rest of the days of the week, patient verbalized an understanding and was able to repeat my instructions back to me.  Will set up lab appointment in one month, scheduling message was sent.

## 2018-07-26 NOTE — Telephone Encounter (Signed)
Scheduled appt per 2/25 sch message - pt is aware of apt date and time

## 2018-07-26 NOTE — Telephone Encounter (Signed)
-----   Message from Truitt Merle, MD sent at 07/25/2018  8:43 PM EST ----- Please let pt know his lab results. Plt still high, I recommend him to increase to 1000mg  daily on Mondays and Fridays and continue 500mg  daily for the rest of week, add lab in 1 month, thanks  [employer]  07/25/2018

## 2018-07-27 LAB — MULTIPLE MYELOMA PANEL, SERUM
Albumin SerPl Elph-Mcnc: 3.8 g/dL (ref 2.9–4.4)
Albumin/Glob SerPl: 1.5 (ref 0.7–1.7)
Alpha 1: 0.2 g/dL (ref 0.0–0.4)
Alpha2 Glob SerPl Elph-Mcnc: 0.7 g/dL (ref 0.4–1.0)
B-Globulin SerPl Elph-Mcnc: 0.8 g/dL (ref 0.7–1.3)
Gamma Glob SerPl Elph-Mcnc: 1 g/dL (ref 0.4–1.8)
Globulin, Total: 2.7 g/dL (ref 2.2–3.9)
IgA: 122 mg/dL (ref 61–437)
IgG (Immunoglobin G), Serum: 1091 mg/dL (ref 700–1600)
IgM (Immunoglobulin M), Srm: 54 mg/dL (ref 15–143)
M Protein SerPl Elph-Mcnc: 0.2 g/dL — ABNORMAL HIGH
Total Protein ELP: 6.5 g/dL (ref 6.0–8.5)

## 2018-08-01 DIAGNOSIS — E049 Nontoxic goiter, unspecified: Secondary | ICD-10-CM | POA: Diagnosis not present

## 2018-08-01 DIAGNOSIS — D473 Essential (hemorrhagic) thrombocythemia: Secondary | ICD-10-CM | POA: Diagnosis not present

## 2018-08-01 DIAGNOSIS — E039 Hypothyroidism, unspecified: Secondary | ICD-10-CM | POA: Diagnosis not present

## 2018-08-01 DIAGNOSIS — Z1389 Encounter for screening for other disorder: Secondary | ICD-10-CM | POA: Diagnosis not present

## 2018-08-01 DIAGNOSIS — Z0000 Encounter for general adult medical examination without abnormal findings: Secondary | ICD-10-CM | POA: Diagnosis not present

## 2018-08-01 DIAGNOSIS — D509 Iron deficiency anemia, unspecified: Secondary | ICD-10-CM | POA: Diagnosis not present

## 2018-08-01 DIAGNOSIS — E782 Mixed hyperlipidemia: Secondary | ICD-10-CM | POA: Diagnosis not present

## 2018-08-01 DIAGNOSIS — I10 Essential (primary) hypertension: Secondary | ICD-10-CM | POA: Diagnosis not present

## 2018-08-01 DIAGNOSIS — N400 Enlarged prostate without lower urinary tract symptoms: Secondary | ICD-10-CM | POA: Diagnosis not present

## 2018-08-22 ENCOUNTER — Telehealth: Payer: Self-pay | Admitting: *Deleted

## 2018-08-22 ENCOUNTER — Other Ambulatory Visit: Payer: Self-pay

## 2018-08-22 ENCOUNTER — Inpatient Hospital Stay: Payer: Medicare PPO | Attending: Hematology

## 2018-08-22 DIAGNOSIS — D473 Essential (hemorrhagic) thrombocythemia: Secondary | ICD-10-CM | POA: Diagnosis not present

## 2018-08-22 DIAGNOSIS — D509 Iron deficiency anemia, unspecified: Secondary | ICD-10-CM

## 2018-08-22 DIAGNOSIS — D472 Monoclonal gammopathy: Secondary | ICD-10-CM

## 2018-08-22 LAB — CBC WITH DIFFERENTIAL/PLATELET
Abs Immature Granulocytes: 0.01 10*3/uL (ref 0.00–0.07)
Basophils Absolute: 0.1 10*3/uL (ref 0.0–0.1)
Basophils Relative: 3 %
EOS ABS: 0.2 10*3/uL (ref 0.0–0.5)
Eosinophils Relative: 4 %
HCT: 39.9 % (ref 39.0–52.0)
Hemoglobin: 13.2 g/dL (ref 13.0–17.0)
Immature Granulocytes: 0 %
Lymphocytes Relative: 30 %
Lymphs Abs: 1.2 10*3/uL (ref 0.7–4.0)
MCH: 33.9 pg (ref 26.0–34.0)
MCHC: 33.1 g/dL (ref 30.0–36.0)
MCV: 102.6 fL — AB (ref 80.0–100.0)
MONOS PCT: 6 %
Monocytes Absolute: 0.3 10*3/uL (ref 0.1–1.0)
Neutro Abs: 2.4 10*3/uL (ref 1.7–7.7)
Neutrophils Relative %: 57 %
Platelets: 412 10*3/uL — ABNORMAL HIGH (ref 150–400)
RBC: 3.89 MIL/uL — ABNORMAL LOW (ref 4.22–5.81)
RDW: 15.3 % (ref 11.5–15.5)
WBC: 4.2 10*3/uL (ref 4.0–10.5)
nRBC: 0 % (ref 0.0–0.2)

## 2018-08-22 LAB — CMP (CANCER CENTER ONLY)
ALT: 11 U/L (ref 0–44)
AST: 20 U/L (ref 15–41)
Albumin: 4.1 g/dL (ref 3.5–5.0)
Alkaline Phosphatase: 56 U/L (ref 38–126)
Anion gap: 11 (ref 5–15)
BUN: 15 mg/dL (ref 8–23)
CO2: 21 mmol/L — ABNORMAL LOW (ref 22–32)
Calcium: 9.3 mg/dL (ref 8.9–10.3)
Chloride: 107 mmol/L (ref 98–111)
Creatinine: 1.13 mg/dL (ref 0.61–1.24)
GFR, Est AFR Am: >60 mL/min (ref >60)
GFR, Estimated: >60 mL/min (ref >60)
Glucose, Bld: 88 mg/dL (ref 70–99)
Potassium: 4 mmol/L (ref 3.5–5.1)
Sodium: 139 mmol/L (ref 135–145)
Total Bilirubin: 0.6 mg/dL (ref 0.3–1.2)
Total Protein: 7.3 g/dL (ref 6.5–8.1)

## 2018-08-22 LAB — IRON AND TIBC
Iron: 216 ug/dL — ABNORMAL HIGH (ref 42–163)
Saturation Ratios: 74 % — ABNORMAL HIGH (ref 20–55)
TIBC: 293 ug/dL (ref 202–409)
UIBC: 76 ug/dL — ABNORMAL LOW (ref 117–376)

## 2018-08-22 LAB — FERRITIN: Ferritin: 94 ng/mL (ref 24–336)

## 2018-08-22 NOTE — Telephone Encounter (Signed)
-----   Message from Truitt Merle, MD sent at 08/22/2018  9:43 AM EDT ----- Please let pt know his CBC results, plt much improved since we increased hydrea dose one month ago, continue current dose 500mg  daily, and no other concerns. Thanks  Truitt Merle  08/22/2018

## 2018-08-22 NOTE — Telephone Encounter (Signed)
TCT patient regarding lab results from today.  Platelet count has improved. Pt is to continue current dose of hydrea (500 mg daily). No other concerns. F/u as scheduled.  VM message left with the above information on identified phone.

## 2018-08-23 LAB — MULTIPLE MYELOMA PANEL, SERUM
ALBUMIN/GLOB SERPL: 1.5 (ref 0.7–1.7)
Albumin SerPl Elph-Mcnc: 3.8 g/dL (ref 2.9–4.4)
Alpha 1: 0.2 g/dL (ref 0.0–0.4)
Alpha2 Glob SerPl Elph-Mcnc: 0.7 g/dL (ref 0.4–1.0)
B-Globulin SerPl Elph-Mcnc: 0.8 g/dL (ref 0.7–1.3)
Gamma Glob SerPl Elph-Mcnc: 1 g/dL (ref 0.4–1.8)
Globulin, Total: 2.7 g/dL (ref 2.2–3.9)
IGM (IMMUNOGLOBULIN M), SRM: 52 mg/dL (ref 15–143)
IgA: 123 mg/dL (ref 61–437)
IgG (Immunoglobin G), Serum: 1134 mg/dL (ref 700–1600)
Total Protein ELP: 6.5 g/dL (ref 6.0–8.5)

## 2018-09-05 DIAGNOSIS — I10 Essential (primary) hypertension: Secondary | ICD-10-CM | POA: Diagnosis not present

## 2018-09-12 ENCOUNTER — Telehealth: Payer: Self-pay | Admitting: Hematology

## 2018-09-12 NOTE — Telephone Encounter (Signed)
Postponed 4/20 appt for 1 month per sch msg. Called and spoke with patient. Confirmed date and time

## 2018-09-19 ENCOUNTER — Ambulatory Visit: Payer: Medicare PPO | Admitting: Hematology

## 2018-09-19 ENCOUNTER — Other Ambulatory Visit: Payer: Medicare PPO

## 2018-10-07 DIAGNOSIS — D509 Iron deficiency anemia, unspecified: Secondary | ICD-10-CM | POA: Diagnosis not present

## 2018-10-07 DIAGNOSIS — E039 Hypothyroidism, unspecified: Secondary | ICD-10-CM | POA: Diagnosis not present

## 2018-10-07 DIAGNOSIS — E785 Hyperlipidemia, unspecified: Secondary | ICD-10-CM | POA: Diagnosis not present

## 2018-10-07 DIAGNOSIS — K219 Gastro-esophageal reflux disease without esophagitis: Secondary | ICD-10-CM | POA: Diagnosis not present

## 2018-10-07 DIAGNOSIS — N400 Enlarged prostate without lower urinary tract symptoms: Secondary | ICD-10-CM | POA: Diagnosis not present

## 2018-10-07 DIAGNOSIS — D696 Thrombocytopenia, unspecified: Secondary | ICD-10-CM | POA: Diagnosis not present

## 2018-10-07 DIAGNOSIS — I10 Essential (primary) hypertension: Secondary | ICD-10-CM | POA: Diagnosis not present

## 2018-10-14 NOTE — Progress Notes (Signed)
[place] [facility]   Telephone:(336) [phone] Fax:(336) [phone]   Clinic Follow up Note   Patient Care Team: Wenda Low, MD as PCP - General (Internal Medicine) Nobie Putnam, MD (Hematology and Oncology) Garlan Fair, MD as Attending Physician (Gastroenterology) Rana Snare, MD as Attending Physician (Urology)  Date of Service:  10/17/2018  CHIEF COMPLAINT: Follow up on essential thrombocytosis  PREVIOUS THERAPY:Hydrea 500 mg daily, from 08/2012 to 04/01/2015, stopped on 04/02/2015 due to recurrent episodes of dizziness, restarted on 06/08/2015. He tried anagrelide 0.'5mg'$  bid for one month in 05/2015 which was not effective. Startled Hydrea 500 mg Monday-Saturday, hold on sundays due to anemia on 07/19/17, and decreased to '500mg'$  daily except Wednesday and Sunday on 12/06/2017, increased back to '500mg'$  daily except on Sundays in 01/2018  CURRENT THERAPY: hydrea '500mg'$  daily except Sundays. Due to elevated PLT ct will increase to '500mg'$  daily starting 06/20/18. Will increase to '1000mg'$  on mondays and '500mg'$  the rest of the week starting 10/17/18.    INTERVAL HISTORY:  Ricky Santos is here for a follow up of ET. He is here alone. He notes he is doing well. He noted he still has dizziness daily. He notes he is taking Hydrea daily. He denies bruising or swelling of his legs. He is overall stable.     REVIEW OF SYSTEMS:   Constitutional: Denies fevers, chills or abnormal weight loss (+) dizziness  Eyes: Denies blurriness of vision Ears, nose, mouth, throat, and face: Denies mucositis or sore throat Respiratory: Denies cough, dyspnea or wheezes Cardiovascular: Denies palpitation, chest discomfort or lower extremity swelling Gastrointestinal:  Denies nausea, heartburn or change in bowel habits Skin: Denies abnormal skin rashes Lymphatics: Denies new lymphadenopathy or easy bruising Neurological:Denies numbness, tingling or new weaknesses Behavioral/Psych: Mood is stable, no new  changes  All other systems were reviewed with the patient and are negative.  MEDICAL HISTORY:  Past Medical History:  Diagnosis Date  . Back pain   . Essential thrombocytosis ([place]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   . Essential thrombocytosis ([place]) 08/26/2012  . GERD (gastroesophageal reflux disease)   . Hernia   . Hyperlipidemia   . Hypertension   . Hypothyroid   . Prostate cancer [facility])     SURGICAL HISTORY: Past Surgical History:  Procedure Laterality Date  . BACK SURGERY    . COLONOSCOPY  2003  . COLONOSCOPY N/A 01/17/2013   Procedure: COLONOSCOPY;  Surgeon: Garlan Fair, MD;  Location: [facility];  Service: Endoscopy;  Laterality: N/A;  . exp lap for trauma    . HERNIA REPAIR    . INCISION AND DRAINAGE ABSCESS ANAL  08/01/05  . PROSTATE SURGERY    . SHOULDER SURGERY     rt    I have reviewed the social history and family history with the patient and they are unchanged from previous note.  ALLERGIES:  has No Known Allergies.  MEDICATIONS:  Current Outpatient Medications  Medication Sig Dispense Refill  . aspirin 81 MG tablet Take 81 mg by mouth daily.      Marland Kitchen atorvastatin (LIPITOR) 40 MG tablet Take 40 mg by mouth daily.      Marland Kitchen diltiazem (CARDIZEM CD) 240 MG 24 hr capsule Take 240 mg by mouth daily.      . finasteride (PROSCAR) 5 MG tablet Take 5 mg by mouth daily.      . hydrochlorothiazide 25 MG tablet Take 25 mg by mouth daily.      Marland Kitchen  hydroxyurea (HYDREA) 500 MG capsule Take 1 capsule (500 mg total) by mouth daily. Take 2 capsules daily on Mondays, and 1 capsule daily for the rest of week 100 capsule 0  . levothyroxine (SYNTHROID, LEVOTHROID) 25 MCG tablet Take 25 mcg by mouth daily.      Marland Kitchen lisinopril (PRINIVIL,ZESTRIL) 20 MG tablet Take 20 mg by mouth daily.  6  . meclizine (ANTIVERT) 25 MG tablet Take 25 mg by mouth as needed. Reported on 11/11/2015  3  . mirtazapine (REMERON) 15 MG tablet Take 15 mg by mouth at bedtime.     .  pantoprazole (PROTONIX) 40 MG tablet Take 40 mg by mouth daily.      . psyllium (METAMUCIL) 58.6 % powder Take 1 packet by mouth 3 (three) times daily. Reported on 11/11/2015     No current facility-administered medications for this visit.     PHYSICAL EXAMINATION: ECOG PERFORMANCE STATUS: 1 - Symptomatic but completely ambulatory  Vitals:   10/17/18 1335  BP: 129/70  Pulse: 83  Resp: 18  Temp: 97.8 F (36.6 C)  SpO2: 98%   Filed Weights   10/17/18 1335  Weight: 153 lb 1.6 oz (69.4 kg)    GENERAL:alert, no distress and comfortable SKIN: skin color, texture, turgor are normal, no rashes or significant lesions EYES: normal, Conjunctiva are pink and non-injected, sclera clear OROPHARYNX:no exudate, no erythema and lips, buccal mucosa, and tongue normal  NECK: supple, thyroid normal size, non-tender, without nodularity LYMPH:  no palpable lymphadenopathy in the cervical, axillary or inguinal LUNGS: clear to auscultation and percussion with normal breathing effort HEART: regular rate & rhythm and no murmurs and no lower extremity edema ABDOMEN:abdomen soft, non-tender and normal bowel sounds Musculoskeletal:no cyanosis of digits and no clubbing  NEURO: alert & oriented x 3 with fluent speech, no focal motor/sensory deficits  LABORATORY DATA:  I have reviewed the data as listed CBC Latest Ref Rng & Units 10/17/2018 08/22/2018 07/25/2018  WBC 4.0 - 10.5 K/uL 5.3 4.2 5.6  Hemoglobin 13.0 - 17.0 g/dL 13.3 13.2 12.9(L)  Hematocrit 39.0 - 52.0 % 41.1 39.9 40.1  Platelets 150 - 400 K/uL 540(H) 412(H) 520(H)     CMP Latest Ref Rng & Units 10/17/2018 08/22/2018 07/25/2018  Glucose 70 - 99 mg/dL 91 88 82  BUN 8 - 23 mg/dL '19 15 15  '$ Creatinine 0.61 - 1.24 mg/dL 1.28(H) 1.13 1.12  Sodium 135 - 145 mmol/L 140 139 140  Potassium 3.5 - 5.1 mmol/L 3.9 4.0 3.9  Chloride 98 - 111 mmol/L 106 107 107  CO2 22 - 32 mmol/L 23 21(L) 24  Calcium 8.9 - 10.3 mg/dL 9.4 9.3 9.4  Total Protein 6.5 - 8.1  g/dL 7.4 7.3 7.1  Total Bilirubin 0.3 - 1.2 mg/dL 0.3 0.6 0.5  Alkaline Phos 38 - 126 U/L 66 56 63  AST 15 - 41 U/L '19 20 20  '$ ALT 0 - 44 U/L '9 11 10      '$ RADIOGRAPHIC STUDIES: I have personally reviewed the radiological images as listed and agreed with the findings in the report. No results found.   ASSESSMENT & PLAN:  Ricky Santos is a 85 y.o. male with   1. Essential Thrombocytosis. JAK2(+) -He was diagnosed in March 2014, initial platelet count in the 500k range, JAK2 mutation (+), he did not have bone marrow biopsy.  -We previously discussed that this is a myeloproliferative disorder, people usually do very well, and there is small percentage pts will develop myelofibrosis  and AML late on.  -We again reviewed the increased risks of thrombosis from ET, which is the main complication.  -Due to his intermittent dizziness, Hydrea was switched to anagrelide 0.'5mg'$  bid for one month but it did not control his plt well, could be related to its low dose. He was switched back to [facility] with dose adjustment as needed.  -He is currently on Hydrea '500mg'$  daily.  -He is mainly clinically stable. He still has stable dizziness.  -Labs reviewed, CBC WNL except PLT 540K. Plt has increased. I will continue to slowly titrate his hydrea up for better control. Will increase hydrea to '1000mg'$  on Mondays and '500mg'$  daily for the rest of the week.  -CMP WNL except cr 1.28, MM panel and iron panel are still pending. Today. I encouraged him to drink more water, he is on HCTZ.  -Lab in 6 weeks, f/u in 3 months    2. Anemia of iron deficiency  -Etiology of iron deficiency remains unclear. His EGD andcolonoscopy with Dr. Michail Sermon in 03/2018 were benign  -anemia resolved today, Iron panel is still pending today (10/17/18), iron study last month was normal  -Continue oral ferrous sulfate once daily   3. MGUS -M spike 0.2 with IgG monoclonal protein with lambda light chain specificity on 12/06/17.  -Cr on  10/11/17 was elevated to 1.34, normal previously; has normalized on 02/14/18 -01/24/18 bone survey is negative for lytic lesions -Last M-Protein in 08/22/18 was not observed. Will continue monitoring MM panel still pending for today.    4. Intermittent dizziness -unlikely related to hydrea -He previously participated a course of inner ear balance PT without any success  -he will follow-up with Dr. Lysle Rubens -Stable with no incident of falls   Plan -Increase hydrea to '1000mg'$  daily on Mondays and '500mg'$  daily for  the rest of the week, refilled today  -Lab in 6 weeks -Lab and f/u in 3 months      No problem-specific Assessment & Plan notes found for this encounter.   No orders of the defined types were placed in this encounter.  All questions were answered. The patient knows to call the clinic with any problems, questions or concerns. No barriers to learning was detected. I spent 15 minutes counseling the patient face to face. The total time spent in the appointment was 20 minutes and more than 50% was on counseling and review of test results     Truitt Merle, MD 10/17/2018   I, Joslyn Devon, am acting as scribe for Truitt Merle, MD.   I have reviewed the above documentation for accuracy and completeness, and I agree with the above.

## 2018-10-17 ENCOUNTER — Inpatient Hospital Stay (HOSPITAL_BASED_OUTPATIENT_CLINIC_OR_DEPARTMENT_OTHER): Payer: Medicare PPO | Admitting: Hematology

## 2018-10-17 ENCOUNTER — Inpatient Hospital Stay: Payer: Medicare PPO | Attending: Hematology

## 2018-10-17 ENCOUNTER — Telehealth: Payer: Self-pay | Admitting: Hematology

## 2018-10-17 ENCOUNTER — Other Ambulatory Visit: Payer: Self-pay

## 2018-10-17 VITALS — BP 129/70 | HR 83 | Temp 97.8°F | Resp 18 | Ht 69.0 in | Wt 153.1 lb

## 2018-10-17 DIAGNOSIS — R42 Dizziness and giddiness: Secondary | ICD-10-CM

## 2018-10-17 DIAGNOSIS — I10 Essential (primary) hypertension: Secondary | ICD-10-CM

## 2018-10-17 DIAGNOSIS — M549 Dorsalgia, unspecified: Secondary | ICD-10-CM

## 2018-10-17 DIAGNOSIS — K219 Gastro-esophageal reflux disease without esophagitis: Secondary | ICD-10-CM

## 2018-10-17 DIAGNOSIS — Z7982 Long term (current) use of aspirin: Secondary | ICD-10-CM

## 2018-10-17 DIAGNOSIS — E785 Hyperlipidemia, unspecified: Secondary | ICD-10-CM | POA: Insufficient documentation

## 2018-10-17 DIAGNOSIS — D509 Iron deficiency anemia, unspecified: Secondary | ICD-10-CM

## 2018-10-17 DIAGNOSIS — E039 Hypothyroidism, unspecified: Secondary | ICD-10-CM

## 2018-10-17 DIAGNOSIS — D472 Monoclonal gammopathy: Secondary | ICD-10-CM

## 2018-10-17 DIAGNOSIS — Z8546 Personal history of malignant neoplasm of prostate: Secondary | ICD-10-CM | POA: Insufficient documentation

## 2018-10-17 DIAGNOSIS — Z79899 Other long term (current) drug therapy: Secondary | ICD-10-CM | POA: Diagnosis not present

## 2018-10-17 DIAGNOSIS — D473 Essential (hemorrhagic) thrombocythemia: Secondary | ICD-10-CM

## 2018-10-17 LAB — CBC WITH DIFFERENTIAL/PLATELET
Abs Immature Granulocytes: 0.01 10*3/uL (ref 0.00–0.07)
Basophils Absolute: 0.1 10*3/uL (ref 0.0–0.1)
Basophils Relative: 2 %
Eosinophils Absolute: 0.2 10*3/uL (ref 0.0–0.5)
Eosinophils Relative: 4 %
HCT: 41.1 % (ref 39.0–52.0)
Hemoglobin: 13.3 g/dL (ref 13.0–17.0)
Immature Granulocytes: 0 %
Lymphocytes Relative: 21 %
Lymphs Abs: 1.1 10*3/uL (ref 0.7–4.0)
MCH: 32.8 pg (ref 26.0–34.0)
MCHC: 32.4 g/dL (ref 30.0–36.0)
MCV: 101.2 fL — ABNORMAL HIGH (ref 80.0–100.0)
Monocytes Absolute: 0.3 10*3/uL (ref 0.1–1.0)
Monocytes Relative: 6 %
Neutro Abs: 3.6 10*3/uL (ref 1.7–7.7)
Neutrophils Relative %: 67 %
Platelets: 540 10*3/uL — ABNORMAL HIGH (ref 150–400)
RBC: 4.06 MIL/uL — ABNORMAL LOW (ref 4.22–5.81)
RDW: 14 % (ref 11.5–15.5)
WBC: 5.3 10*3/uL (ref 4.0–10.5)
nRBC: 0 % (ref 0.0–0.2)

## 2018-10-17 LAB — CMP (CANCER CENTER ONLY)
ALT: 9 U/L (ref 0–44)
AST: 19 U/L (ref 15–41)
Albumin: 4.1 g/dL (ref 3.5–5.0)
Alkaline Phosphatase: 66 U/L (ref 38–126)
Anion gap: 11 (ref 5–15)
BUN: 19 mg/dL (ref 8–23)
CO2: 23 mmol/L (ref 22–32)
Calcium: 9.4 mg/dL (ref 8.9–10.3)
Chloride: 106 mmol/L (ref 98–111)
Creatinine: 1.28 mg/dL — ABNORMAL HIGH (ref 0.61–1.24)
GFR, Est AFR Am: >60 mL/min (ref >60)
GFR, Estimated: 52 mL/min — ABNORMAL LOW (ref >60)
Glucose, Bld: 91 mg/dL (ref 70–99)
Potassium: 3.9 mmol/L (ref 3.5–5.1)
Sodium: 140 mmol/L (ref 135–145)
Total Bilirubin: 0.3 mg/dL (ref 0.3–1.2)
Total Protein: 7.4 g/dL (ref 6.5–8.1)

## 2018-10-17 LAB — IRON AND TIBC
Iron: 97 ug/dL (ref 42–163)
Saturation Ratios: 33 % (ref 20–55)
TIBC: 298 ug/dL (ref 202–409)
UIBC: 201 ug/dL (ref 117–376)

## 2018-10-17 LAB — FERRITIN: Ferritin: 82 ng/mL (ref 24–336)

## 2018-10-17 MED ORDER — HYDROXYUREA 500 MG PO CAPS: 500.0000 mg | ORAL_CAPSULE | Freq: Every day | ORAL | 0 refills | Status: DC

## 2018-10-17 NOTE — Telephone Encounter (Signed)
Scheduled appt per 5.18 los.  A calendar will bea mailed.

## 2018-10-18 ENCOUNTER — Encounter: Payer: Self-pay | Admitting: Hematology

## 2018-10-18 LAB — MULTIPLE MYELOMA PANEL, SERUM
Albumin SerPl Elph-Mcnc: 3.9 g/dL (ref 2.9–4.4)
Albumin/Glob SerPl: 1.4 (ref 0.7–1.7)
Alpha 1: 0.3 g/dL (ref 0.0–0.4)
Alpha2 Glob SerPl Elph-Mcnc: 0.8 g/dL (ref 0.4–1.0)
B-Globulin SerPl Elph-Mcnc: 0.9 g/dL (ref 0.7–1.3)
Gamma Glob SerPl Elph-Mcnc: 1.1 g/dL (ref 0.4–1.8)
Globulin, Total: 3 g/dL (ref 2.2–3.9)
IgA: 119 mg/dL (ref 61–437)
IgG (Immunoglobin G), Serum: 1091 mg/dL (ref 603–1613)
IgM (Immunoglobulin M), Srm: 52 mg/dL (ref 15–143)
M Protein SerPl Elph-Mcnc: 0.3 g/dL — ABNORMAL HIGH
Total Protein ELP: 6.9 g/dL (ref 6.0–8.5)

## 2018-10-19 ENCOUNTER — Telehealth: Payer: Self-pay

## 2018-10-19 NOTE — Telephone Encounter (Signed)
-----   Message from Truitt Merle, MD sent at 10/18/2018 10:41 PM EDT ----- Please let pt know his iron level is good, Cr slightly elevated, encourage him to drink more water, CBC results discussed during his visit, no other concerns, thanks  Truitt Merle  10/18/2018

## 2018-10-19 NOTE — Telephone Encounter (Signed)
Spoke with patient regarding lab results, per Dr. Burr Medico iron level is good, creatine is slightly elevated, instructed the patient to increase his water intake.  Patient verbalized an understanding and was appreciative of the call.

## 2018-10-21 ENCOUNTER — Telehealth: Payer: Self-pay

## 2018-10-21 NOTE — Telephone Encounter (Signed)
Faxed script clarification for Hydrea to [facility] at [phone]

## 2018-10-26 ENCOUNTER — Telehealth: Payer: Self-pay

## 2018-10-26 NOTE — Telephone Encounter (Signed)
Chart Review

## 2018-11-28 ENCOUNTER — Other Ambulatory Visit: Payer: Self-pay

## 2018-11-28 ENCOUNTER — Inpatient Hospital Stay: Payer: Medicare PPO | Attending: Hematology

## 2018-11-28 DIAGNOSIS — D473 Essential (hemorrhagic) thrombocythemia: Secondary | ICD-10-CM | POA: Insufficient documentation

## 2018-11-28 DIAGNOSIS — D472 Monoclonal gammopathy: Secondary | ICD-10-CM

## 2018-11-28 DIAGNOSIS — D509 Iron deficiency anemia, unspecified: Secondary | ICD-10-CM

## 2018-11-28 LAB — CBC WITH DIFFERENTIAL/PLATELET
Abs Immature Granulocytes: 0.02 10*3/uL (ref 0.00–0.07)
Basophils Absolute: 0.1 10*3/uL (ref 0.0–0.1)
Basophils Relative: 2 %
Eosinophils Absolute: 0.2 10*3/uL (ref 0.0–0.5)
Eosinophils Relative: 3 %
HCT: 41.6 % (ref 39.0–52.0)
Hemoglobin: 13.6 g/dL (ref 13.0–17.0)
Immature Granulocytes: 0 %
Lymphocytes Relative: 24 %
Lymphs Abs: 1.5 10*3/uL (ref 0.7–4.0)
MCH: 32.6 pg (ref 26.0–34.0)
MCHC: 32.7 g/dL (ref 30.0–36.0)
MCV: 99.8 fL (ref 80.0–100.0)
Monocytes Absolute: 0.3 10*3/uL (ref 0.1–1.0)
Monocytes Relative: 5 %
Neutro Abs: 4 10*3/uL (ref 1.7–7.7)
Neutrophils Relative %: 66 %
Platelets: 519 10*3/uL — ABNORMAL HIGH (ref 150–400)
RBC: 4.17 MIL/uL — ABNORMAL LOW (ref 4.22–5.81)
RDW: 14.1 % (ref 11.5–15.5)
WBC: 6.1 10*3/uL (ref 4.0–10.5)
nRBC: 0 % (ref 0.0–0.2)

## 2018-11-28 LAB — CMP (CANCER CENTER ONLY)
ALT: 12 U/L (ref 0–44)
AST: 21 U/L (ref 15–41)
Albumin: 4.2 g/dL (ref 3.5–5.0)
Alkaline Phosphatase: 60 U/L (ref 38–126)
Anion gap: 10 (ref 5–15)
BUN: 16 mg/dL (ref 8–23)
CO2: 24 mmol/L (ref 22–32)
Calcium: 9.8 mg/dL (ref 8.9–10.3)
Chloride: 106 mmol/L (ref 98–111)
Creatinine: 1.33 mg/dL — ABNORMAL HIGH (ref 0.61–1.24)
GFR, Est AFR Am: 57 mL/min — ABNORMAL LOW (ref >60)
GFR, Estimated: 49 mL/min — ABNORMAL LOW (ref >60)
Glucose, Bld: 124 mg/dL — ABNORMAL HIGH (ref 70–99)
Potassium: 3.7 mmol/L (ref 3.5–5.1)
Sodium: 140 mmol/L (ref 135–145)
Total Bilirubin: 0.6 mg/dL (ref 0.3–1.2)
Total Protein: 7.7 g/dL (ref 6.5–8.1)

## 2018-11-28 LAB — FERRITIN: Ferritin: 124 ng/mL (ref 24–336)

## 2018-11-28 LAB — IRON AND TIBC
Iron: 122 ug/dL (ref 42–163)
Saturation Ratios: 41 % (ref 20–55)
TIBC: 299 ug/dL (ref 202–409)
UIBC: 177 ug/dL (ref 117–376)

## 2018-11-29 LAB — MULTIPLE MYELOMA PANEL, SERUM
Albumin SerPl Elph-Mcnc: 4.1 g/dL (ref 2.9–4.4)
Albumin/Glob SerPl: 1.5 (ref 0.7–1.7)
Alpha 1: 0.3 g/dL (ref 0.0–0.4)
Alpha2 Glob SerPl Elph-Mcnc: 0.9 g/dL (ref 0.4–1.0)
B-Globulin SerPl Elph-Mcnc: 0.7 g/dL (ref 0.7–1.3)
Gamma Glob SerPl Elph-Mcnc: 1 g/dL (ref 0.4–1.8)
Globulin, Total: 2.8 g/dL (ref 2.2–3.9)
IgA: 117 mg/dL (ref 61–437)
IgG (Immunoglobin G), Serum: 1090 mg/dL (ref 603–1613)
IgM (Immunoglobulin M), Srm: 53 mg/dL (ref 15–143)
Total Protein ELP: 6.9 g/dL (ref 6.0–8.5)

## 2018-12-05 ENCOUNTER — Telehealth: Payer: Self-pay

## 2018-12-05 NOTE — Telephone Encounter (Signed)
-----   Message from Truitt Merle, MD sent at 12/04/2018  9:56 AM EDT ----- Please let pt know his lab results, plt better, but still higher than 500K, I would like him to increase Hydrea to 1000mg  daily on Mondays and Fridays, 500mg  daily for the rest of week. His Cr was also slightly higher than before, make sure he is well hydrated, and f/u with PCP. No other concerns, thanks   Truitt Merle  12/04/2018

## 2018-12-05 NOTE — Telephone Encounter (Signed)
Spoke with patient regarding lab results.  Per Dr. Burr Medico platelets are better but still higher than 500K.  He states he is currently taking Hydrea 1000 mg on Mondays and Fridays and 500 mg on the other days.  I explained I will speak with Dr. Burr Medico and let him know if he needs to increase it on another day of the week.  He verbalized an understanding.

## 2018-12-07 ENCOUNTER — Telehealth: Payer: Self-pay

## 2018-12-07 IMAGING — DX DG CHEST 2V
2 series · 2 of 2 positions shown · non-contrast
Comparison: 12/07/2016

CLINICAL DATA: Productive cough for 2 months

EXAM:
CHEST - 2 VIEW

[chest pa]
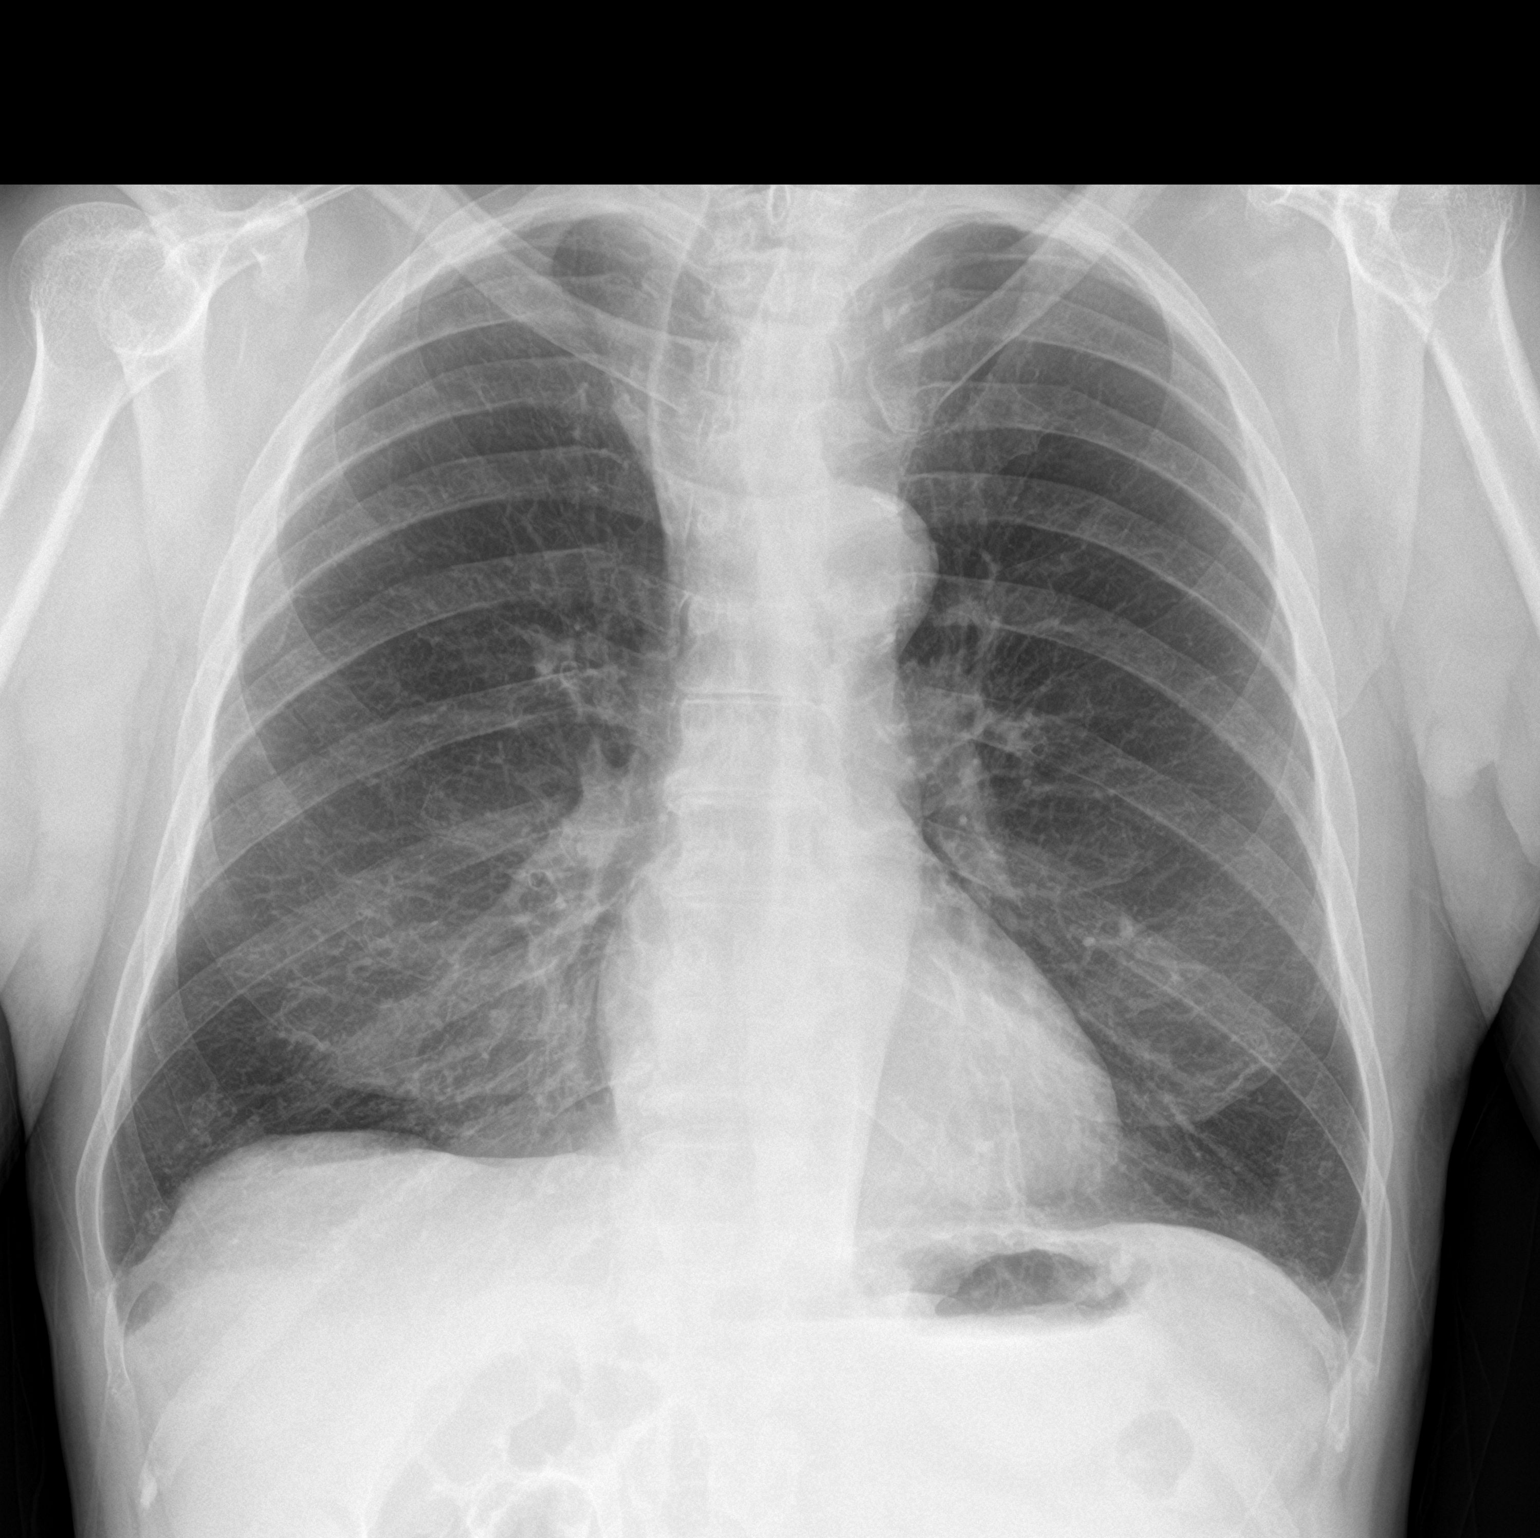

[chest lat]
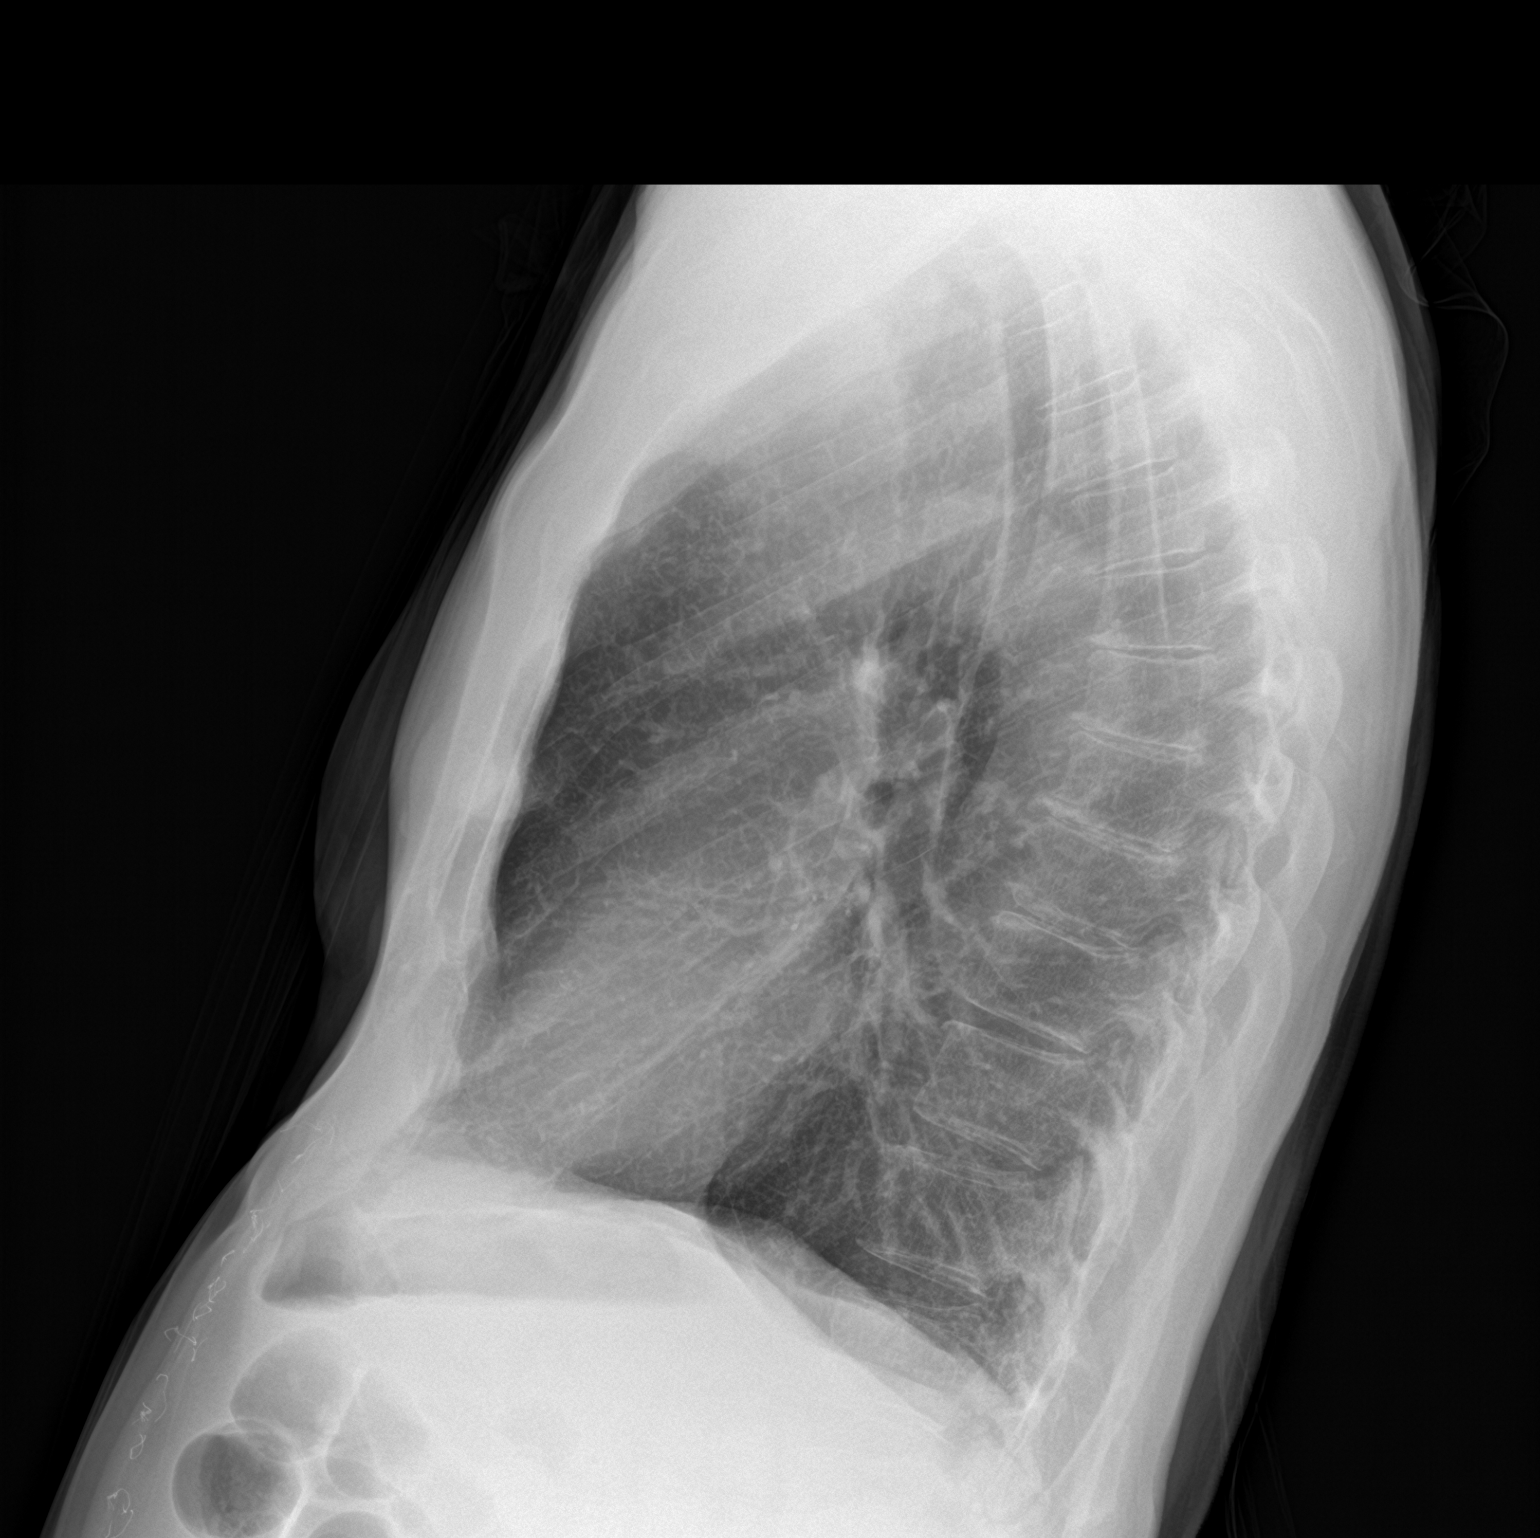

[2 of 2 positions shown; findings below may reference images not displayed]

FINDINGS: Cardiac shadow is within normal limits. Aortic calcifications are
again seen. Lungs are hyperinflated without focal infiltrate or
sizable effusion. No acute bony abnormality is seen.
IMPRESSION: COPD without acute abnormality.

## 2018-12-07 NOTE — Telephone Encounter (Signed)
Spoke with patient regarding Hydrea, per Dr. Burr Medico instructed to increase Hydrea to 1000 mg on Mondays, Wednesday, Fridays and 500 mg on all other days.  He verbalized an understanding.

## 2019-01-15 NOTE — Progress Notes (Signed)
[place]   [phone] Fax:(336) [phone]   Clinic Follow up Note   Patient Care Team: Wenda Low, MD as PCP - General (Internal Medicine) Nobie Putnam, MD (Hematology and Oncology) Garlan Fair, MD as Attending Physician (Gastroenterology) Rana Snare, MD as Attending Physician (Urology) 01/16/2019  CHIEF COMPLAINT: f/u essential thrombocytosis   PREVIOUS THERAPY:Hydrea 500 mg daily, from 08/2012 to 04/01/2015, stopped on 04/02/2015 due to recurrent episodes of dizziness, restarted on 06/08/2015. He tried anagrelide 0.5mg  bid for one month in 05/2015 which was not effective. Startled Hydrea 500 mg Monday-Saturday, hold on sundays due to anemia on 07/19/17, and decreased to 500mg  daily except Wednesday and Sunday on 12/06/2017, increased back to 500mg  daily except on Sundays in 01/2018  CURRENT THERAPY: hydrea 500mg  daily except Sundays. Due to elevated PLT ct will increase to 500mg  daily starting 06/20/18. Will increase to 1000mg  on mondays and 500mg  the rest of the week starting 10/17/18. Increased hydrea to 1000 mg MWF and 500 mg other days due to plt count >500k  INTERVAL HISTORY: Ricky Santos returns for f/u as scheduled. He was last seen 10/17/2018.  He feels good in general, denies specific concerns today.  He continues Hydrea 1000 mg Monday wednesday Friday and 500 mg every other day of the week.  He takes iron 1 tab daily.  His appetite is normal although he admits to eating less often.  His fatigue is at baseline.  He is the primary caregiver for his wife who is now on hospice care.  She is somewhat lucid, sleeping most of the time.  Hospice caregiver comes once daily.  He is at peace with his wife's condition.  Ricky Santos denies fever, chills, cough, chest pain, dyspnea, swelling, N/V/V/D, bleeding, dizziness, or pain.   MEDICAL HISTORY:  Past Medical History:  Diagnosis Date  . Back pain   . Essential thrombocytosis ([place]) 07/2012   JAK-2 mutation  08/15/2012 positive; BCR/ABL negative.   . Essential thrombocytosis ([place]) 08/26/2012  . GERD (gastroesophageal reflux disease)   . Hernia   . Hyperlipidemia   . Hypertension   . Hypothyroid   . Prostate cancer [facility])     SURGICAL HISTORY: Past Surgical History:  Procedure Laterality Date  . BACK SURGERY    . COLONOSCOPY  2003  . COLONOSCOPY N/A 01/17/2013   Procedure: COLONOSCOPY;  Surgeon: Garlan Fair, MD;  Location: [facility];  Service: Endoscopy;  Laterality: N/A;  . exp lap for trauma    . HERNIA REPAIR    . INCISION AND DRAINAGE ABSCESS ANAL  08/01/05  . PROSTATE SURGERY    . SHOULDER SURGERY     rt    I have reviewed the social history and family history with the patient and they are unchanged from previous note.  ALLERGIES:  has No Known Allergies.  MEDICATIONS:  Current Outpatient Medications  Medication Sig Dispense Refill  . aspirin 81 MG tablet Take 81 mg by mouth daily.      Marland Kitchen atorvastatin (LIPITOR) 40 MG tablet Take 40 mg by mouth daily.      Marland Kitchen diltiazem (CARDIZEM CD) 240 MG 24 hr capsule Take 240 mg by mouth daily.      . finasteride (PROSCAR) 5 MG tablet Take 5 mg by mouth daily.      . hydrochlorothiazide 25 MG tablet Take 25 mg by mouth daily.      . hydroxyurea (HYDREA) 500 MG capsule Take 1 capsule (500 mg total) by mouth daily.  Take 2 capsules daily on Mon, Wed, Fri, and 1 capsule daily for the rest of week 100 capsule 0  . levothyroxine (SYNTHROID, LEVOTHROID) 25 MCG tablet Take 25 mcg by mouth daily.      Marland Kitchen lisinopril (PRINIVIL,ZESTRIL) 20 MG tablet Take 20 mg by mouth daily.  6  . meclizine (ANTIVERT) 25 MG tablet Take 25 mg by mouth as needed. Reported on 11/11/2015  3  . mirtazapine (REMERON) 15 MG tablet Take 15 mg by mouth at bedtime.     . pantoprazole (PROTONIX) 40 MG tablet Take 40 mg by mouth daily.      . psyllium (METAMUCIL) 58.6 % powder Take 1 packet by mouth 3 (three) times daily. Reported on 11/11/2015     No current  facility-administered medications for this visit.     PHYSICAL EXAMINATION: ECOG PERFORMANCE STATUS: 0 - Asymptomatic  Vitals:   01/16/19 1311  BP: 130/77  Pulse: 81  Resp: 18  Temp: 98.9 F (37.2 C)  SpO2: 100%   Filed Weights   01/16/19 1311  Weight: 147 lb 3.2 oz (66.8 kg)    GENERAL:alert, no distress and comfortable SKIN: no rash EYES:  sclera clear LYMPH:  no palpable cervical or supraclavicular lymphadenopathy LUNGS: clear to auscultation with normal breathing effort HEART: regular rate & rhythm, no lower extremity edema ABDOMEN:abdomen soft, non-tender and normal bowel sounds.  No hepatomegaly Musculoskeletal:no cyanosis of digits  NEURO: alert & oriented x 3 with fluent speech, normal gait  LABORATORY DATA:  I have reviewed the data as listed CBC Latest Ref Rng & Units 01/16/2019 11/28/2018 10/17/2018  WBC 4.0 - 10.5 K/uL 5.9 6.1 5.3  Hemoglobin 13.0 - 17.0 g/dL 12.9(L) 13.6 13.3  Hematocrit 39.0 - 52.0 % 38.3(L) 41.6 41.1  Platelets 150 - 400 K/uL 404(H) 519(H) 540(H)     CMP Latest Ref Rng & Units 01/16/2019 11/28/2018 10/17/2018  Glucose 70 - 99 mg/dL 73 124(H) 91  BUN 8 - 23 mg/dL 19 16 19   Creatinine 0.61 - 1.24 mg/dL 1.17 1.33(H) 1.28(H)  Sodium 135 - 145 mmol/L 138 140 140  Potassium 3.5 - 5.1 mmol/L 3.9 3.7 3.9  Chloride 98 - 111 mmol/L 106 106 106  CO2 22 - 32 mmol/L 22 24 23   Calcium 8.9 - 10.3 mg/dL 10.0 9.8 9.4  Total Protein 6.5 - 8.1 g/dL 7.2 7.7 7.4  Total Bilirubin 0.3 - 1.2 mg/dL 0.6 0.6 0.3  Alkaline Phos 38 - 126 U/L 53 60 66  AST 15 - 41 U/L 22 21 19   ALT 0 - 44 U/L 13 12 9       RADIOGRAPHIC STUDIES: I have personally reviewed the radiological images as listed and agreed with the findings in the report. No results found.   ASSESSMENT & PLAN: 85 y.o.malewith   1. Essential Thrombocytosis, JAK2(+) -diagnosed in 07/2012, initial platelet count in 500K range; JAK2(+) -on Hydrea titrated up to maintain plt count <500K  2. MGUS  -M spike 0.2 with IgG monoclonal protein with lambda light chain specificity on 12/06/17.   -bone survey 01/24/2018 negative for lytic lesions   Dispo: Ricky Santos appears well.  He continues Hydrea 1000 mg Monday Wednesday Friday and 500 mg Tuesday Thursday Saturday Sunday since 12/07/18.  He is tolerating well without appreciable side effects.  Platelet count 404K today, continue Hydrea at current dose.  Refilled today. CMP normal. I encouraged him to continue adequate hydration and to take care of himself while caring for his wife. Hgb 12.9; iron  and ferritin in normal range. He takes 1 iron tab daily. Last MM Panel 11/28/18 M protein was not detected. He will return for lab in 6 weeks, then lab and f/u with Dr. Burr Medico in 3 months.   All questions were answered. The patient knows to call the clinic with any problems, questions or concerns. No barriers to learning was detected.     Alla Feeling, NP 01/16/19

## 2019-01-16 ENCOUNTER — Encounter: Payer: Self-pay | Admitting: Nurse Practitioner

## 2019-01-16 ENCOUNTER — Telehealth: Payer: Self-pay | Admitting: Nurse Practitioner

## 2019-01-16 ENCOUNTER — Other Ambulatory Visit: Payer: Self-pay

## 2019-01-16 ENCOUNTER — Inpatient Hospital Stay (HOSPITAL_BASED_OUTPATIENT_CLINIC_OR_DEPARTMENT_OTHER): Payer: Medicare PPO | Admitting: Nurse Practitioner

## 2019-01-16 ENCOUNTER — Inpatient Hospital Stay: Payer: Medicare PPO | Attending: Hematology

## 2019-01-16 DIAGNOSIS — E039 Hypothyroidism, unspecified: Secondary | ICD-10-CM | POA: Diagnosis not present

## 2019-01-16 DIAGNOSIS — K219 Gastro-esophageal reflux disease without esophagitis: Secondary | ICD-10-CM | POA: Diagnosis not present

## 2019-01-16 DIAGNOSIS — R5383 Other fatigue: Secondary | ICD-10-CM | POA: Insufficient documentation

## 2019-01-16 DIAGNOSIS — Z8673 Personal history of transient ischemic attack (TIA), and cerebral infarction without residual deficits: Secondary | ICD-10-CM | POA: Insufficient documentation

## 2019-01-16 DIAGNOSIS — Z7982 Long term (current) use of aspirin: Secondary | ICD-10-CM | POA: Insufficient documentation

## 2019-01-16 DIAGNOSIS — D473 Essential (hemorrhagic) thrombocythemia: Secondary | ICD-10-CM | POA: Insufficient documentation

## 2019-01-16 DIAGNOSIS — I10 Essential (primary) hypertension: Secondary | ICD-10-CM | POA: Diagnosis not present

## 2019-01-16 DIAGNOSIS — E785 Hyperlipidemia, unspecified: Secondary | ICD-10-CM | POA: Diagnosis not present

## 2019-01-16 DIAGNOSIS — D509 Iron deficiency anemia, unspecified: Secondary | ICD-10-CM

## 2019-01-16 DIAGNOSIS — D472 Monoclonal gammopathy: Secondary | ICD-10-CM | POA: Diagnosis not present

## 2019-01-16 DIAGNOSIS — Z79899 Other long term (current) drug therapy: Secondary | ICD-10-CM | POA: Insufficient documentation

## 2019-01-16 LAB — COMPREHENSIVE METABOLIC PANEL
ALT: 13 U/L (ref 0–44)
AST: 22 U/L (ref 15–41)
Albumin: 4.1 g/dL (ref 3.5–5.0)
Alkaline Phosphatase: 53 U/L (ref 38–126)
Anion gap: 10 (ref 5–15)
BUN: 19 mg/dL (ref 8–23)
CO2: 22 mmol/L (ref 22–32)
Calcium: 10 mg/dL (ref 8.9–10.3)
Chloride: 106 mmol/L (ref 98–111)
Creatinine, Ser: 1.17 mg/dL (ref 0.61–1.24)
GFR calc Af Amer: >60 mL/min (ref >60)
GFR calc non Af Amer: 57 mL/min — ABNORMAL LOW (ref >60)
Glucose, Bld: 73 mg/dL (ref 70–99)
Potassium: 3.9 mmol/L (ref 3.5–5.1)
Sodium: 138 mmol/L (ref 135–145)
Total Bilirubin: 0.6 mg/dL (ref 0.3–1.2)
Total Protein: 7.2 g/dL (ref 6.5–8.1)

## 2019-01-16 LAB — CBC WITH DIFFERENTIAL/PLATELET
Abs Immature Granulocytes: 0.01 10*3/uL (ref 0.00–0.07)
Basophils Absolute: 0.1 10*3/uL (ref 0.0–0.1)
Basophils Relative: 1 %
Eosinophils Absolute: 0.2 10*3/uL (ref 0.0–0.5)
Eosinophils Relative: 3 %
HCT: 38.3 % — ABNORMAL LOW (ref 39.0–52.0)
Hemoglobin: 12.9 g/dL — ABNORMAL LOW (ref 13.0–17.0)
Immature Granulocytes: 0 %
Lymphocytes Relative: 24 %
Lymphs Abs: 1.4 10*3/uL (ref 0.7–4.0)
MCH: 34.7 pg — ABNORMAL HIGH (ref 26.0–34.0)
MCHC: 33.7 g/dL (ref 30.0–36.0)
MCV: 103 fL — ABNORMAL HIGH (ref 80.0–100.0)
Monocytes Absolute: 0.5 10*3/uL (ref 0.1–1.0)
Monocytes Relative: 9 %
Neutro Abs: 3.8 10*3/uL (ref 1.7–7.7)
Neutrophils Relative %: 63 %
Platelets: 404 10*3/uL — ABNORMAL HIGH (ref 150–400)
RBC: 3.72 MIL/uL — ABNORMAL LOW (ref 4.22–5.81)
RDW: 14.2 % (ref 11.5–15.5)
WBC: 5.9 10*3/uL (ref 4.0–10.5)
nRBC: 0 % (ref 0.0–0.2)

## 2019-01-16 LAB — IRON AND TIBC
Iron: 104 ug/dL (ref 42–163)
Saturation Ratios: 35 % (ref 20–55)
TIBC: 293 ug/dL (ref 202–409)
UIBC: 189 ug/dL (ref 117–376)

## 2019-01-16 LAB — FERRITIN: Ferritin: 107 ng/mL (ref 24–336)

## 2019-01-16 MED ORDER — HYDROXYUREA 500 MG PO CAPS: 500.0000 mg | ORAL_CAPSULE | Freq: Every day | ORAL | 0 refills | Status: DC

## 2019-01-16 NOTE — Telephone Encounter (Signed)
Scheduled appt per 8/17 los.  Spoke with patient and he is aware of his appt date and time.

## 2019-01-23 ENCOUNTER — Telehealth: Payer: Self-pay

## 2019-01-23 ENCOUNTER — Encounter: Payer: Self-pay | Admitting: *Deleted

## 2019-01-23 NOTE — Progress Notes (Signed)
[place] Work  Clinical Social Work received referral for completing advance directives.  CSW contacted patient at home to offer support.  Patient stated he had the Advance directives/Power of Attorney packet but "need help completing".  CSW and patient are scheduled for a phone appointment on 8/31 to review documents.  CSW explained that due to covid restrictions and lack of witnesses, document could not be notarized at [facility].  Patient verbalized understanding.    Johnnye Lana, MSW, LCSW, OSW-C Clinical Social Worker [facility] [phone]

## 2019-01-23 NOTE — Telephone Encounter (Signed)
Received on call message that patient is requesting information on obtaining POA. Sent message to social work to follow up with patient regarding request.

## 2019-02-01 ENCOUNTER — Other Ambulatory Visit: Payer: Self-pay

## 2019-02-01 DIAGNOSIS — D473 Essential (hemorrhagic) thrombocythemia: Secondary | ICD-10-CM

## 2019-02-01 MED ORDER — HYDROXYUREA 500 MG PO CAPS: 500.0000 mg | ORAL_CAPSULE | Freq: Every day | ORAL | 0 refills | Status: DC

## 2019-02-08 ENCOUNTER — Other Ambulatory Visit: Payer: Self-pay | Admitting: Hematology

## 2019-02-08 DIAGNOSIS — D473 Essential (hemorrhagic) thrombocythemia: Secondary | ICD-10-CM

## 2019-02-08 MED ORDER — HYDROXYUREA 500 MG PO CAPS: 500.0000 mg | ORAL_CAPSULE | Freq: Every day | ORAL | 5 refills | Status: DC

## 2019-02-13 DIAGNOSIS — C61 Malignant neoplasm of prostate: Secondary | ICD-10-CM | POA: Diagnosis not present

## 2019-02-13 DIAGNOSIS — J440 Chronic obstructive pulmonary disease with acute lower respiratory infection: Secondary | ICD-10-CM | POA: Diagnosis not present

## 2019-02-13 DIAGNOSIS — E782 Mixed hyperlipidemia: Secondary | ICD-10-CM | POA: Diagnosis not present

## 2019-02-13 DIAGNOSIS — D473 Essential (hemorrhagic) thrombocythemia: Secondary | ICD-10-CM | POA: Diagnosis not present

## 2019-02-13 DIAGNOSIS — G629 Polyneuropathy, unspecified: Secondary | ICD-10-CM | POA: Diagnosis not present

## 2019-02-13 DIAGNOSIS — I10 Essential (primary) hypertension: Secondary | ICD-10-CM | POA: Diagnosis not present

## 2019-02-13 DIAGNOSIS — D472 Monoclonal gammopathy: Secondary | ICD-10-CM | POA: Diagnosis not present

## 2019-02-13 DIAGNOSIS — K219 Gastro-esophageal reflux disease without esophagitis: Secondary | ICD-10-CM | POA: Diagnosis not present

## 2019-02-14 ENCOUNTER — Other Ambulatory Visit: Payer: Self-pay

## 2019-02-14 ENCOUNTER — Telehealth: Payer: Self-pay

## 2019-02-14 DIAGNOSIS — D473 Essential (hemorrhagic) thrombocythemia: Secondary | ICD-10-CM

## 2019-02-14 MED ORDER — HYDROXYUREA 500 MG PO CAPS: 500.0000 mg | ORAL_CAPSULE | Freq: Every day | ORAL | 5 refills | Status: DC

## 2019-02-14 NOTE — Telephone Encounter (Signed)
Sent in new script for Hydrea with clarification of dosing, 2 capsules daily on Monday, Wednesday, Friday and one on all other days of the week.  This was sent into [facility]

## 2019-02-27 ENCOUNTER — Inpatient Hospital Stay: Payer: Medicare PPO

## 2019-02-28 ENCOUNTER — Other Ambulatory Visit: Payer: Self-pay

## 2019-02-28 ENCOUNTER — Telehealth: Payer: Self-pay

## 2019-02-28 ENCOUNTER — Inpatient Hospital Stay: Payer: Medicare PPO | Attending: Hematology

## 2019-02-28 DIAGNOSIS — D509 Iron deficiency anemia, unspecified: Secondary | ICD-10-CM

## 2019-02-28 DIAGNOSIS — D473 Essential (hemorrhagic) thrombocythemia: Secondary | ICD-10-CM

## 2019-02-28 DIAGNOSIS — D472 Monoclonal gammopathy: Secondary | ICD-10-CM

## 2019-02-28 LAB — CBC WITH DIFFERENTIAL/PLATELET
Abs Immature Granulocytes: 0.01 10*3/uL (ref 0.00–0.07)
Basophils Absolute: 0.1 10*3/uL (ref 0.0–0.1)
Basophils Relative: 1 %
Eosinophils Absolute: 0.2 10*3/uL (ref 0.0–0.5)
Eosinophils Relative: 3 %
HCT: 37.3 % — ABNORMAL LOW (ref 39.0–52.0)
Hemoglobin: 12.7 g/dL — ABNORMAL LOW (ref 13.0–17.0)
Immature Granulocytes: 0 %
Lymphocytes Relative: 19 %
Lymphs Abs: 1.4 10*3/uL (ref 0.7–4.0)
MCH: 34.7 pg — ABNORMAL HIGH (ref 26.0–34.0)
MCHC: 34 g/dL (ref 30.0–36.0)
MCV: 101.9 fL — ABNORMAL HIGH (ref 80.0–100.0)
Monocytes Absolute: 0.5 10*3/uL (ref 0.1–1.0)
Monocytes Relative: 7 %
Neutro Abs: 5 10*3/uL (ref 1.7–7.7)
Neutrophils Relative %: 70 %
Platelets: 408 10*3/uL — ABNORMAL HIGH (ref 150–400)
RBC: 3.66 MIL/uL — ABNORMAL LOW (ref 4.22–5.81)
RDW: 13.2 % (ref 11.5–15.5)
WBC: 7.1 10*3/uL (ref 4.0–10.5)
nRBC: 0 % (ref 0.0–0.2)

## 2019-02-28 LAB — CMP (CANCER CENTER ONLY)
ALT: 12 U/L (ref 0–44)
AST: 23 U/L (ref 15–41)
Albumin: 4.1 g/dL (ref 3.5–5.0)
Alkaline Phosphatase: 56 U/L (ref 38–126)
Anion gap: 10 (ref 5–15)
BUN: 12 mg/dL (ref 8–23)
CO2: 21 mmol/L — ABNORMAL LOW (ref 22–32)
Calcium: 9.4 mg/dL (ref 8.9–10.3)
Chloride: 108 mmol/L (ref 98–111)
Creatinine: 1.02 mg/dL (ref 0.61–1.24)
GFR, Est AFR Am: >60 mL/min (ref >60)
GFR, Estimated: >60 mL/min (ref >60)
Glucose, Bld: 83 mg/dL (ref 70–99)
Potassium: 4.1 mmol/L (ref 3.5–5.1)
Sodium: 139 mmol/L (ref 135–145)
Total Bilirubin: 0.6 mg/dL (ref 0.3–1.2)
Total Protein: 6.8 g/dL (ref 6.5–8.1)

## 2019-02-28 LAB — IRON AND TIBC
Iron: 153 ug/dL (ref 42–163)
Saturation Ratios: 50 % (ref 20–55)
TIBC: 305 ug/dL (ref 202–409)
UIBC: 152 ug/dL (ref 117–376)

## 2019-02-28 LAB — FERRITIN: Ferritin: 69 ng/mL (ref 24–336)

## 2019-02-28 NOTE — Telephone Encounter (Signed)
TC to pt per Cira Rue NP to let him know that his platelet count and labs are stable. Continue hydrea. Patient confirmed that he is taking his current dose as:  Hydrea 1000 mg Monday Wednesday Friday and 500 mg Tuesday Thursday Saturday Sunday. Pt stated that he did not need a refill. No further problems or concerns at this time

## 2019-03-02 ENCOUNTER — Telehealth: Payer: Self-pay

## 2019-03-02 NOTE — Telephone Encounter (Signed)
Spoke with patient regarding lab results.  Per Dr. Burr Medico informed him platelets are controlled, instructed to continue current dose of hydrea, no other concerns.  Patient verbalized an understanding.

## 2019-03-02 NOTE — Telephone Encounter (Signed)
-----   Message from Truitt Merle, MD sent at 03/02/2019  3:18 PM EDT ----- Let pt know his lab results, plt is controlled, continue current dose hydrea, no other concerns, thanks   Truitt Merle  03/02/2019

## 2019-04-14 DIAGNOSIS — N401 Enlarged prostate with lower urinary tract symptoms: Secondary | ICD-10-CM | POA: Diagnosis not present

## 2019-04-14 DIAGNOSIS — R350 Frequency of micturition: Secondary | ICD-10-CM | POA: Diagnosis not present

## 2019-04-19 ENCOUNTER — Inpatient Hospital Stay: Payer: Medicare PPO | Attending: Hematology

## 2019-04-19 ENCOUNTER — Encounter: Payer: Self-pay | Admitting: Hematology

## 2019-04-19 ENCOUNTER — Inpatient Hospital Stay (HOSPITAL_BASED_OUTPATIENT_CLINIC_OR_DEPARTMENT_OTHER): Payer: Medicare PPO | Admitting: Hematology

## 2019-04-19 ENCOUNTER — Other Ambulatory Visit: Payer: Self-pay

## 2019-04-19 VITALS — BP 156/86 | HR 72 | Temp 97.8°F | Resp 20 | Ht 69.0 in | Wt 152.9 lb

## 2019-04-19 DIAGNOSIS — R42 Dizziness and giddiness: Secondary | ICD-10-CM | POA: Diagnosis not present

## 2019-04-19 DIAGNOSIS — E039 Hypothyroidism, unspecified: Secondary | ICD-10-CM | POA: Insufficient documentation

## 2019-04-19 DIAGNOSIS — D509 Iron deficiency anemia, unspecified: Secondary | ICD-10-CM | POA: Diagnosis not present

## 2019-04-19 DIAGNOSIS — I10 Essential (primary) hypertension: Secondary | ICD-10-CM | POA: Diagnosis not present

## 2019-04-19 DIAGNOSIS — D473 Essential (hemorrhagic) thrombocythemia: Secondary | ICD-10-CM

## 2019-04-19 DIAGNOSIS — E785 Hyperlipidemia, unspecified: Secondary | ICD-10-CM | POA: Diagnosis not present

## 2019-04-19 DIAGNOSIS — Z8546 Personal history of malignant neoplasm of prostate: Secondary | ICD-10-CM | POA: Diagnosis not present

## 2019-04-19 DIAGNOSIS — Z79899 Other long term (current) drug therapy: Secondary | ICD-10-CM | POA: Diagnosis not present

## 2019-04-19 DIAGNOSIS — K219 Gastro-esophageal reflux disease without esophagitis: Secondary | ICD-10-CM | POA: Diagnosis not present

## 2019-04-19 DIAGNOSIS — D472 Monoclonal gammopathy: Secondary | ICD-10-CM

## 2019-04-19 DIAGNOSIS — Z7982 Long term (current) use of aspirin: Secondary | ICD-10-CM | POA: Insufficient documentation

## 2019-04-19 LAB — CBC WITH DIFFERENTIAL/PLATELET
Abs Immature Granulocytes: 0.03 10*3/uL (ref 0.00–0.07)
Basophils Absolute: 0.1 10*3/uL (ref 0.0–0.1)
Basophils Relative: 2 %
Eosinophils Absolute: 0.3 10*3/uL (ref 0.0–0.5)
Eosinophils Relative: 4 %
HCT: 44.2 % (ref 39.0–52.0)
Hemoglobin: 14.3 g/dL (ref 13.0–17.0)
Immature Granulocytes: 0 %
Lymphocytes Relative: 33 %
Lymphs Abs: 2.4 10*3/uL (ref 0.7–4.0)
MCH: 33.5 pg (ref 26.0–34.0)
MCHC: 32.4 g/dL (ref 30.0–36.0)
MCV: 103.5 fL — ABNORMAL HIGH (ref 80.0–100.0)
Monocytes Absolute: 0.6 10*3/uL (ref 0.1–1.0)
Monocytes Relative: 8 %
Neutro Abs: 4 10*3/uL (ref 1.7–7.7)
Neutrophils Relative %: 53 %
Platelets: 422 10*3/uL — ABNORMAL HIGH (ref 150–400)
RBC: 4.27 MIL/uL (ref 4.22–5.81)
RDW: 13.2 % (ref 11.5–15.5)
WBC: 7.5 10*3/uL (ref 4.0–10.5)
nRBC: 0 % (ref 0.0–0.2)

## 2019-04-19 LAB — CMP (CANCER CENTER ONLY)
ALT: 12 U/L (ref 0–44)
AST: 23 U/L (ref 15–41)
Albumin: 4.2 g/dL (ref 3.5–5.0)
Alkaline Phosphatase: 64 U/L (ref 38–126)
Anion gap: 13 (ref 5–15)
BUN: 18 mg/dL (ref 8–23)
CO2: 21 mmol/L — ABNORMAL LOW (ref 22–32)
Calcium: 9.6 mg/dL (ref 8.9–10.3)
Chloride: 106 mmol/L (ref 98–111)
Creatinine: 1.2 mg/dL (ref 0.61–1.24)
GFR, Est AFR Am: >60 mL/min (ref >60)
GFR, Estimated: 56 mL/min — ABNORMAL LOW (ref >60)
Glucose, Bld: 68 mg/dL — ABNORMAL LOW (ref 70–99)
Potassium: 4.2 mmol/L (ref 3.5–5.1)
Sodium: 140 mmol/L (ref 135–145)
Total Bilirubin: 0.4 mg/dL (ref 0.3–1.2)
Total Protein: 7.7 g/dL (ref 6.5–8.1)

## 2019-04-19 LAB — IRON AND TIBC
Iron: 228 ug/dL — ABNORMAL HIGH (ref 42–163)
Saturation Ratios: 72 % — ABNORMAL HIGH (ref 20–55)
TIBC: 315 ug/dL (ref 202–409)
UIBC: 87 ug/dL — ABNORMAL LOW (ref 117–376)

## 2019-04-19 LAB — FERRITIN: Ferritin: 62 ng/mL (ref 24–336)

## 2019-04-19 NOTE — Progress Notes (Signed)
[place]   Telephone:(336) [phone] Fax:(336) [phone]   Clinic Follow up Note   Patient Care Team: Wenda Low, MD as PCP - General (Internal Medicine) Nobie Putnam, MD (Hematology and Oncology) Garlan Fair, MD as Attending Physician (Gastroenterology) Rana Snare, MD as Attending Physician (Urology)  Date of Service:  04/19/2019  CHIEF COMPLAINT: Follow up on essential thrombocytosis  PREVIOUS THERAPY:Hydrea 500 mg daily, from 08/2012 to 04/01/2015, stopped on 04/02/2015 due to recurrent episodes of dizziness, restarted on 06/08/2015. He tried anagrelide 0.'5mg'$  bid for one month in 05/2015 which was not effective. Startled Hydrea 500 mg Monday-Saturday, hold on sundays due to anemia on 07/19/17, and decreased to '500mg'$  daily except Wednesday and Sunday on 12/06/2017, increased back to '500mg'$  daily except on Sundays in 01/2018. Dose further increased subsequently   CURRENT THERAPY: Increased hydrea to 1000 mg MWF and 500 mg other days due to plt count >500k in July 2020   INTERVAL HISTORY:  Ricky Santos is here for a follow up of ET. He presents to the clinic alone. He notes he is doing well. He is taking Hydrea '1000mg'$  MWF and '500mg'$  the rest of the week. He notes he is tolerating well. He overall has not current complaints.    REVIEW OF SYSTEMS:   Constitutional: Denies fevers, chills or abnormal weight loss Eyes: Denies blurriness of vision Ears, nose, mouth, throat, and face: Denies mucositis or sore throat Respiratory: Denies cough, dyspnea or wheezes Cardiovascular: Denies palpitation, chest discomfort or lower extremity swelling Gastrointestinal:  Denies nausea, heartburn or change in bowel habits Skin: Denies abnormal skin rashes Lymphatics: Denies new lymphadenopathy or easy bruising Neurological:Denies numbness, tingling or new weaknesses Behavioral/Psych: Mood is stable, no new changes  All other systems were reviewed with the patient and are negative.   MEDICAL HISTORY:  Past Medical History:  Diagnosis Date  . Back pain   . Essential thrombocytosis ([place]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   . Essential thrombocytosis ([place]) 08/26/2012  . GERD (gastroesophageal reflux disease)   . Hernia   . Hyperlipidemia   . Hypertension   . Hypothyroid   . Prostate cancer [facility])     SURGICAL HISTORY: Past Surgical History:  Procedure Laterality Date  . BACK SURGERY    . COLONOSCOPY  2003  . COLONOSCOPY N/A 01/17/2013   Procedure: COLONOSCOPY;  Surgeon: Garlan Fair, MD;  Location: [facility];  Service: Endoscopy;  Laterality: N/A;  . exp lap for trauma    . HERNIA REPAIR    . INCISION AND DRAINAGE ABSCESS ANAL  08/01/05  . PROSTATE SURGERY    . SHOULDER SURGERY     rt    I have reviewed the social history and family history with the patient and they are unchanged from previous note.  ALLERGIES:  has No Known Allergies.  MEDICATIONS:  Current Outpatient Medications  Medication Sig Dispense Refill  . aspirin 81 MG tablet Take 81 mg by mouth daily.      Marland Kitchen atorvastatin (LIPITOR) 40 MG tablet Take 40 mg by mouth daily.      Marland Kitchen diltiazem (CARDIZEM CD) 240 MG 24 hr capsule Take 240 mg by mouth daily.      . finasteride (PROSCAR) 5 MG tablet Take 5 mg by mouth daily.      . hydrochlorothiazide 25 MG tablet Take 25 mg by mouth daily.      . hydroxyurea (HYDREA) 500 MG capsule Take 1 capsule (500 mg  total) by mouth daily. Take 2 capsules daily on Mon, Wed, Fri, and 1 capsule daily for the rest of week 100 capsule 5  . levothyroxine (SYNTHROID, LEVOTHROID) 25 MCG tablet Take 25 mcg by mouth daily.      Marland Kitchen lisinopril (PRINIVIL,ZESTRIL) 20 MG tablet Take 20 mg by mouth daily.  6  . meclizine (ANTIVERT) 25 MG tablet Take 25 mg by mouth as needed. Reported on 11/11/2015  3  . mirtazapine (REMERON) 15 MG tablet Take 15 mg by mouth at bedtime.     . pantoprazole (PROTONIX) 40 MG tablet Take 40 mg by mouth daily.      .  psyllium (METAMUCIL) 58.6 % powder Take 1 packet by mouth 3 (three) times daily. Reported on 11/11/2015     No current facility-administered medications for this visit.     PHYSICAL EXAMINATION: ECOG PERFORMANCE STATUS: 1 - Symptomatic but completely ambulatory  Vitals:   04/19/19 1419  BP: (!) 156/86  Pulse: 72  Resp: 20  Temp: 97.8 F (36.6 C)  SpO2: 100%   Filed Weights   04/19/19 1419  Weight: 152 lb 14.4 oz (69.4 kg)    GENERAL:alert, no distress and comfortable SKIN: skin color, texture, turgor are normal, no rashes or significant lesions EYES: normal, Conjunctiva are pink and non-injected, sclera clear  NECK: supple, thyroid normal size, non-tender, without nodularity LYMPH:  no palpable lymphadenopathy in the cervical, axillary  LUNGS: clear to auscultation and percussion with normal breathing effort HEART: regular rate & rhythm and no murmurs and no lower extremity edema ABDOMEN:abdomen soft, non-tender and normal bowel sounds Musculoskeletal:no cyanosis of digits and no clubbing  NEURO: alert & oriented x 3 with fluent speech, no focal motor/sensory deficits  LABORATORY DATA:  I have reviewed the data as listed CBC Latest Ref Rng & Units 04/19/2019 02/28/2019 01/16/2019  WBC 4.0 - 10.5 K/uL 7.5 7.1 5.9  Hemoglobin 13.0 - 17.0 g/dL 14.3 12.7(L) 12.9(L)  Hematocrit 39.0 - 52.0 % 44.2 37.3(L) 38.3(L)  Platelets 150 - 400 K/uL 422(H) 408(H) 404(H)     CMP Latest Ref Rng & Units 04/19/2019 02/28/2019 01/16/2019  Glucose 70 - 99 mg/dL 68(L) 83 73  BUN 8 - 23 mg/dL '18 12 19  '$ Creatinine 0.61 - 1.24 mg/dL 1.20 1.02 1.17  Sodium 135 - 145 mmol/L 140 139 138  Potassium 3.5 - 5.1 mmol/L 4.2 4.1 3.9  Chloride 98 - 111 mmol/L 106 108 106  CO2 22 - 32 mmol/L 21(L) 21(L) 22  Calcium 8.9 - 10.3 mg/dL 9.6 9.4 10.0  Total Protein 6.5 - 8.1 g/dL 7.7 6.8 7.2  Total Bilirubin 0.3 - 1.2 mg/dL 0.4 0.6 0.6  Alkaline Phos 38 - 126 U/L 64 56 53  AST 15 - 41 U/L '23 23 22  '$ ALT 0 - 44  U/L '12 12 13      '$ RADIOGRAPHIC STUDIES: I have personally reviewed the radiological images as listed and agreed with the findings in the report. No results found.   ASSESSMENT & PLAN:  Ricky Santos is a 85 y.o. male with   1. Essential Thrombocytosis. JAK2(+) -He was diagnosed in March 2014, initial platelet count in the 500k range, JAK2 mutation (+), he did not have bone marrow biopsy.  -We previously discussed that this is a myeloproliferative disorder, people usually do very well, and there is small percentage pts will develop myelofibrosis and AML late on.  -He is currently on Hydrea to 1000 mg MWF and 500 mg other days.  He is tolerating well.  -He is clinically doing well and stable. Labs reviewed, PLT 422K and no anemia. CMP unremarkable. Overall adequate to continue Hydrea at same dose.  -will repeat lab in 3 months and f/u in 6 months   2. Anemia of iron deficiency  -Etiology of iron deficiency remains unclear.HisEGD andcolonoscopy with Dr. Asher Muir 03/2018 were benign -Continue oral ferrous sulfate once daily  -anemia and iron panel resolved lately.   3. MGUS -M spike 0.2 with IgG monoclonal protein with lambda light chain specificity on 12/06/17.  -Cr on 10/11/17 was elevated to 1.34, normal previously; hasnormalizedon 02/14/18 -01/24/18 bone survey is negative for lytic lesions -Last M-Protein in 10/2018 was not observed. WillcontinuemonitoringMM panel.   4. Intermittent dizziness -unlikely related to hydrea -He previously participated a course of inner ear balance PT without any success. He will follow-up with Dr. Lysle Rubens -Stable with no incident of falls  Plan -He is clinically doing well and stable  -Continue Hydrea to '1000mg'$  daily on Mondays and '500mg'$  daily for  the rest of the week -Lab in 3 months  -Lab and f/u in 6 months   No problem-specific Assessment & Plan notes found for this encounter.   No orders of the defined types were placed in  this encounter.  All questions were answered. The patient knows to call the clinic with any problems, questions or concerns. No barriers to learning was detected. I spent 10 minutes counseling the patient face to face. The total time spent in the appointment was 15 minutes and more than 50% was on counseling and review of test results     Truitt Merle, MD 04/19/2019   I, Joslyn Devon, am acting as scribe for Truitt Merle, MD.   I have reviewed the above documentation for accuracy and completeness, and I agree with the above.

## 2019-04-20 ENCOUNTER — Telehealth: Payer: Self-pay | Admitting: Hematology

## 2019-04-20 NOTE — Telephone Encounter (Signed)
Scheduled appt per 11/18 los.  Spoke with pt and he is aware of his appt dates and time.

## 2019-07-14 DIAGNOSIS — N401 Enlarged prostate with lower urinary tract symptoms: Secondary | ICD-10-CM | POA: Diagnosis not present

## 2019-07-14 DIAGNOSIS — R3915 Urgency of urination: Secondary | ICD-10-CM | POA: Diagnosis not present

## 2019-07-21 ENCOUNTER — Other Ambulatory Visit: Payer: Self-pay

## 2019-07-21 ENCOUNTER — Inpatient Hospital Stay: Payer: Medicare PPO | Attending: Hematology

## 2019-07-21 DIAGNOSIS — D473 Essential (hemorrhagic) thrombocythemia: Secondary | ICD-10-CM | POA: Diagnosis not present

## 2019-07-21 DIAGNOSIS — D472 Monoclonal gammopathy: Secondary | ICD-10-CM

## 2019-07-21 DIAGNOSIS — D509 Iron deficiency anemia, unspecified: Secondary | ICD-10-CM

## 2019-07-21 LAB — IRON AND TIBC
Iron: 204 ug/dL — ABNORMAL HIGH (ref 42–163)
Saturation Ratios: 60 % — ABNORMAL HIGH (ref 20–55)
TIBC: 339 ug/dL (ref 202–409)
UIBC: 135 ug/dL (ref 117–376)

## 2019-07-21 LAB — CBC WITH DIFFERENTIAL/PLATELET
Abs Immature Granulocytes: 0.06 10*3/uL (ref 0.00–0.07)
Basophils Absolute: 0.1 10*3/uL (ref 0.0–0.1)
Basophils Relative: 2 %
Eosinophils Absolute: 0.3 10*3/uL (ref 0.0–0.5)
Eosinophils Relative: 4 %
HCT: 41.4 % (ref 39.0–52.0)
Hemoglobin: 13.9 g/dL (ref 13.0–17.0)
Immature Granulocytes: 1 %
Lymphocytes Relative: 26 %
Lymphs Abs: 1.5 10*3/uL (ref 0.7–4.0)
MCH: 32.6 pg (ref 26.0–34.0)
MCHC: 33.6 g/dL (ref 30.0–36.0)
MCV: 97.2 fL (ref 80.0–100.0)
Monocytes Absolute: 0.5 10*3/uL (ref 0.1–1.0)
Monocytes Relative: 8 %
Neutro Abs: 3.4 10*3/uL (ref 1.7–7.7)
Neutrophils Relative %: 59 %
Platelets: 465 10*3/uL — ABNORMAL HIGH (ref 150–400)
RBC: 4.26 MIL/uL (ref 4.22–5.81)
RDW: 13.3 % (ref 11.5–15.5)
WBC: 5.8 10*3/uL (ref 4.0–10.5)
nRBC: 0 % (ref 0.0–0.2)

## 2019-07-21 LAB — CMP (CANCER CENTER ONLY)
ALT: 11 U/L (ref 0–44)
AST: 20 U/L (ref 15–41)
Albumin: 4.2 g/dL (ref 3.5–5.0)
Alkaline Phosphatase: 69 U/L (ref 38–126)
Anion gap: 10 (ref 5–15)
BUN: 15 mg/dL (ref 8–23)
CO2: 25 mmol/L (ref 22–32)
Calcium: 9.9 mg/dL (ref 8.9–10.3)
Chloride: 103 mmol/L (ref 98–111)
Creatinine: 1.2 mg/dL (ref 0.61–1.24)
GFR, Est AFR Am: >60 mL/min (ref >60)
GFR, Estimated: 56 mL/min — ABNORMAL LOW (ref >60)
Glucose, Bld: 84 mg/dL (ref 70–99)
Potassium: 3.8 mmol/L (ref 3.5–5.1)
Sodium: 138 mmol/L (ref 135–145)
Total Bilirubin: 0.5 mg/dL (ref 0.3–1.2)
Total Protein: 7.9 g/dL (ref 6.5–8.1)

## 2019-07-21 LAB — FERRITIN: Ferritin: 64 ng/mL (ref 24–336)

## 2019-07-24 ENCOUNTER — Telehealth: Payer: Self-pay | Admitting: Emergency Medicine

## 2019-07-24 NOTE — Telephone Encounter (Signed)
-----   Message from Truitt Merle, MD sent at 07/22/2019 11:24 AM EST ----- Please let pt know his lab results, and continue current dose hydrea, thanks  Truitt Merle  07/22/2019

## 2019-07-24 NOTE — Telephone Encounter (Signed)
Called pt to report lab results and give recommendations per MD Burr Medico.  Pt verbalized understanding and denies any further questions/concerns at this time.

## 2019-10-16 NOTE — Progress Notes (Signed)
[place]   Telephone:(336) [phone] Fax:(336) [phone]   Clinic Follow up Note   Patient Care Team: Wenda Low, MD as PCP - General (Internal Medicine) Nobie Putnam, MD (Hematology and Oncology) Garlan Fair, MD as Attending Physician (Gastroenterology) Rana Snare, MD as Attending Physician (Urology)  Date of Service:  10/18/2019  CHIEF COMPLAINT: Follow up on essential thrombocytosis  PREVIOUS THERAPY:Hydrea 500 mg daily, from 08/2012 to 04/01/2015, stopped on 04/02/2015 due to recurrent episodes of dizziness, restarted on 06/08/2015. He tried anagrelide 0.'5mg'$  bid for one month in 05/2015 which was not effective. Startled Hydrea 500 mg Monday-Saturday, hold on sundays due to anemia on 07/19/17, and decreased to '500mg'$  daily except Wednesday and Sunday on 12/06/2017, increased back to '500mg'$  daily except on Sundays in 01/2018. Dose further increased subsequently   CURRENT THERAPY: Increased hydrea to 1000 mg MWF and 500 mg other days due to plt count >500k in July 2020   INTERVAL HISTORY:  Ricky Santos is here for a follow up of ET. He was last seen by me 6 months ago. She presents to the clinic alone. He notes he feels stable. He is on Hydrea as prescribed. He is tolerating well. He denies any more dizziness. He overall has no concerns. He notes he changed how he gets his medications under new insurance in the past month. He has not used them yet.    REVIEW OF SYSTEMS:   Constitutional: Denies fevers, chills or abnormal weight loss Eyes: Denies blurriness of vision Ears, nose, mouth, throat, and face: Denies mucositis or sore throat Respiratory: Denies cough, dyspnea or wheezes Cardiovascular: Denies palpitation, chest discomfort or lower extremity swelling Gastrointestinal:  Denies nausea, heartburn or change in bowel habits Skin: Denies abnormal skin rashes Lymphatics: Denies new lymphadenopathy or easy bruising Neurological:Denies numbness, tingling or new  weaknesses Behavioral/Psych: Mood is stable, no new changes  All other systems were reviewed with the patient and are negative.  MEDICAL HISTORY:  Past Medical History:  Diagnosis Date  . Back pain   . Essential thrombocytosis ([place]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   . Essential thrombocytosis ([place]) 08/26/2012  . GERD (gastroesophageal reflux disease)   . Hernia   . Hyperlipidemia   . Hypertension   . Hypothyroid   . Prostate cancer [facility])     SURGICAL HISTORY: Past Surgical History:  Procedure Laterality Date  . BACK SURGERY    . COLONOSCOPY  2003  . COLONOSCOPY N/A 01/17/2013   Procedure: COLONOSCOPY;  Surgeon: Garlan Fair, MD;  Location: [facility]OSCOPY;  Service: Endoscopy;  Laterality: N/A;  . exp lap for trauma    . HERNIA REPAIR    . INCISION AND DRAINAGE ABSCESS ANAL  08/01/05  . PROSTATE SURGERY    . SHOULDER SURGERY     rt    I have reviewed the social history and family history with the patient and they are unchanged from previous note.  ALLERGIES:  has No Known Allergies.  MEDICATIONS:  Current Outpatient Medications  Medication Sig Dispense Refill  . aspirin 81 MG tablet Take 81 mg by mouth daily.      Marland Kitchen atorvastatin (LIPITOR) 40 MG tablet Take 40 mg by mouth daily.      Marland Kitchen diltiazem (CARDIZEM CD) 240 MG 24 hr capsule Take 240 mg by mouth daily.      . finasteride (PROSCAR) 5 MG tablet Take 5 mg by mouth daily.      . hydrochlorothiazide  25 MG tablet Take 25 mg by mouth daily.      . hydroxyurea (HYDREA) 500 MG capsule Take 1 capsule (500 mg total) by mouth daily. Take 2 capsules daily on Mon, Wed, Fri, and 1 capsule daily for the rest of week 130 capsule 1  . levothyroxine (SYNTHROID, LEVOTHROID) 25 MCG tablet Take 25 mcg by mouth daily.      Marland Kitchen lisinopril (PRINIVIL,ZESTRIL) 20 MG tablet Take 20 mg by mouth daily.  6  . meclizine (ANTIVERT) 25 MG tablet Take 25 mg by mouth as needed. Reported on 11/11/2015  3  . mirtazapine  (REMERON) 15 MG tablet Take 15 mg by mouth at bedtime.     . pantoprazole (PROTONIX) 40 MG tablet Take 40 mg by mouth daily.      . psyllium (METAMUCIL) 58.6 % powder Take 1 packet by mouth 3 (three) times daily. Reported on 11/11/2015     No current facility-administered medications for this visit.    PHYSICAL EXAMINATION: ECOG PERFORMANCE STATUS: 1 - Symptomatic but completely ambulatory  Vitals:   10/18/19 1108  BP: 134/76  Pulse: 71  Resp: 17  Temp: 97.7 F (36.5 C)  SpO2: 100%   Filed Weights   10/18/19 1108  Weight: 151 lb 9.6 oz (68.8 kg)    Due to COVID19 we will limit examination to appearance. Patient had no complaints.  GENERAL:alert, no distress and comfortable SKIN: skin color normal, no rashes or significant lesions EYES: normal, Conjunctiva are pink and non-injected, sclera clear  NEURO: alert & oriented x 3 with fluent speech   LABORATORY DATA:  I have reviewed the data as listed CBC Latest Ref Rng & Units 10/18/2019 07/21/2019 04/19/2019  WBC 4.0 - 10.5 K/uL 5.4 5.8 7.5  Hemoglobin 13.0 - 17.0 g/dL 13.2 13.9 14.3  Hematocrit 39.0 - 52.0 % 39.3 41.4 44.2  Platelets 150 - 400 K/uL 431(H) 465(H) 422(H)     CMP Latest Ref Rng & Units 10/18/2019 07/21/2019 04/19/2019  Glucose 70 - 99 mg/dL 98 84 68(L)  BUN 8 - 23 mg/dL '14 15 18  '$ Creatinine 0.61 - 1.24 mg/dL 1.31(H) 1.20 1.20  Sodium 135 - 145 mmol/L 136 138 140  Potassium 3.5 - 5.1 mmol/L 4.1 3.8 4.2  Chloride 98 - 111 mmol/L 103 103 106  CO2 22 - 32 mmol/L 22 25 21(L)  Calcium 8.9 - 10.3 mg/dL 9.6 9.9 9.6  Total Protein 6.5 - 8.1 g/dL 7.4 7.9 7.7  Total Bilirubin 0.3 - 1.2 mg/dL 0.6 0.5 0.4  Alkaline Phos 38 - 126 U/L 60 69 64  AST 15 - 41 U/L '23 20 23  '$ ALT 0 - 44 U/L '10 11 12      '$ RADIOGRAPHIC STUDIES: I have personally reviewed the radiological images as listed and agreed with the findings in the report. No results found.   ASSESSMENT & PLAN:  Ricky Santos is a 86 y.o. male with     1.  Essential Thrombocytosis. JAK2(+) -He was diagnosed in March 2014, initial platelet count in the 500k range, JAK2 mutation (+), he did not have bone marrow biopsy.  -We previously discussed that this is a myeloproliferative disorder, people usually do very well, and there is small percentage pts will develop myelofibrosis and AML late on.  -He is currently on Hydrea to 1000 mg MWF and 500 mg other days. He is tolerating well.  -He is clinically doing well. Labs reviewed, CBC and CMP WNL except plt 431K, Cr 1.31. Iron panel  still pending.  -Continue Hydrea at same dose.  -will repeat lab in 3 months and f/u in 6 months   2. Anemia of iron deficiency  -Etiology of iron deficiency remains unclear.HisEGD andcolonoscopy with Dr. Asher Muir 03/2018 were benign -Continue oral ferrous sulfate once daily  -anemia and iron panel resolved in 07/2019. Today's panel still pending.   3. MGUS -M spike 0.2 with IgG monoclonal protein with lambda light chain specificity on 12/06/17.  -Cr on 10/11/17 was elevated to 1.34, normal previously; hasnormalizedon 02/14/18 -01/24/18 bone survey is negative for lytic lesions -Last M-Protein in6/2020 was not observed.WillcontinuemonitoringMM panel every 6 months.   4. Intermittent dizziness -unlikely related to hydrea -He previously participated a course of inner ear balance PT without any success. He will follow-up with Dr. Lysle Rubens -Stable with no incident of falls.  -Has resolved now.    Plan -He is clinically doing well and stable  -Continue Hydrea to'1000mg'$  dailyon Mondays and '500mg'$ daily forthe rest of the week, refilled today -Lab in 3 months  -Lab and f/u in 6 months   No problem-specific Assessment & Plan notes found for this encounter.   No orders of the defined types were placed in this encounter.  All questions were answered. The patient knows to call the clinic with any problems, questions or concerns. No barriers to learning was  detected. The total time spent in the appointment was 20 minutes.     Truitt Merle, MD 10/18/2019   I, Joslyn Devon, am acting as scribe for Truitt Merle, MD.   I have reviewed the above documentation for accuracy and completeness, and I agree with the above.

## 2019-10-18 ENCOUNTER — Inpatient Hospital Stay: Payer: Medicare Other | Attending: Hematology

## 2019-10-18 ENCOUNTER — Other Ambulatory Visit: Payer: Self-pay

## 2019-10-18 ENCOUNTER — Telehealth: Payer: Self-pay | Admitting: Hematology

## 2019-10-18 ENCOUNTER — Encounter: Payer: Self-pay | Admitting: Hematology

## 2019-10-18 ENCOUNTER — Inpatient Hospital Stay: Payer: Medicare Other | Admitting: Hematology

## 2019-10-18 VITALS — BP 134/76 | HR 71 | Temp 97.7°F | Resp 17 | Ht 69.0 in | Wt 151.6 lb

## 2019-10-18 DIAGNOSIS — R42 Dizziness and giddiness: Secondary | ICD-10-CM | POA: Diagnosis not present

## 2019-10-18 DIAGNOSIS — Z7982 Long term (current) use of aspirin: Secondary | ICD-10-CM | POA: Insufficient documentation

## 2019-10-18 DIAGNOSIS — D649 Anemia, unspecified: Secondary | ICD-10-CM | POA: Diagnosis not present

## 2019-10-18 DIAGNOSIS — Z79899 Other long term (current) drug therapy: Secondary | ICD-10-CM | POA: Diagnosis not present

## 2019-10-18 DIAGNOSIS — K219 Gastro-esophageal reflux disease without esophagitis: Secondary | ICD-10-CM | POA: Insufficient documentation

## 2019-10-18 DIAGNOSIS — D472 Monoclonal gammopathy: Secondary | ICD-10-CM | POA: Insufficient documentation

## 2019-10-18 DIAGNOSIS — Z8546 Personal history of malignant neoplasm of prostate: Secondary | ICD-10-CM | POA: Diagnosis not present

## 2019-10-18 DIAGNOSIS — D473 Essential (hemorrhagic) thrombocythemia: Secondary | ICD-10-CM | POA: Diagnosis not present

## 2019-10-18 DIAGNOSIS — E785 Hyperlipidemia, unspecified: Secondary | ICD-10-CM | POA: Insufficient documentation

## 2019-10-18 DIAGNOSIS — D509 Iron deficiency anemia, unspecified: Secondary | ICD-10-CM | POA: Insufficient documentation

## 2019-10-18 DIAGNOSIS — E039 Hypothyroidism, unspecified: Secondary | ICD-10-CM | POA: Insufficient documentation

## 2019-10-18 DIAGNOSIS — I10 Essential (primary) hypertension: Secondary | ICD-10-CM | POA: Diagnosis not present

## 2019-10-18 LAB — CBC WITH DIFFERENTIAL/PLATELET
Abs Immature Granulocytes: 0.02 10*3/uL (ref 0.00–0.07)
Basophils Absolute: 0.1 10*3/uL (ref 0.0–0.1)
Basophils Relative: 2 %
Eosinophils Absolute: 0.3 10*3/uL (ref 0.0–0.5)
Eosinophils Relative: 5 %
HCT: 39.3 % (ref 39.0–52.0)
Hemoglobin: 13.2 g/dL (ref 13.0–17.0)
Immature Granulocytes: 0 %
Lymphocytes Relative: 29 %
Lymphs Abs: 1.6 10*3/uL (ref 0.7–4.0)
MCH: 32.4 pg (ref 26.0–34.0)
MCHC: 33.6 g/dL (ref 30.0–36.0)
MCV: 96.3 fL (ref 80.0–100.0)
Monocytes Absolute: 0.5 10*3/uL (ref 0.1–1.0)
Monocytes Relative: 8 %
Neutro Abs: 3 10*3/uL (ref 1.7–7.7)
Neutrophils Relative %: 56 %
Platelets: 431 10*3/uL — ABNORMAL HIGH (ref 150–400)
RBC: 4.08 MIL/uL — ABNORMAL LOW (ref 4.22–5.81)
RDW: 14.2 % (ref 11.5–15.5)
WBC: 5.4 10*3/uL (ref 4.0–10.5)
nRBC: 0 % (ref 0.0–0.2)

## 2019-10-18 LAB — FERRITIN: Ferritin: 50 ng/mL (ref 24–336)

## 2019-10-18 LAB — CMP (CANCER CENTER ONLY)
ALT: 10 U/L (ref 0–44)
AST: 23 U/L (ref 15–41)
Albumin: 3.9 g/dL (ref 3.5–5.0)
Alkaline Phosphatase: 60 U/L (ref 38–126)
Anion gap: 11 (ref 5–15)
BUN: 14 mg/dL (ref 8–23)
CO2: 22 mmol/L (ref 22–32)
Calcium: 9.6 mg/dL (ref 8.9–10.3)
Chloride: 103 mmol/L (ref 98–111)
Creatinine: 1.31 mg/dL — ABNORMAL HIGH (ref 0.61–1.24)
GFR, Est AFR Am: 58 mL/min — ABNORMAL LOW (ref >60)
GFR, Estimated: 50 mL/min — ABNORMAL LOW (ref >60)
Glucose, Bld: 98 mg/dL (ref 70–99)
Potassium: 4.1 mmol/L (ref 3.5–5.1)
Sodium: 136 mmol/L (ref 135–145)
Total Bilirubin: 0.6 mg/dL (ref 0.3–1.2)
Total Protein: 7.4 g/dL (ref 6.5–8.1)

## 2019-10-18 LAB — IRON AND TIBC
Iron: 80 ug/dL (ref 42–163)
Saturation Ratios: 24 % (ref 20–55)
TIBC: 338 ug/dL (ref 202–409)
UIBC: 258 ug/dL (ref 117–376)

## 2019-10-18 MED ORDER — HYDROXYUREA 500 MG PO CAPS: 500.0000 mg | ORAL_CAPSULE | Freq: Every day | ORAL | 5 refills | Status: DC

## 2019-10-18 MED ORDER — HYDROXYUREA 500 MG PO CAPS: 500.0000 mg | ORAL_CAPSULE | Freq: Every day | ORAL | 1 refills | Status: DC

## 2019-10-18 NOTE — Telephone Encounter (Signed)
Scheduled per 5/19 los. Pt did not need calendar and AVS.

## 2019-10-31 ENCOUNTER — Telehealth: Payer: Self-pay

## 2019-10-31 NOTE — Telephone Encounter (Signed)
-----   Message from Truitt Merle, MD sent at 10/31/2019  9:15 AM EDT ----- Please let pt know his iron study last time was normal, continue once daily iron pill if he is on (if not, he does not need to start now). Thanks   Truitt Merle  10/31/2019

## 2019-10-31 NOTE — Telephone Encounter (Signed)
I left vm letting Ricky Santos know is iron study was normal and to continue on iron pill if he was taking it. I told him to call with any question

## 2020-01-18 ENCOUNTER — Other Ambulatory Visit: Payer: Self-pay

## 2020-01-18 ENCOUNTER — Inpatient Hospital Stay: Payer: Medicare Other | Attending: Hematology

## 2020-01-18 DIAGNOSIS — D473 Essential (hemorrhagic) thrombocythemia: Secondary | ICD-10-CM | POA: Diagnosis not present

## 2020-01-18 DIAGNOSIS — D649 Anemia, unspecified: Secondary | ICD-10-CM

## 2020-01-18 DIAGNOSIS — D472 Monoclonal gammopathy: Secondary | ICD-10-CM

## 2020-01-18 LAB — FERRITIN: Ferritin: 71 ng/mL (ref 24–336)

## 2020-01-18 LAB — CMP (CANCER CENTER ONLY)
ALT: 13 U/L (ref 0–44)
AST: 25 U/L (ref 15–41)
Albumin: 3.9 g/dL (ref 3.5–5.0)
Alkaline Phosphatase: 70 U/L (ref 38–126)
Anion gap: 12 (ref 5–15)
BUN: 13 mg/dL (ref 8–23)
CO2: 24 mmol/L (ref 22–32)
Calcium: 10.5 mg/dL — ABNORMAL HIGH (ref 8.9–10.3)
Chloride: 103 mmol/L (ref 98–111)
Creatinine: 1.33 mg/dL — ABNORMAL HIGH (ref 0.61–1.24)
GFR, Est AFR Am: 56 mL/min — ABNORMAL LOW (ref >60)
GFR, Estimated: 49 mL/min — ABNORMAL LOW (ref >60)
Glucose, Bld: 110 mg/dL — ABNORMAL HIGH (ref 70–99)
Potassium: 4.1 mmol/L (ref 3.5–5.1)
Sodium: 139 mmol/L (ref 135–145)
Total Bilirubin: 0.5 mg/dL (ref 0.3–1.2)
Total Protein: 7.5 g/dL (ref 6.5–8.1)

## 2020-01-18 LAB — CBC WITH DIFFERENTIAL/PLATELET
Abs Immature Granulocytes: 0.02 10*3/uL (ref 0.00–0.07)
Basophils Absolute: 0.1 10*3/uL (ref 0.0–0.1)
Basophils Relative: 2 %
Eosinophils Absolute: 0.3 10*3/uL (ref 0.0–0.5)
Eosinophils Relative: 6 %
HCT: 42.7 % (ref 39.0–52.0)
Hemoglobin: 13.9 g/dL (ref 13.0–17.0)
Immature Granulocytes: 0 %
Lymphocytes Relative: 31 %
Lymphs Abs: 1.7 10*3/uL (ref 0.7–4.0)
MCH: 32 pg (ref 26.0–34.0)
MCHC: 32.6 g/dL (ref 30.0–36.0)
MCV: 98.2 fL (ref 80.0–100.0)
Monocytes Absolute: 0.4 10*3/uL (ref 0.1–1.0)
Monocytes Relative: 8 %
Neutro Abs: 3 10*3/uL (ref 1.7–7.7)
Neutrophils Relative %: 53 %
Platelets: 411 10*3/uL — ABNORMAL HIGH (ref 150–400)
RBC: 4.35 MIL/uL (ref 4.22–5.81)
RDW: 13.9 % (ref 11.5–15.5)
WBC: 5.5 10*3/uL (ref 4.0–10.5)
nRBC: 0 % (ref 0.0–0.2)

## 2020-01-19 LAB — KAPPA/LAMBDA LIGHT CHAINS
Kappa free light chain: 40.6 mg/L — ABNORMAL HIGH (ref 3.3–19.4)
Kappa, lambda light chain ratio: 1.89 — ABNORMAL HIGH (ref 0.26–1.65)
Lambda free light chains: 21.5 mg/L (ref 5.7–26.3)

## 2020-01-22 LAB — PROTEIN ELECTROPHORESIS, SERUM, WITH REFLEX
A/G Ratio: 1.1 (ref 0.7–1.7)
Albumin ELP: 3.7 g/dL (ref 2.9–4.4)
Alpha-1-Globulin: 0.3 g/dL (ref 0.0–0.4)
Alpha-2-Globulin: 0.8 g/dL (ref 0.4–1.0)
Beta Globulin: 0.9 g/dL (ref 0.7–1.3)
Gamma Globulin: 1.2 g/dL (ref 0.4–1.8)
Globulin, Total: 3.3 g/dL (ref 2.2–3.9)
Total Protein ELP: 7 g/dL (ref 6.0–8.5)

## 2020-01-23 ENCOUNTER — Telehealth: Payer: Self-pay

## 2020-01-23 NOTE — Telephone Encounter (Signed)
-----   Message from Truitt Merle, MD sent at 01/21/2020  5:59 PM EDT ----- Please let pt know her lab results, plt well controlled, no other concerns, thanks   Truitt Merle  01/21/2020

## 2020-01-23 NOTE — Telephone Encounter (Signed)
I left vm for Ricky Santos letting him know his platelet count  Is controlled and dr Burr Medico has no concerns.

## 2020-01-31 ENCOUNTER — Other Ambulatory Visit: Payer: Self-pay

## 2020-01-31 ENCOUNTER — Telehealth: Payer: Self-pay

## 2020-01-31 DIAGNOSIS — D473 Essential (hemorrhagic) thrombocythemia: Secondary | ICD-10-CM

## 2020-01-31 MED ORDER — HYDROXYUREA 500 MG PO CAPS: 500.0000 mg | ORAL_CAPSULE | Freq: Every day | ORAL | 1 refills | Status: DC

## 2020-01-31 NOTE — Telephone Encounter (Signed)
Ricky Santos called stating he needs a refill for his hydrea.  He request 90 day rx be sent to [facility].

## 2020-04-19 ENCOUNTER — Inpatient Hospital Stay: Payer: Medicare Other

## 2020-04-19 ENCOUNTER — Inpatient Hospital Stay: Payer: Medicare Other | Admitting: Hematology

## 2020-04-29 NOTE — Progress Notes (Signed)
[place]   Telephone:(336) [phone] Fax:(336) [phone]   Clinic Follow up Note   Patient Care Team: Wenda Low, MD as PCP - General (Internal Medicine) Nobie Putnam, MD (Hematology and Oncology) Garlan Fair, MD as Attending Physician (Gastroenterology) Rana Snare, MD as Attending Physician (Urology)  Date of Service:  04/30/2020  CHIEF COMPLAINT: Follow up on essential thrombocytosis  PREVIOUS THERAPY:Hydrea 500 mg daily, from 08/2012 to 04/01/2015, stopped on 04/02/2015 due to recurrent episodes of dizziness, restarted on 06/08/2015. He tried anagrelide 0.5mg  bid for one month in 05/2015 which was not effective. Startled Hydrea 500 mg Monday-Saturday, hold on sundays due to anemia on 07/19/17, and decreased to 500mg  daily except Wednesday and Sunday on 12/06/2017, increased back to 500mg  daily except on Sundays in 01/2018. Dose further increased subsequently  CURRENT THERAPY: Increased hydrea to 1000 mg MWF and 500 mg other days due to plt count >500kin July 2020  INTERVAL HISTORY:  Ricky Santos is here for a follow up of ET. He was last seen by me 6 months ago. He presents to the clinic alone. He notes he is doing well and stable. He denies any new major changes or infections or bleeding. He notes his dizzy spells has mostly stopped. He notes his energy level is stable.  He notes he is on Hydrea at 1000mg  on MWF and 500mg  the rest of the week. He notes he has only skipped 1 pill.    REVIEW OF SYSTEMS:   Constitutional: Denies fevers, chills or abnormal weight loss Eyes: Denies blurriness of vision Ears, nose, mouth, throat, and face: Denies mucositis or sore throat Respiratory: Denies cough, dyspnea or wheezes Cardiovascular: Denies palpitation, chest discomfort or lower extremity swelling Gastrointestinal:  Denies nausea, heartburn or change in bowel habits Skin: Denies abnormal skin rashes Lymphatics: Denies new lymphadenopathy or easy  bruising Neurological:Denies numbness, tingling or new weaknesses Behavioral/Psych: Mood is stable, no new changes  All other systems were reviewed with the patient and are negative.  MEDICAL HISTORY:  Past Medical History:  Diagnosis Date  . Back pain   . Essential thrombocytosis ([place]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   . Essential thrombocytosis ([place]) 08/26/2012  . GERD (gastroesophageal reflux disease)   . Hernia   . Hyperlipidemia   . Hypertension   . Hypothyroid   . Prostate cancer [facility])     SURGICAL HISTORY: Past Surgical History:  Procedure Laterality Date  . BACK SURGERY    . COLONOSCOPY  2003  . COLONOSCOPY N/A 01/17/2013   Procedure: COLONOSCOPY;  Surgeon: Garlan Fair, MD;  Location: [facility]COPY;  Service: Endoscopy;  Laterality: N/A;  . exp lap for trauma    . HERNIA REPAIR    . INCISION AND DRAINAGE ABSCESS ANAL  08/01/05  . PROSTATE SURGERY    . SHOULDER SURGERY     rt    I have reviewed the social history and family history with the patient and they are unchanged from previous note.  ALLERGIES:  has No Known Allergies.  MEDICATIONS:  Current Outpatient Medications  Medication Sig Dispense Refill  . aspirin 81 MG tablet Take 81 mg by mouth daily.      Marland Kitchen atorvastatin (LIPITOR) 40 MG tablet Take 40 mg by mouth daily.      Marland Kitchen diltiazem (CARDIZEM CD) 240 MG 24 hr capsule Take 240 mg by mouth daily.      . finasteride (PROSCAR) 5 MG tablet Take 5 mg by  mouth daily.      . hydrochlorothiazide 25 MG tablet Take 25 mg by mouth daily.      . hydroxyurea (HYDREA) 500 MG capsule Take 1 capsule (500 mg total) by mouth daily. Take 2 capsules daily on Mon, Wed, Fri, and 1 capsule daily for the rest of week 130 capsule 1  . levothyroxine (SYNTHROID, LEVOTHROID) 25 MCG tablet Take 25 mcg by mouth daily.      Marland Kitchen lisinopril (PRINIVIL,ZESTRIL) 20 MG tablet Take 20 mg by mouth daily.  6  . meclizine (ANTIVERT) 25 MG tablet Take 25 mg by mouth  as needed. Reported on 11/11/2015  3  . mirtazapine (REMERON) 15 MG tablet Take 15 mg by mouth at bedtime.     . pantoprazole (PROTONIX) 40 MG tablet Take 40 mg by mouth daily.      . psyllium (METAMUCIL) 58.6 % powder Take 1 packet by mouth 3 (three) times daily. Reported on 11/11/2015     No current facility-administered medications for this visit.    PHYSICAL EXAMINATION: ECOG PERFORMANCE STATUS: 1 - Symptomatic but completely ambulatory  Vitals:   04/30/20 1350  BP: (!) 151/81  Pulse: 80  Resp: 15  Temp: (!) 97 F (36.1 C)  SpO2: 100%   Filed Weights   04/30/20 1350  Weight: 156 lb 4.8 oz (70.9 kg)    Due to COVID19 we will limit examination to appearance. Patient had no complaints.  GENERAL:alert, no distress and comfortable SKIN: skin color normal, no rashes or significant lesions EYES: normal, Conjunctiva are pink and non-injected, sclera clear  NEURO: alert & oriented x 3 with fluent speech  LABORATORY DATA:  I have reviewed the data as listed CBC Latest Ref Rng & Units 04/30/2020 01/18/2020 10/18/2019  WBC 4.0 - 10.5 K/uL 6.3 5.5 5.4  Hemoglobin 13.0 - 17.0 g/dL 12.9(L) 13.9 13.2  Hematocrit 39 - 52 % 39.3 42.7 39.3  Platelets 150 - 400 K/uL 534(H) 411(H) 431(H)     CMP Latest Ref Rng & Units 04/30/2020 01/18/2020 10/18/2019  Glucose 70 - 99 mg/dL 78 110(H) 98  BUN 8 - 23 mg/dL $Remove'17 13 14  'COyUqAi$ Creatinine 0.61 - 1.24 mg/dL 1.33(H) 1.33(H) 1.31(H)  Sodium 135 - 145 mmol/L 137 139 136  Potassium 3.5 - 5.1 mmol/L 3.8 4.1 4.1  Chloride 98 - 111 mmol/L 106 103 103  CO2 22 - 32 mmol/L 20(L) 24 22  Calcium 8.9 - 10.3 mg/dL 10.1 10.5(H) 9.6  Total Protein 6.5 - 8.1 g/dL 8.3(H) 7.5 7.4  Total Bilirubin 0.3 - 1.2 mg/dL 0.4 0.5 0.6  Alkaline Phos 38 - 126 U/L 76 70 60  AST 15 - 41 U/L $Remo'25 25 23  'qfNys$ ALT 0 - 44 U/L $Remo'9 13 10      'pDtlU$ RADIOGRAPHIC STUDIES: I have personally reviewed the radiological images as listed and agreed with the findings in the report. No results found.    ASSESSMENT & PLAN:  Ricky Santos is a 86 y.o. male with   1. Essential Thrombocytosis. JAK2(+) -He was diagnosed in March 2014, initial platelet count in the 500k range, JAK2 mutation (+), he did not have bone marrow biopsy.  -We previously discussed that this is a myeloproliferative disorder, people usually do very well, and there is small percentage pts will develop myelofibrosis and AML late on. -He is currently on Hydrea to 1000 mg MWF and 500 mg other days. He is tolerating well.  -He is clinically doing well and stable. Labs reviewed, plt increased  to 534K. This is the first time plt is above 500K on this regimen.  -Will repeat his labs in 4-5 weeks. If platelet still this elevated, will change Hydrea to 500mg  on MWF and 1000mg  the rest of the week.  -F/u in 3 months   2. Anemia of iron deficiency  -Etiology of iron deficiency remains unclear.HisEGD andcolonoscopy with Dr. Asher Muir 03/2018 were benign -Continue oral ferrous sulfate once daily  -anemiaand iron panel resolved in 07/2019, but with increase in platelet, his Hg increased to 12.9 today (04/30/20)  3. MGUS -M spike 0.2 with IgG monoclonal protein with lambda light chain specificity on 12/06/17.  -Cr on 10/11/17 was elevated to 1.34, normal previously; hasnormalizedon 02/14/18 -01/24/18 bone survey is negative for lytic lesions -Last M-Protein in8/2021 was not observed.Light chain was stable. WillcontinuemonitoringMM panel every 6 months.  Plan -He is clinically doing well and stable  -Continue Hydrea to1000mg  dailyon MWF and 500mg daily forthe rest of the week, refilled today -Lab in 4-5 weeks, if his plt still >500K, will increase hydrea dose by 500mg /week  -Lab and F/u in 3 months    No problem-specific Assessment & Plan notes found for this encounter.   No orders of the defined types were placed in this encounter.  All questions were answered. The patient knows to call the clinic with any  problems, questions or concerns. No barriers to learning was detected.      Truitt Merle, MD 04/30/2020   I, Joslyn Devon, am acting as scribe for Truitt Merle, MD.   I have reviewed the above documentation for accuracy and completeness, and I agree with the above.

## 2020-04-30 ENCOUNTER — Inpatient Hospital Stay: Payer: Medicare Other | Attending: Hematology

## 2020-04-30 ENCOUNTER — Other Ambulatory Visit: Payer: Self-pay

## 2020-04-30 ENCOUNTER — Inpatient Hospital Stay: Payer: Medicare Other | Admitting: Hematology

## 2020-04-30 ENCOUNTER — Encounter: Payer: Self-pay | Admitting: Hematology

## 2020-04-30 ENCOUNTER — Telehealth: Payer: Self-pay | Admitting: Hematology

## 2020-04-30 VITALS — BP 151/81 | HR 80 | Temp 97.0°F | Resp 15 | Wt 156.3 lb

## 2020-04-30 DIAGNOSIS — Z79899 Other long term (current) drug therapy: Secondary | ICD-10-CM | POA: Insufficient documentation

## 2020-04-30 DIAGNOSIS — Z7982 Long term (current) use of aspirin: Secondary | ICD-10-CM | POA: Insufficient documentation

## 2020-04-30 DIAGNOSIS — D473 Essential (hemorrhagic) thrombocythemia: Secondary | ICD-10-CM

## 2020-04-30 DIAGNOSIS — D472 Monoclonal gammopathy: Secondary | ICD-10-CM | POA: Insufficient documentation

## 2020-04-30 DIAGNOSIS — E785 Hyperlipidemia, unspecified: Secondary | ICD-10-CM | POA: Diagnosis not present

## 2020-04-30 DIAGNOSIS — K219 Gastro-esophageal reflux disease without esophagitis: Secondary | ICD-10-CM | POA: Insufficient documentation

## 2020-04-30 DIAGNOSIS — R42 Dizziness and giddiness: Secondary | ICD-10-CM | POA: Diagnosis not present

## 2020-04-30 DIAGNOSIS — I10 Essential (primary) hypertension: Secondary | ICD-10-CM | POA: Diagnosis not present

## 2020-04-30 DIAGNOSIS — D509 Iron deficiency anemia, unspecified: Secondary | ICD-10-CM | POA: Insufficient documentation

## 2020-04-30 DIAGNOSIS — Z8546 Personal history of malignant neoplasm of prostate: Secondary | ICD-10-CM | POA: Insufficient documentation

## 2020-04-30 DIAGNOSIS — E039 Hypothyroidism, unspecified: Secondary | ICD-10-CM | POA: Insufficient documentation

## 2020-04-30 LAB — CBC WITH DIFFERENTIAL/PLATELET
Abs Immature Granulocytes: 0.02 10*3/uL (ref 0.00–0.07)
Basophils Absolute: 0.1 10*3/uL (ref 0.0–0.1)
Basophils Relative: 2 %
Eosinophils Absolute: 0.3 10*3/uL (ref 0.0–0.5)
Eosinophils Relative: 4 %
HCT: 39.3 % (ref 39.0–52.0)
Hemoglobin: 12.9 g/dL — ABNORMAL LOW (ref 13.0–17.0)
Immature Granulocytes: 0 %
Lymphocytes Relative: 26 %
Lymphs Abs: 1.7 10*3/uL (ref 0.7–4.0)
MCH: 32.1 pg (ref 26.0–34.0)
MCHC: 32.8 g/dL (ref 30.0–36.0)
MCV: 97.8 fL (ref 80.0–100.0)
Monocytes Absolute: 0.5 10*3/uL (ref 0.1–1.0)
Monocytes Relative: 8 %
Neutro Abs: 3.7 10*3/uL (ref 1.7–7.7)
Neutrophils Relative %: 60 %
Platelets: 534 10*3/uL — ABNORMAL HIGH (ref 150–400)
RBC: 4.02 MIL/uL — ABNORMAL LOW (ref 4.22–5.81)
RDW: 14.4 % (ref 11.5–15.5)
WBC: 6.3 10*3/uL (ref 4.0–10.5)
nRBC: 0 % (ref 0.0–0.2)

## 2020-04-30 LAB — CMP (CANCER CENTER ONLY)
ALT: 9 U/L (ref 0–44)
AST: 25 U/L (ref 15–41)
Albumin: 4.1 g/dL (ref 3.5–5.0)
Alkaline Phosphatase: 76 U/L (ref 38–126)
Anion gap: 11 (ref 5–15)
BUN: 17 mg/dL (ref 8–23)
CO2: 20 mmol/L — ABNORMAL LOW (ref 22–32)
Calcium: 10.1 mg/dL (ref 8.9–10.3)
Chloride: 106 mmol/L (ref 98–111)
Creatinine: 1.33 mg/dL — ABNORMAL HIGH (ref 0.61–1.24)
GFR, Estimated: 53 mL/min — ABNORMAL LOW (ref >60)
Glucose, Bld: 78 mg/dL (ref 70–99)
Potassium: 3.8 mmol/L (ref 3.5–5.1)
Sodium: 137 mmol/L (ref 135–145)
Total Bilirubin: 0.4 mg/dL (ref 0.3–1.2)
Total Protein: 8.3 g/dL — ABNORMAL HIGH (ref 6.5–8.1)

## 2020-04-30 NOTE — Telephone Encounter (Signed)
Scheduled appointments per 11/30 los. Spoke to patient who is aware of appointments dates and times.  °

## 2020-06-04 ENCOUNTER — Inpatient Hospital Stay: Payer: Medicare PPO | Attending: Hematology

## 2020-06-04 DIAGNOSIS — D473 Essential (hemorrhagic) thrombocythemia: Secondary | ICD-10-CM | POA: Insufficient documentation

## 2020-06-05 ENCOUNTER — Telehealth: Payer: Self-pay | Admitting: Hematology

## 2020-06-05 NOTE — Telephone Encounter (Signed)
Rescheduled lab appointment per 1/5 schedule message. Patient is aware of changes.

## 2020-06-06 ENCOUNTER — Other Ambulatory Visit: Payer: Self-pay

## 2020-06-06 ENCOUNTER — Inpatient Hospital Stay: Payer: Medicare PPO

## 2020-06-06 DIAGNOSIS — D473 Essential (hemorrhagic) thrombocythemia: Secondary | ICD-10-CM

## 2020-06-06 DIAGNOSIS — D472 Monoclonal gammopathy: Secondary | ICD-10-CM

## 2020-06-06 LAB — CMP (CANCER CENTER ONLY)
ALT: 14 U/L (ref 0–44)
AST: 24 U/L (ref 15–41)
Albumin: 4 g/dL (ref 3.5–5.0)
Alkaline Phosphatase: 72 U/L (ref 38–126)
Anion gap: 11 (ref 5–15)
BUN: 13 mg/dL (ref 8–23)
CO2: 25 mmol/L (ref 22–32)
Calcium: 9.9 mg/dL (ref 8.9–10.3)
Chloride: 105 mmol/L (ref 98–111)
Creatinine: 1.34 mg/dL — ABNORMAL HIGH (ref 0.61–1.24)
GFR, Estimated: 52 mL/min — ABNORMAL LOW (ref >60)
Glucose, Bld: 99 mg/dL (ref 70–99)
Potassium: 3.8 mmol/L (ref 3.5–5.1)
Sodium: 141 mmol/L (ref 135–145)
Total Bilirubin: 0.6 mg/dL (ref 0.3–1.2)
Total Protein: 8.1 g/dL (ref 6.5–8.1)

## 2020-06-06 LAB — CBC WITH DIFFERENTIAL/PLATELET
Abs Immature Granulocytes: 0 10*3/uL (ref 0.00–0.07)
Basophils Absolute: 0.1 10*3/uL (ref 0.0–0.1)
Basophils Relative: 2 %
Eosinophils Absolute: 0.3 10*3/uL (ref 0.0–0.5)
Eosinophils Relative: 6 %
HCT: 40.7 % (ref 39.0–52.0)
Hemoglobin: 13.7 g/dL (ref 13.0–17.0)
Immature Granulocytes: 0 %
Lymphocytes Relative: 32 %
Lymphs Abs: 1.5 10*3/uL (ref 0.7–4.0)
MCH: 33.3 pg (ref 26.0–34.0)
MCHC: 33.7 g/dL (ref 30.0–36.0)
MCV: 98.8 fL (ref 80.0–100.0)
Monocytes Absolute: 0.4 10*3/uL (ref 0.1–1.0)
Monocytes Relative: 8 %
Neutro Abs: 2.4 10*3/uL (ref 1.7–7.7)
Neutrophils Relative %: 52 %
Platelets: 324 10*3/uL (ref 150–400)
RBC: 4.12 MIL/uL — ABNORMAL LOW (ref 4.22–5.81)
RDW: 15.8 % — ABNORMAL HIGH (ref 11.5–15.5)
WBC: 4.6 10*3/uL (ref 4.0–10.5)
nRBC: 0 % (ref 0.0–0.2)

## 2020-06-10 ENCOUNTER — Telehealth: Payer: Self-pay

## 2020-06-10 NOTE — Telephone Encounter (Signed)
I spoke with Mr Shahan and relayed Dr Ernestina Penna comments and recommendations.  He verbalized understanding.

## 2020-06-10 NOTE — Telephone Encounter (Signed)
-----   Message from Truitt Merle, MD sent at 06/09/2020 11:53 AM EST ----- Please let pt know his plt is normal now, continue current hydrea dose, thanks  Truitt Merle

## 2020-08-09 DIAGNOSIS — C61 Malignant neoplasm of prostate: Secondary | ICD-10-CM | POA: Diagnosis not present

## 2020-08-13 DIAGNOSIS — H40013 Open angle with borderline findings, low risk, bilateral: Secondary | ICD-10-CM | POA: Diagnosis not present

## 2020-08-13 DIAGNOSIS — H43392 Other vitreous opacities, left eye: Secondary | ICD-10-CM | POA: Diagnosis not present

## 2020-08-13 DIAGNOSIS — Z961 Presence of intraocular lens: Secondary | ICD-10-CM | POA: Diagnosis not present

## 2020-08-13 DIAGNOSIS — H2512 Age-related nuclear cataract, left eye: Secondary | ICD-10-CM | POA: Diagnosis not present

## 2020-08-16 DIAGNOSIS — N401 Enlarged prostate with lower urinary tract symptoms: Secondary | ICD-10-CM | POA: Diagnosis not present

## 2020-08-16 DIAGNOSIS — R351 Nocturia: Secondary | ICD-10-CM | POA: Diagnosis not present

## 2020-08-16 DIAGNOSIS — C61 Malignant neoplasm of prostate: Secondary | ICD-10-CM | POA: Diagnosis not present

## 2020-08-19 ENCOUNTER — Telehealth: Payer: Self-pay | Admitting: Hematology

## 2020-08-19 NOTE — Telephone Encounter (Signed)
R/s 3/24 appt to earlier time per patient request. Confirmed with patient

## 2020-08-19 NOTE — Progress Notes (Signed)
[place]   [phone] Fax:(336) [phone]   Clinic Follow up Note   Patient Care Team: Wenda Low, MD as PCP - General (Internal Medicine) Nobie Putnam, MD (Hematology and Oncology) Garlan Fair, MD as Attending Physician (Gastroenterology) Rana Snare, MD as Attending Physician (Urology)  Date of Service:  08/22/2020  CHIEF COMPLAINT: Follow up on essential thrombocytosis  PREVIOUS THERAPY:Hydrea 500 mg daily, from 08/2012 to 04/01/2015, stopped on 04/02/2015 due to recurrent episodes of dizziness, restarted on 06/08/2015. He tried anagrelide 0.65m bid for one month in 05/2015 which was not effective. Startled Hydrea 500 mg Monday-Saturday, hold on sundays due to anemia on 07/19/17, and decreased to 5067mdaily except Wednesday and Sunday on 12/06/2017, increased back to 50068maily except on Sundays in 01/2018. Dose further increased subsequently  CURRENT THERAPY: Increased hydrea to 1000 mg MWF and 500 mg other days due to plt count >500kin July 2020  INTERVAL HISTORY:  Ricky Santos here for a follow up of ET. He was last seen by me in 04/2020. He presents to the clinic alone.  He is clinically stable, still has mild intermittent dizziness, which overall is better.  He denies any chest discomfort, or GI symptoms.  No recent leg edema, or other new complaints.  Mild fatigue is stable he is able to function well at home.   All other systems were reviewed with the patient and are negative.  MEDICAL HISTORY:  Past Medical History:  Diagnosis Date  . Back pain   . Essential [place] ([facility]   JAK-2 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   . [place]ocytosis ([facility]/2014  . GERD (gastroesophageal reflux disease)   . Hernia   . Hyperlipidemia   . Hypertension   . Hypothyroid   [facility]   SURGICAL HISTORY: Past Surgical History:  Procedure Laterality Date  . BACK SURGERY    . COLONOSCOPY   2003  . COLONOSCOPY N/A 01/17/2013   Procedure: COLONOSCOPY;  Surgeon: MarGarlan FairD;  Location: [facility];  Service: Endoscopy;  Laterality: N/A;  . exp lap for trauma    . HERNIA REPAIR    . INCISION AND DRAINAGE ABSCESS ANAL  08/01/05  . PROSTATE SURGERY    . SHOULDER SURGERY     rt    I have reviewed the social history and family history with the patient and they are unchanged from previous note.  ALLERGIES:  has No Known Allergies.  MEDICATIONS:  Current Outpatient Medications  Medication Sig Dispense Refill  . aspirin 81 MG tablet Take 81 mg by mouth daily.      . aMarland Kitchenorvastatin (LIPITOR) 40 MG tablet Take 40 mg by mouth daily.      . dMarland Kitchenltiazem (CARDIZEM CD) 240 MG 24 hr capsule Take 240 mg by mouth daily.      . finasteride (PROSCAR) 5 MG tablet Take 5 mg by mouth daily.      . hydrochlorothiazide 25 MG tablet Take 25 mg by mouth daily.      . hydroxyurea (HYDREA) 500 MG capsule Take 1 capsule (500 mg total) by mouth daily. Take 2 capsules daily on Mon, Wed, Fri, and 1 capsule daily for the rest of week 130 capsule 1  . levothyroxine (SYNTHROID, LEVOTHROID) 25 MCG tablet Take 25 mcg by mouth daily.      . lMarland Kitchensinopril (PRINIVIL,ZESTRIL) 20 MG tablet Take 20 mg by mouth daily.  6  . meclizine (ANTIVERT) 25  MG tablet Take 25 mg by mouth as needed. Reported on 11/11/2015  3  . mirtazapine (REMERON) 15 MG tablet Take 15 mg by mouth at bedtime.     . pantoprazole (PROTONIX) 40 MG tablet Take 40 mg by mouth daily.      . psyllium (METAMUCIL) 58.6 % powder Take 1 packet by mouth 3 (three) times daily. Reported on 11/11/2015     No current facility-administered medications for this visit.    PHYSICAL EXAMINATION: ECOG PERFORMANCE STATUS: 1 - Symptomatic but completely ambulatory  Vitals:   08/22/20 0832  BP: (!) 155/88  Pulse: 81  Resp: 17  Temp: (!) 97.3 F (36.3 C)  SpO2: 100%   Filed Weights   08/22/20 0832  Weight: 155 lb 12.8 oz (70.7 kg)    GENERAL:alert, no  distress and comfortable SKIN: skin color, texture, turgor are normal, no rashes or significant lesions EYES: normal, Conjunctiva are pink and non-injected, sclera clear Musculoskeletal:no cyanosis of digits and no clubbing  NEURO: alert & oriented x 3 with fluent speech, no focal motor/sensory deficits  LABORATORY DATA:  I have reviewed the data as listed CBC Latest Ref Rng & Units 08/22/2020 06/06/2020 04/30/2020  WBC 4.0 - 10.5 K/uL 5.9 4.6 6.3  Hemoglobin 13.0 - 17.0 g/dL 13.2 13.7 12.9(L)  Hematocrit 39.0 - 52.0 % 41.5 40.7 39.3  Platelets 150 - 400 K/uL 508(H) 324 534(H)     CMP Latest Ref Rng & Units 08/22/2020 06/06/2020 04/30/2020  Glucose 70 - 99 mg/dL 110(H) 99 78  BUN 8 - 23 mg/dL _0 Creatinine 0.61 - 1.24 mg/dL 1.24 1.34(H) 1.33(H)  Sodium 135 - 145 mmol/L 141 141 137  Potassium 3.5 - 5.1 mmol/L 4.2 3.8 3.8  Chloride 98 - 111 mmol/L 105 105 106  CO2 22 - 32 mmol/L 24 25 20(L)  Calcium 8.9 - 10.3 mg/dL 9.4 9.9 10.1  Total Protein 6.5 - 8.1 g/dL 7.5 8.1 8.3(H)  Total Bilirubin 0.3 - 1.2 mg/dL 0.7 0.6 0.4  Alkaline Phos 38 - 126 U/L 64 72 76  AST 15 - 41 U/L _1 ALT 0 - 44 U/L _2 RADIOGRAPHIC STUDIES: I have personally reviewed the radiological images as listed and agreed with the findings in the report. No results found.   ASSESSMENT & PLAN:  Ricky Santos is a 87 y.o. male with    1. Essential Thrombocytosis. JAK2(+) -He was diagnosed in March 2014, initial platelet count in the 500k range, JAK2 mutation (+), he did not have bone marrow biopsy.  -We previously discussed that this is a myeloproliferative disorder, people usually do very well, and there is small percentage pts will develop myelofibrosis and AML late on. -He is currently on Hydrea to 1000 mg MWF and 500 mg other days since 2020. He is tolerating well. -Lab reviewed, platelet 508K, it was 324K 2 months ago, he has been compliant with Hydrea at the same dose.  We will continue  current dose, and monitor every 2 months.  If he has persistent thrombocytosis above 500K, I will slightly increase his Hydrea dose.   2. Anemia of iron deficiency  -Etiology of iron deficiency remains unclear.HisEGD andcolonoscopy with Dr. Asher Muir 03/2018 were benign -Continue oral ferrous sulfate once daily  -anemiaand iron panel resolvedin 07/2019, will continue to monitor   3. MGUS -M spike 0.2 with IgG monoclonal protein with lambda light chain specificity on 12/06/17.  -Cr on 10/11/17  was elevated to 1.34, normal previously; hasnormalizedon 02/14/18 -01/24/18 bone survey is negative for lytic lesions -Last M-Protein in8/2021 was not observed.Light chain was stable. WillcontinuemonitoringMM panelevery 6 months.  Plan -He is clinically doing well and stable  -Continue Hydrea to1026m dailyon MWF and 5081maily forthe rest of the week -Lab in 2 months, if his plt still >500K persistently, will increase hydrea dose by 5001meek  -Lab and F/u in 4 months    No problem-specific Assessment & Plan notes found for this encounter.   No orders of the defined types were placed in this encounter.  All questions were answered. The patient knows to call the clinic with any problems, questions or concerns. No barriers to learning was detected. The total time spent in the appointment was 20 minutes.     YanTruitt MerleD 08/22/2020   I, AmoJoslyn Devonm acting as scribe for YanTruitt MerleD.   I have reviewed the above documentation for accuracy and completeness, and I agree with the above.

## 2020-08-22 ENCOUNTER — Inpatient Hospital Stay (HOSPITAL_BASED_OUTPATIENT_CLINIC_OR_DEPARTMENT_OTHER): Payer: Medicare Other | Admitting: Hematology

## 2020-08-22 ENCOUNTER — Encounter: Payer: Self-pay | Admitting: Hematology

## 2020-08-22 ENCOUNTER — Other Ambulatory Visit: Payer: Self-pay

## 2020-08-22 ENCOUNTER — Inpatient Hospital Stay: Payer: Medicare Other

## 2020-08-22 ENCOUNTER — Inpatient Hospital Stay: Payer: Medicare Other | Admitting: Hematology

## 2020-08-22 ENCOUNTER — Inpatient Hospital Stay: Payer: Medicare Other | Attending: Hematology

## 2020-08-22 VITALS — BP 155/88 | HR 81 | Temp 97.3°F | Resp 17 | Ht 69.0 in | Wt 155.8 lb

## 2020-08-22 DIAGNOSIS — K219 Gastro-esophageal reflux disease without esophagitis: Secondary | ICD-10-CM | POA: Insufficient documentation

## 2020-08-22 DIAGNOSIS — E785 Hyperlipidemia, unspecified: Secondary | ICD-10-CM | POA: Diagnosis not present

## 2020-08-22 DIAGNOSIS — I10 Essential (primary) hypertension: Secondary | ICD-10-CM | POA: Insufficient documentation

## 2020-08-22 DIAGNOSIS — E039 Hypothyroidism, unspecified: Secondary | ICD-10-CM | POA: Insufficient documentation

## 2020-08-22 DIAGNOSIS — Z79899 Other long term (current) drug therapy: Secondary | ICD-10-CM | POA: Diagnosis not present

## 2020-08-22 DIAGNOSIS — D473 Essential (hemorrhagic) thrombocythemia: Secondary | ICD-10-CM | POA: Diagnosis not present

## 2020-08-22 DIAGNOSIS — R5383 Other fatigue: Secondary | ICD-10-CM | POA: Insufficient documentation

## 2020-08-22 DIAGNOSIS — D472 Monoclonal gammopathy: Secondary | ICD-10-CM

## 2020-08-22 DIAGNOSIS — R42 Dizziness and giddiness: Secondary | ICD-10-CM | POA: Diagnosis not present

## 2020-08-22 DIAGNOSIS — D509 Iron deficiency anemia, unspecified: Secondary | ICD-10-CM | POA: Diagnosis not present

## 2020-08-22 DIAGNOSIS — D649 Anemia, unspecified: Secondary | ICD-10-CM

## 2020-08-22 DIAGNOSIS — Z7982 Long term (current) use of aspirin: Secondary | ICD-10-CM | POA: Insufficient documentation

## 2020-08-22 DIAGNOSIS — Z8546 Personal history of malignant neoplasm of prostate: Secondary | ICD-10-CM | POA: Insufficient documentation

## 2020-08-22 LAB — FERRITIN: Ferritin: 30 ng/mL (ref 24–336)

## 2020-08-22 LAB — CMP (CANCER CENTER ONLY)
ALT: 20 U/L (ref 0–44)
AST: 29 U/L (ref 15–41)
Albumin: 4 g/dL (ref 3.5–5.0)
Alkaline Phosphatase: 64 U/L (ref 38–126)
Anion gap: 12 (ref 5–15)
BUN: 12 mg/dL (ref 8–23)
CO2: 24 mmol/L (ref 22–32)
Calcium: 9.4 mg/dL (ref 8.9–10.3)
Chloride: 105 mmol/L (ref 98–111)
Creatinine: 1.24 mg/dL (ref 0.61–1.24)
GFR, Estimated: 57 mL/min — ABNORMAL LOW (ref >60)
Glucose, Bld: 110 mg/dL — ABNORMAL HIGH (ref 70–99)
Potassium: 4.2 mmol/L (ref 3.5–5.1)
Sodium: 141 mmol/L (ref 135–145)
Total Bilirubin: 0.7 mg/dL (ref 0.3–1.2)
Total Protein: 7.5 g/dL (ref 6.5–8.1)

## 2020-08-22 LAB — CBC WITH DIFFERENTIAL/PLATELET
Abs Immature Granulocytes: 0.01 10*3/uL (ref 0.00–0.07)
Basophils Absolute: 0.1 10*3/uL (ref 0.0–0.1)
Basophils Relative: 2 %
Eosinophils Absolute: 0.3 10*3/uL (ref 0.0–0.5)
Eosinophils Relative: 5 %
HCT: 41.5 % (ref 39.0–52.0)
Hemoglobin: 13.2 g/dL (ref 13.0–17.0)
Immature Granulocytes: 0 %
Lymphocytes Relative: 28 %
Lymphs Abs: 1.7 10*3/uL (ref 0.7–4.0)
MCH: 29.7 pg (ref 26.0–34.0)
MCHC: 31.8 g/dL (ref 30.0–36.0)
MCV: 93.3 fL (ref 80.0–100.0)
Monocytes Absolute: 0.5 10*3/uL (ref 0.1–1.0)
Monocytes Relative: 8 %
Neutro Abs: 3.4 10*3/uL (ref 1.7–7.7)
Neutrophils Relative %: 57 %
Platelets: 508 10*3/uL — ABNORMAL HIGH (ref 150–400)
RBC: 4.45 MIL/uL (ref 4.22–5.81)
RDW: 14 % (ref 11.5–15.5)
WBC: 5.9 10*3/uL (ref 4.0–10.5)
nRBC: 0 % (ref 0.0–0.2)

## 2020-08-23 LAB — KAPPA/LAMBDA LIGHT CHAINS
Kappa free light chain: 34.5 mg/L — ABNORMAL HIGH (ref 3.3–19.4)
Kappa, lambda light chain ratio: 1.73 — ABNORMAL HIGH (ref 0.26–1.65)
Lambda free light chains: 20 mg/L (ref 5.7–26.3)

## 2020-08-28 LAB — IMMUNOFIXATION REFLEX, SERUM
IgA: 162 mg/dL (ref 61–437)
IgG (Immunoglobin G), Serum: 1297 mg/dL (ref 603–1613)
IgM (Immunoglobulin M), Srm: 65 mg/dL (ref 15–143)

## 2020-08-28 LAB — PROTEIN ELECTROPHORESIS, SERUM, WITH REFLEX
A/G Ratio: 1.1 (ref 0.7–1.7)
Albumin ELP: 3.9 g/dL (ref 2.9–4.4)
Alpha-1-Globulin: 0.3 g/dL (ref 0.0–0.4)
Alpha-2-Globulin: 0.9 g/dL (ref 0.4–1.0)
Beta Globulin: 1 g/dL (ref 0.7–1.3)
Gamma Globulin: 1.2 g/dL (ref 0.4–1.8)
Globulin, Total: 3.4 g/dL (ref 2.2–3.9)
M-Spike, %: 0.2 g/dL — ABNORMAL HIGH
SPEP Interpretation: 0
Total Protein ELP: 7.3 g/dL (ref 6.0–8.5)

## 2020-09-02 ENCOUNTER — Telehealth: Payer: Self-pay

## 2020-09-02 NOTE — Telephone Encounter (Signed)
-----   Message from Gardiner Rhyme, RN sent at 09/02/2020  2:25 PM EDT -----  ----- Message ----- From: Truitt Merle, MD Sent: 09/01/2020   9:33 AM EDT To: [facility]  Please let pt know his MGUS lab result, M-protein was 0.2, overall stable (0-0.2), light chain level also stable, no new concerns, thanks   Truitt Merle  09/01/2020

## 2020-09-02 NOTE — Telephone Encounter (Signed)
Attempted to call pt to give lab results per Dr. Burr Medico. Pt did not answer. LVM for pt to return call.

## 2020-09-03 ENCOUNTER — Telehealth: Payer: Self-pay

## 2020-09-03 NOTE — Telephone Encounter (Signed)
I spoke with Ricky Santos regarding recent lab resutls.  I relayed Dr Ernestina Penna comments and recommendations.  He verbalized understanding.

## 2020-10-07 DIAGNOSIS — Z1389 Encounter for screening for other disorder: Secondary | ICD-10-CM | POA: Diagnosis not present

## 2020-10-07 DIAGNOSIS — C61 Malignant neoplasm of prostate: Secondary | ICD-10-CM | POA: Diagnosis not present

## 2020-10-07 DIAGNOSIS — E782 Mixed hyperlipidemia: Secondary | ICD-10-CM | POA: Diagnosis not present

## 2020-10-07 DIAGNOSIS — Z0000 Encounter for general adult medical examination without abnormal findings: Secondary | ICD-10-CM | POA: Diagnosis not present

## 2020-10-07 DIAGNOSIS — I10 Essential (primary) hypertension: Secondary | ICD-10-CM | POA: Diagnosis not present

## 2020-10-07 DIAGNOSIS — N400 Enlarged prostate without lower urinary tract symptoms: Secondary | ICD-10-CM | POA: Diagnosis not present

## 2020-10-24 ENCOUNTER — Inpatient Hospital Stay: Payer: Medicare Other | Attending: Hematology

## 2020-10-24 ENCOUNTER — Other Ambulatory Visit: Payer: Self-pay

## 2020-10-24 DIAGNOSIS — D473 Essential (hemorrhagic) thrombocythemia: Secondary | ICD-10-CM | POA: Diagnosis not present

## 2020-10-24 LAB — COMPREHENSIVE METABOLIC PANEL
ALT: 10 U/L (ref 0–44)
AST: 22 U/L (ref 15–41)
Albumin: 3.8 g/dL (ref 3.5–5.0)
Alkaline Phosphatase: 66 U/L (ref 38–126)
Anion gap: 11 (ref 5–15)
BUN: 9 mg/dL (ref 8–23)
CO2: 23 mmol/L (ref 22–32)
Calcium: 9.5 mg/dL (ref 8.9–10.3)
Chloride: 106 mmol/L (ref 98–111)
Creatinine, Ser: 1.21 mg/dL (ref 0.61–1.24)
GFR, Estimated: 59 mL/min — ABNORMAL LOW (ref >60)
Glucose, Bld: 118 mg/dL — ABNORMAL HIGH (ref 70–99)
Potassium: 3.7 mmol/L (ref 3.5–5.1)
Sodium: 140 mmol/L (ref 135–145)
Total Bilirubin: 0.5 mg/dL (ref 0.3–1.2)
Total Protein: 7.3 g/dL (ref 6.5–8.1)

## 2020-10-24 LAB — CBC WITH DIFFERENTIAL/PLATELET
Abs Immature Granulocytes: 0 10*3/uL (ref 0.00–0.07)
Basophils Absolute: 0.1 10*3/uL (ref 0.0–0.1)
Basophils Relative: 2 %
Eosinophils Absolute: 0.2 10*3/uL (ref 0.0–0.5)
Eosinophils Relative: 5 %
HCT: 37.7 % — ABNORMAL LOW (ref 39.0–52.0)
Hemoglobin: 12.4 g/dL — ABNORMAL LOW (ref 13.0–17.0)
Immature Granulocytes: 0 %
Lymphocytes Relative: 30 %
Lymphs Abs: 1.4 10*3/uL (ref 0.7–4.0)
MCH: 30.4 pg (ref 26.0–34.0)
MCHC: 32.9 g/dL (ref 30.0–36.0)
MCV: 92.4 fL (ref 80.0–100.0)
Monocytes Absolute: 0.4 10*3/uL (ref 0.1–1.0)
Monocytes Relative: 7 %
Neutro Abs: 2.7 10*3/uL (ref 1.7–7.7)
Neutrophils Relative %: 56 %
Platelets: 497 10*3/uL — ABNORMAL HIGH (ref 150–400)
RBC: 4.08 MIL/uL — ABNORMAL LOW (ref 4.22–5.81)
RDW: 16.5 % — ABNORMAL HIGH (ref 11.5–15.5)
WBC: 4.8 10*3/uL (ref 4.0–10.5)
nRBC: 0 % (ref 0.0–0.2)

## 2020-11-11 ENCOUNTER — Telehealth: Payer: Self-pay

## 2020-11-11 NOTE — Telephone Encounter (Signed)
This nurse called patient per Dr. Burr Medico related to lab results.  Left a message to return call to clinic.

## 2020-11-11 NOTE — Telephone Encounter (Signed)
-----   Message from Truitt Merle, MD sent at 11/09/2020  8:49 AM EDT ----- Please let pt know his lab result, plt remains to be aroung 500K, I recommend increase Hydrea by 500mg /week, he is currently on 1000mg  daiy MWF and 500mg  daily for the rest of week, so change to 500mg  daily MWF, and 1000mg  daily for the rest of week, thanks   Truitt Merle  11/09/2020

## 2020-12-19 ENCOUNTER — Encounter: Payer: Self-pay | Admitting: Hematology

## 2020-12-19 ENCOUNTER — Inpatient Hospital Stay: Payer: Medicare Other | Attending: Hematology

## 2020-12-19 ENCOUNTER — Other Ambulatory Visit: Payer: Self-pay

## 2020-12-19 ENCOUNTER — Inpatient Hospital Stay (HOSPITAL_BASED_OUTPATIENT_CLINIC_OR_DEPARTMENT_OTHER): Payer: Medicare Other | Admitting: Hematology

## 2020-12-19 VITALS — BP 156/86 | HR 85 | Temp 98.6°F | Resp 17 | Ht 69.0 in | Wt 158.1 lb

## 2020-12-19 DIAGNOSIS — D473 Essential (hemorrhagic) thrombocythemia: Secondary | ICD-10-CM

## 2020-12-19 DIAGNOSIS — R42 Dizziness and giddiness: Secondary | ICD-10-CM | POA: Diagnosis not present

## 2020-12-19 DIAGNOSIS — D472 Monoclonal gammopathy: Secondary | ICD-10-CM

## 2020-12-19 DIAGNOSIS — I10 Essential (primary) hypertension: Secondary | ICD-10-CM | POA: Insufficient documentation

## 2020-12-19 DIAGNOSIS — D509 Iron deficiency anemia, unspecified: Secondary | ICD-10-CM | POA: Diagnosis not present

## 2020-12-19 DIAGNOSIS — K219 Gastro-esophageal reflux disease without esophagitis: Secondary | ICD-10-CM | POA: Diagnosis not present

## 2020-12-19 DIAGNOSIS — Z79899 Other long term (current) drug therapy: Secondary | ICD-10-CM | POA: Diagnosis not present

## 2020-12-19 DIAGNOSIS — E785 Hyperlipidemia, unspecified: Secondary | ICD-10-CM | POA: Diagnosis not present

## 2020-12-19 DIAGNOSIS — Z8546 Personal history of malignant neoplasm of prostate: Secondary | ICD-10-CM | POA: Diagnosis not present

## 2020-12-19 DIAGNOSIS — E039 Hypothyroidism, unspecified: Secondary | ICD-10-CM | POA: Insufficient documentation

## 2020-12-19 DIAGNOSIS — Z7982 Long term (current) use of aspirin: Secondary | ICD-10-CM | POA: Insufficient documentation

## 2020-12-19 LAB — CBC WITH DIFFERENTIAL (CANCER CENTER ONLY)
Abs Immature Granulocytes: 0.01 10*3/uL (ref 0.00–0.07)
Basophils Absolute: 0.1 10*3/uL (ref 0.0–0.1)
Basophils Relative: 3 %
Eosinophils Absolute: 0.2 10*3/uL (ref 0.0–0.5)
Eosinophils Relative: 4 %
HCT: 37.3 % — ABNORMAL LOW (ref 39.0–52.0)
Hemoglobin: 12.1 g/dL — ABNORMAL LOW (ref 13.0–17.0)
Immature Granulocytes: 0 %
Lymphocytes Relative: 32 %
Lymphs Abs: 1.3 10*3/uL (ref 0.7–4.0)
MCH: 29.2 pg (ref 26.0–34.0)
MCHC: 32.4 g/dL (ref 30.0–36.0)
MCV: 90.1 fL (ref 80.0–100.0)
Monocytes Absolute: 0.4 10*3/uL (ref 0.1–1.0)
Monocytes Relative: 9 %
Neutro Abs: 2.1 10*3/uL (ref 1.7–7.7)
Neutrophils Relative %: 52 %
Platelet Count: 311 10*3/uL (ref 150–400)
RBC: 4.14 MIL/uL — ABNORMAL LOW (ref 4.22–5.81)
RDW: 16.2 % — ABNORMAL HIGH (ref 11.5–15.5)
WBC Count: 4.1 10*3/uL (ref 4.0–10.5)
nRBC: 0 % (ref 0.0–0.2)

## 2020-12-19 LAB — CMP (CANCER CENTER ONLY)
ALT: 19 U/L (ref 0–44)
AST: 28 U/L (ref 15–41)
Albumin: 4.2 g/dL (ref 3.5–5.0)
Alkaline Phosphatase: 54 U/L (ref 38–126)
Anion gap: 8 (ref 5–15)
BUN: 16 mg/dL (ref 8–23)
CO2: 23 mmol/L (ref 22–32)
Calcium: 9.5 mg/dL (ref 8.9–10.3)
Chloride: 106 mmol/L (ref 98–111)
Creatinine: 1.36 mg/dL — ABNORMAL HIGH (ref 0.61–1.24)
GFR, Estimated: 51 mL/min — ABNORMAL LOW (ref >60)
Glucose, Bld: 105 mg/dL — ABNORMAL HIGH (ref 70–99)
Potassium: 4.1 mmol/L (ref 3.5–5.1)
Sodium: 137 mmol/L (ref 135–145)
Total Bilirubin: 0.8 mg/dL (ref 0.3–1.2)
Total Protein: 7.2 g/dL (ref 6.5–8.1)

## 2020-12-19 NOTE — Progress Notes (Signed)
[place]   [phone] Fax:(336) [phone]   Clinic Follow up Note   Patient Care Team: Wenda Low, MD as PCP - General (Internal Medicine) Nobie Putnam, MD (Hematology and Oncology) Garlan Fair, MD as Attending Physician (Gastroenterology) Rana Snare, MD (Inactive) as Attending Physician (Urology)  Date of Service:  12/19/2020  CHIEF COMPLAINT: Follow up on essential thrombocytosis   PREVIOUS THERAPY: Hydrea 500 mg daily, from 08/2012 to 04/01/2015, stopped on 04/02/2015 due to recurrent episodes of dizziness, restarted on 06/08/2015. He tried anagrelide 0.55m bid for one month in 05/2015 which was not effective. Startled Hydrea 500 mg Monday-Saturday, hold on sundays due to anemia on 07/19/17, and decreased to 5027mdaily except Wednesday and Sunday on 12/06/2017, increased back to 50037maily except on Sundays in 01/2018. Dose further increased subsequently    CURRENT THERAPY:  Increased hydrea to 1000 mg MWF and 500 mg other days due to plt count >500k in July 2020   INTERVAL HISTORY:  Ricky Santos here for a follow up of ET.  He is doing well overall, still has dizziness spell on daily bases, it goes away when he sits down for 5-10 mins  No falls  No leg edema or SOB  No other complains  He was out of town last week and forgot to take hydrea for 4 days, restarted when he returned We called him on 6/14 to increase hydrea dose but he forgot to do it, he is still on 1000m72mF and 500mg69mly for rest of week    MEDICAL HISTORY:  Past Medical History:  Diagnosis Date   Back pain    Essential thrombocytosis (HCC) [place]014   JAK-2 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    Essential thrombocytosis (HCC) [place]8/2014   GERD (gastroesophageal reflux disease)    Hernia    Hyperlipidemia    Hypertension    Hypothyroid    [facility] SURGICAL HISTORY: Past Surgical History:  Procedure Laterality Date   BACK SURGERY      COLONOSCOPY  2003   COLONOSCOPY N/A 01/17/2013   Procedure: COLONOSCOPY;  Surgeon: MartiGarlan Fair  Location: [facility];  Service: Endoscopy;  Laterality: N/A;   exp lap for trauma     HERNIA REPAIR     INCISION AND DRAINAGE ABSCESS ANAL  08/01/05   PROSTATE SURGERY     SHOULDER SURGERY     rt    I have reviewed the social history and family history with the patient and they are unchanged from previous note.  ALLERGIES:  has No Known Allergies.  MEDICATIONS:  Current Outpatient Medications  Medication Sig Dispense Refill   aspirin 81 MG tablet Take 81 mg by mouth daily.       atorvastatin (LIPITOR) 40 MG tablet Take 40 mg by mouth daily.       diltiazem (CARDIZEM CD) 240 MG 24 hr capsule Take 240 mg by mouth daily.       finasteride (PROSCAR) 5 MG tablet Take 5 mg by mouth daily.       hydrochlorothiazide 25 MG tablet Take 25 mg by mouth daily.       hydroxyurea (HYDREA) 500 MG capsule Take 1 capsule (500 mg total) by mouth daily. Take 2 capsules daily on Mon, Wed, Fri, and 1 capsule daily for the rest of week 130 capsule 1   levothyroxine (SYNTHROID, LEVOTHROID) 25 MCG tablet Take 25 mcg by mouth daily.  lisinopril (PRINIVIL,ZESTRIL) 20 MG tablet Take 20 mg by mouth daily.  6   meclizine (ANTIVERT) 25 MG tablet Take 25 mg by mouth as needed. Reported on 11/11/2015  3   mirtazapine (REMERON) 15 MG tablet Take 15 mg by mouth at bedtime.      pantoprazole (PROTONIX) 40 MG tablet Take 40 mg by mouth daily.       psyllium (METAMUCIL) 58.6 % powder Take 1 packet by mouth 3 (three) times daily. Reported on 11/11/2015     No current facility-administered medications for this visit.    PHYSICAL EXAMINATION: ECOG PERFORMANCE STATUS: 1 - Symptomatic but completely ambulatory  Vitals:   12/19/20 1016  BP: (!) 156/86  Pulse: 85  Resp: 17  Temp: 98.6 F (37 C)  SpO2: 100%    Filed Weights   12/19/20 1016  Weight: 158 lb 1.6 oz (71.7 kg)     GENERAL:alert, no  distress and comfortable SKIN: skin color, texture, turgor are normal, no rashes or significant lesions EYES: normal, Conjunctiva are pink and non-injected, sclera clear Musculoskeletal:no cyanosis of digits and no clubbing  NEURO: alert & oriented x 3 with fluent speech, no focal motor/sensory deficits  LABORATORY DATA:  I have reviewed the data as listed CBC Latest Ref Rng & Units 12/19/2020 10/24/2020 08/22/2020  WBC 4.0 - 10.5 K/uL 4.1 4.8 5.9  Hemoglobin 13.0 - 17.0 g/dL 12.1(L) 12.4(L) 13.2  Hematocrit 39.0 - 52.0 % 37.3(L) 37.7(L) 41.5  Platelets 150 - 400 K/uL 311 497(H) 508(H)     CMP Latest Ref Rng & Units 12/19/2020 10/24/2020 08/22/2020  Glucose 70 - 99 mg/dL 105(H) 118(H) 110(H)  BUN 8 - 23 mg/dL 16 9 12  Creatinine 0.61 - 1.24 mg/dL 1.36(H) 1.21 1.24  Sodium 135 - 145 mmol/L 137 140 141  Potassium 3.5 - 5.1 mmol/L 4.1 3.7 4.2  Chloride 98 - 111 mmol/L 106 106 105  CO2 22 - 32 mmol/L 23 23 24  Calcium 8.9 - 10.3 mg/dL 9.5 9.5 9.4  Total Protein 6.5 - 8.1 g/dL 7.2 7.3 7.5  Total Bilirubin 0.3 - 1.2 mg/dL 0.8 0.5 0.7  Alkaline Phos 38 - 126 U/L 54 66 64  AST 15 - 41 U/L 28 22 29  ALT 0 - 44 U/L 19 10 20      RADIOGRAPHIC STUDIES: I have personally reviewed the radiological images as listed and agreed with the findings in the report. No results found.   ASSESSMENT & PLAN:  Ricky Santos is a 87 y.o. male with    1. Essential Thrombocytosis. JAK2(+) -He was diagnosed in March 2014, initial platelet count in the 500k range, JAK2 mutation (+), he did not have bone marrow biopsy.  -We previously discussed that this is a myeloproliferative disorder, people usually do very well, and there is small percentage pts will develop myelofibrosis and AML late on.  -He is currently on Hydrea to 1000 mg MWF and 500 mg other days since 2020. He is tolerating well.  -Lab reviewed, hemoglobin 12.1, platelet 311, creatinine 1.36, no other concerns -We will continue same dose of  Hydrea -Repeat lab in 3 months   2. Anemia of iron deficiency  -Etiology of iron deficiency remains unclear. His EGD and colonoscopy with Dr. Schooler in 03/2018 were benign  -Continue oral ferrous sulfate once daily  -anemia and iron panel resolved in 07/2019, will continue to monitor    3. MGUS -M spike 0.2 with IgG monoclonal protein with lambda light chain specificity   on 12/06/17.  -Cr on 10/11/17 was elevated to 1.34, normal previously; has normalized on 02/14/18 -01/24/18 bone survey is negative for lytic lesions  -Last M-Protein in 12/2019 was not observed. Light chain was stable. Will continue monitoring MM panel every 6 months.     Plan -He is clinically doing well and stable  -Continue Hydrea to 1044m daily on MWF and 5081mdaily for  the rest of the week -Lab in 3 months  -Lab and F/u in 6 months    No problem-specific Assessment & Plan notes found for this encounter.   Orders Placed This Encounter  Procedures   CBC with Differential ([place])    Standing Status:   Standing    Number of Occurrences:   10    Standing Expiration Date:   12/19/2021    All questions were answered. The patient knows to call the clinic with any problems, questions or concerns. No barriers to learning was detected. The total time spent in the appointment was 20 minutes.     YaTruitt MerleMD 12/19/2020

## 2020-12-20 ENCOUNTER — Telehealth: Payer: Self-pay | Admitting: Hematology

## 2020-12-20 NOTE — Telephone Encounter (Signed)
Left message with follow-up appointments per 7/21 los. 

## 2020-12-23 ENCOUNTER — Telehealth: Payer: Self-pay

## 2020-12-23 NOTE — Telephone Encounter (Signed)
This nurse contacted patient and made aware of results and recommendations per Dr. Burr Medico.  Patient acknowledged understanding. Patient is aware of next appointment dates and times.  No further questions or concerns at this time.  Patient knows to call clinic with any questions, concerns or problems.

## 2021-02-10 DIAGNOSIS — C61 Malignant neoplasm of prostate: Secondary | ICD-10-CM | POA: Diagnosis not present

## 2021-02-18 DIAGNOSIS — N401 Enlarged prostate with lower urinary tract symptoms: Secondary | ICD-10-CM | POA: Diagnosis not present

## 2021-02-18 DIAGNOSIS — R351 Nocturia: Secondary | ICD-10-CM | POA: Diagnosis not present

## 2021-02-18 DIAGNOSIS — C61 Malignant neoplasm of prostate: Secondary | ICD-10-CM | POA: Diagnosis not present

## 2021-03-21 ENCOUNTER — Inpatient Hospital Stay: Payer: Medicare Other | Attending: Hematology

## 2021-03-21 ENCOUNTER — Other Ambulatory Visit: Payer: Self-pay

## 2021-03-21 DIAGNOSIS — D473 Essential (hemorrhagic) thrombocythemia: Secondary | ICD-10-CM | POA: Insufficient documentation

## 2021-03-21 DIAGNOSIS — D649 Anemia, unspecified: Secondary | ICD-10-CM

## 2021-03-21 DIAGNOSIS — D472 Monoclonal gammopathy: Secondary | ICD-10-CM

## 2021-03-21 LAB — CBC WITH DIFFERENTIAL (CANCER CENTER ONLY)
Abs Immature Granulocytes: 0.02 10*3/uL (ref 0.00–0.07)
Basophils Absolute: 0.1 10*3/uL (ref 0.0–0.1)
Basophils Relative: 2 %
Eosinophils Absolute: 0.3 10*3/uL (ref 0.0–0.5)
Eosinophils Relative: 5 %
HCT: 37.4 % — ABNORMAL LOW (ref 39.0–52.0)
Hemoglobin: 11.9 g/dL — ABNORMAL LOW (ref 13.0–17.0)
Immature Granulocytes: 0 %
Lymphocytes Relative: 29 %
Lymphs Abs: 1.7 10*3/uL (ref 0.7–4.0)
MCH: 27.5 pg (ref 26.0–34.0)
MCHC: 31.8 g/dL (ref 30.0–36.0)
MCV: 86.4 fL (ref 80.0–100.0)
Monocytes Absolute: 0.4 10*3/uL (ref 0.1–1.0)
Monocytes Relative: 7 %
Neutro Abs: 3.3 10*3/uL (ref 1.7–7.7)
Neutrophils Relative %: 57 %
Platelet Count: 641 10*3/uL — ABNORMAL HIGH (ref 150–400)
RBC: 4.33 MIL/uL (ref 4.22–5.81)
RDW: 16.3 % — ABNORMAL HIGH (ref 11.5–15.5)
WBC Count: 5.8 10*3/uL (ref 4.0–10.5)
nRBC: 0 % (ref 0.0–0.2)

## 2021-03-21 LAB — CMP (CANCER CENTER ONLY)
ALT: 15 U/L (ref 0–44)
AST: 26 U/L (ref 15–41)
Albumin: 4.3 g/dL (ref 3.5–5.0)
Alkaline Phosphatase: 58 U/L (ref 38–126)
Anion gap: 8 (ref 5–15)
BUN: 11 mg/dL (ref 8–23)
CO2: 24 mmol/L (ref 22–32)
Calcium: 9.7 mg/dL (ref 8.9–10.3)
Chloride: 107 mmol/L (ref 98–111)
Creatinine: 1.24 mg/dL (ref 0.61–1.24)
GFR, Estimated: 57 mL/min — ABNORMAL LOW (ref >60)
Glucose, Bld: 89 mg/dL (ref 70–99)
Potassium: 3.7 mmol/L (ref 3.5–5.1)
Sodium: 139 mmol/L (ref 135–145)
Total Bilirubin: 0.5 mg/dL (ref 0.3–1.2)
Total Protein: 7.8 g/dL (ref 6.5–8.1)

## 2021-03-21 LAB — FERRITIN: Ferritin: 21 ng/mL — ABNORMAL LOW (ref 24–336)

## 2021-03-24 LAB — PROTEIN ELECTROPHORESIS, SERUM, WITH REFLEX
A/G Ratio: 1.4 (ref 0.7–1.7)
Albumin ELP: 4.1 g/dL (ref 2.9–4.4)
Alpha-1-Globulin: 0.2 g/dL (ref 0.0–0.4)
Alpha-2-Globulin: 0.8 g/dL (ref 0.4–1.0)
Beta Globulin: 1 g/dL (ref 0.7–1.3)
Gamma Globulin: 1 g/dL (ref 0.4–1.8)
Globulin, Total: 3 g/dL (ref 2.2–3.9)
Total Protein ELP: 7.1 g/dL (ref 6.0–8.5)

## 2021-03-24 LAB — KAPPA/LAMBDA LIGHT CHAINS
Kappa free light chain: 42.6 mg/L — ABNORMAL HIGH (ref 3.3–19.4)
Kappa, lambda light chain ratio: 1.89 — ABNORMAL HIGH (ref 0.26–1.65)
Lambda free light chains: 22.5 mg/L (ref 5.7–26.3)

## 2021-03-26 ENCOUNTER — Telehealth: Payer: Self-pay

## 2021-03-26 ENCOUNTER — Other Ambulatory Visit: Payer: Self-pay

## 2021-03-26 DIAGNOSIS — D649 Anemia, unspecified: Secondary | ICD-10-CM

## 2021-03-26 NOTE — Telephone Encounter (Signed)
-----   Message from Truitt Merle, MD sent at 03/24/2021  9:22 PM EDT ----- Please let pt know his M-protien was negative and light chain level stable, no new concerns, thanks   Truitt Merle  03/24/2021

## 2021-03-26 NOTE — Progress Notes (Signed)
Spoke with pt via telephone stating M-Protein was negative and light chain levels stable; therefore, no additional new concerns per Dr. Burr Medico.  Also, informed pt that his iron was low and that Dr. Burr Medico would like for him to start taking Ferrous Sulfate OTC.  Educated pt on that Iron Supplement can cause constipation.  Recommended increasing his fluid intake, stool softner, and high fiber foods.  Pt verbalized understanding of teaching.    Ordered Iron Panel & TIBC lab in 6wks per Dr. Ernestina Penna request.

## 2021-03-26 NOTE — Telephone Encounter (Signed)
Called and left a message on Mr. Bouch cell phone with his lab results and left our main phone number to call back if he had any questions or concerns. Gardiner Rhyme, RN

## 2021-03-26 NOTE — Progress Notes (Signed)
Ordered iron panel & TIBC sent message to scheduling to schedule the pt to return in 6wks for labs & Dr. Burr Medico.

## 2021-03-26 NOTE — Telephone Encounter (Signed)
See telephone note by Sherilyn Cooter, BSN RN dated 03/26/2021

## 2021-03-28 ENCOUNTER — Telehealth: Payer: Self-pay | Admitting: Hematology

## 2021-03-28 NOTE — Telephone Encounter (Signed)
Scheduled per sch msg. Called and spoke with patient. Confirmed appt  

## 2021-04-11 ENCOUNTER — Other Ambulatory Visit: Payer: Self-pay | Admitting: Hematology

## 2021-04-11 DIAGNOSIS — D473 Essential (hemorrhagic) thrombocythemia: Secondary | ICD-10-CM

## 2021-05-01 DIAGNOSIS — I10 Essential (primary) hypertension: Secondary | ICD-10-CM | POA: Diagnosis not present

## 2021-05-01 DIAGNOSIS — C61 Malignant neoplasm of prostate: Secondary | ICD-10-CM | POA: Diagnosis not present

## 2021-05-01 DIAGNOSIS — J440 Chronic obstructive pulmonary disease with acute lower respiratory infection: Secondary | ICD-10-CM | POA: Diagnosis not present

## 2021-05-01 DIAGNOSIS — E782 Mixed hyperlipidemia: Secondary | ICD-10-CM | POA: Diagnosis not present

## 2021-05-08 ENCOUNTER — Encounter: Payer: Self-pay | Admitting: Hematology

## 2021-05-08 ENCOUNTER — Other Ambulatory Visit: Payer: Self-pay

## 2021-05-08 ENCOUNTER — Inpatient Hospital Stay: Payer: Medicare Other

## 2021-05-08 ENCOUNTER — Inpatient Hospital Stay: Payer: Medicare Other | Attending: Hematology | Admitting: Hematology

## 2021-05-08 VITALS — BP 169/84 | HR 82 | Temp 98.6°F | Resp 18 | Ht 69.0 in | Wt 160.2 lb

## 2021-05-08 DIAGNOSIS — E039 Hypothyroidism, unspecified: Secondary | ICD-10-CM | POA: Insufficient documentation

## 2021-05-08 DIAGNOSIS — K219 Gastro-esophageal reflux disease without esophagitis: Secondary | ICD-10-CM | POA: Diagnosis not present

## 2021-05-08 DIAGNOSIS — I10 Essential (primary) hypertension: Secondary | ICD-10-CM | POA: Insufficient documentation

## 2021-05-08 DIAGNOSIS — D472 Monoclonal gammopathy: Secondary | ICD-10-CM | POA: Insufficient documentation

## 2021-05-08 DIAGNOSIS — D509 Iron deficiency anemia, unspecified: Secondary | ICD-10-CM | POA: Diagnosis not present

## 2021-05-08 DIAGNOSIS — Z79899 Other long term (current) drug therapy: Secondary | ICD-10-CM | POA: Diagnosis not present

## 2021-05-08 DIAGNOSIS — E785 Hyperlipidemia, unspecified: Secondary | ICD-10-CM | POA: Insufficient documentation

## 2021-05-08 DIAGNOSIS — Z7982 Long term (current) use of aspirin: Secondary | ICD-10-CM | POA: Diagnosis not present

## 2021-05-08 DIAGNOSIS — D473 Essential (hemorrhagic) thrombocythemia: Secondary | ICD-10-CM | POA: Diagnosis not present

## 2021-05-08 DIAGNOSIS — Z8546 Personal history of malignant neoplasm of prostate: Secondary | ICD-10-CM | POA: Diagnosis not present

## 2021-05-08 DIAGNOSIS — D649 Anemia, unspecified: Secondary | ICD-10-CM

## 2021-05-08 LAB — CMP (CANCER CENTER ONLY)
ALT: 8 U/L (ref 0–44)
AST: 19 U/L (ref 15–41)
Albumin: 4 g/dL (ref 3.5–5.0)
Alkaline Phosphatase: 59 U/L (ref 38–126)
Anion gap: 10 (ref 5–15)
BUN: 12 mg/dL (ref 8–23)
CO2: 22 mmol/L (ref 22–32)
Calcium: 9 mg/dL (ref 8.9–10.3)
Chloride: 109 mmol/L (ref 98–111)
Creatinine: 1.23 mg/dL (ref 0.61–1.24)
GFR, Estimated: 58 mL/min — ABNORMAL LOW (ref >60)
Glucose, Bld: 83 mg/dL (ref 70–99)
Potassium: 4.2 mmol/L (ref 3.5–5.1)
Sodium: 141 mmol/L (ref 135–145)
Total Bilirubin: 0.4 mg/dL (ref 0.3–1.2)
Total Protein: 7.3 g/dL (ref 6.5–8.1)

## 2021-05-08 LAB — CBC WITH DIFFERENTIAL (CANCER CENTER ONLY)
Abs Immature Granulocytes: 0.02 10*3/uL (ref 0.00–0.07)
Basophils Absolute: 0.1 10*3/uL (ref 0.0–0.1)
Basophils Relative: 2 %
Eosinophils Absolute: 0.3 10*3/uL (ref 0.0–0.5)
Eosinophils Relative: 5 %
HCT: 39.3 % (ref 39.0–52.0)
Hemoglobin: 12.6 g/dL — ABNORMAL LOW (ref 13.0–17.0)
Immature Granulocytes: 0 %
Lymphocytes Relative: 24 %
Lymphs Abs: 1.5 10*3/uL (ref 0.7–4.0)
MCH: 29.4 pg (ref 26.0–34.0)
MCHC: 32.1 g/dL (ref 30.0–36.0)
MCV: 91.6 fL (ref 80.0–100.0)
Monocytes Absolute: 0.4 10*3/uL (ref 0.1–1.0)
Monocytes Relative: 7 %
Neutro Abs: 3.9 10*3/uL (ref 1.7–7.7)
Neutrophils Relative %: 62 %
Platelet Count: 573 10*3/uL — ABNORMAL HIGH (ref 150–400)
RBC: 4.29 MIL/uL (ref 4.22–5.81)
RDW: 18 % — ABNORMAL HIGH (ref 11.5–15.5)
WBC Count: 6.3 10*3/uL (ref 4.0–10.5)
nRBC: 0 % (ref 0.0–0.2)

## 2021-05-08 LAB — IRON AND TIBC
Iron: 317 ug/dL — ABNORMAL HIGH (ref 42–163)
Saturation Ratios: 97 % — ABNORMAL HIGH (ref 20–55)
TIBC: 328 ug/dL (ref 202–409)
UIBC: 11 ug/dL — ABNORMAL LOW (ref 117–376)

## 2021-05-08 MED ORDER — HYDROXYUREA 500 MG PO CAPS: ORAL_CAPSULE | ORAL | 0 refills | Status: DC

## 2021-05-08 NOTE — Progress Notes (Signed)
[place]   [phone] Fax:(336) [phone]   Clinic Follow up Note   Patient Care Team: Wenda Low, MD as PCP - General (Internal Medicine) Nobie Putnam, MD (Hematology and Oncology) Garlan Fair, MD as Attending Physician (Gastroenterology) Rana Snare, MD (Inactive) as Attending Physician (Urology)  Date of Service:  05/08/2021  CHIEF COMPLAINT: f/u of essential thrombosis  PREVIOUS THERAPY: Hydrea 500 mg daily, from 08/2012 to 04/01/2015, stopped on 04/02/2015 due to recurrent episodes of dizziness, restarted on 06/08/2015. He tried anagrelide 0.5mg  bid for one month in 05/2015 which was not effective. Startled Hydrea 500 mg Monday-Saturday, hold on sundays due to anemia on 07/19/17, and decreased to 500mg  daily except Wednesday and Sunday on 12/06/2017, increased back to 500mg  daily except on Sundays in 01/2018. Dose further increased subsequently    CURRENT THERAPY:  Increased hydrea to 1000 mg MWF and 500 mg other days due to plt count >500k in July 2020   ASSESSMENT & PLAN:  Ricky Santos is a 87 y.o. male with   1. Essential Thrombocytosis. JAK2(+) -He was diagnosed in March 2014, initial platelet count in the 500k range, JAK2 mutation (+), he did not have bone marrow biopsy.  -We previously discussed that this is a myeloproliferative disorder, people usually do very well, and there is small percentage pts will develop myelofibrosis and AML late on.  -He is currently on Hydrea to 1000 mg MWF and 500 mg other days. He is tolerating well.  -Lab reviewed, hemoglobin 12.6, platelet 573. He notes he does sometimes forget some doses. -We will increase Hydra by 500mg /week  -Repeat lab in 1, 3, and 5 months   2. Anemia of iron deficiency  -Etiology of iron deficiency remains unclear. His EGD and colonoscopy with Dr. Michail Sermon in 03/2018 were benign  -Continue oral ferrous sulfate once daily  -anemia and iron panel resolved in 07/2019 -ferritin down to 21  03/2021, iron panel today pending.   3. MGUS -M spike 0.2 with IgG monoclonal protein with lambda light chain specificity on 12/06/17.  -Cr on 10/11/17 was elevated to 1.34, normal previously; has normalized on 02/14/18 -01/24/18 bone survey is negative for lytic lesions  -Last M-Protein in 03/2021 was not observed. Light chain was stable. Will continue monitoring MM panel every 6 months.      Plan -Continue Hydrea, increase to 500mg  daily on MWF and 1000mg  daily for the rest of the week -cancel 06/20/21 appointments -Lab in 1 and 3 months -Lab and F/u in 5 months    No problem-specific Assessment & Plan notes found for this encounter.   INTERVAL HISTORY:  Ricky Santos is here for a follow up of ET. He was last seen by me on 12/19/20. He presents to the clinic alone. He reports he is doing well overall. He does note he sometimes forgets to take the hydrea. I wrote out the schedule for him. He notes his prior episodes of dizziness have improved since his recent eye exam.   All other systems were reviewed with the patient and are negative.  MEDICAL HISTORY:  Past Medical History:  Diagnosis Date   Back pain    Essential thrombocytosis ([facility]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    Essential thrombocytosis ([facility]) 08/26/2012   GERD (gastroesophageal reflux disease)    Hernia    Hyperlipidemia    Hypertension    Hypothyroid    Prostate cancer ([place])     SURGICAL HISTORY: Past  Surgical History:  Procedure Laterality Date   BACK SURGERY     COLONOSCOPY  2003   COLONOSCOPY N/A 01/17/2013   Procedure: COLONOSCOPY;  Surgeon: Garlan Fair, MD;  Location: [facility];  Service: Endoscopy;  Laterality: N/A;   exp lap for trauma     HERNIA REPAIR     INCISION AND DRAINAGE ABSCESS ANAL  08/01/05   PROSTATE SURGERY     SHOULDER SURGERY     rt    I have reviewed the social history and family history with the patient and they are unchanged from previous  note.  ALLERGIES:  has No Known Allergies.  MEDICATIONS:  Current Outpatient Medications  Medication Sig Dispense Refill   aspirin 81 MG tablet Take 81 mg by mouth daily.       atorvastatin (LIPITOR) 40 MG tablet Take 40 mg by mouth daily.       diltiazem (CARDIZEM CD) 240 MG 24 hr capsule Take 240 mg by mouth daily.       finasteride (PROSCAR) 5 MG tablet Take 5 mg by mouth daily.       hydrochlorothiazide 25 MG tablet Take 25 mg by mouth daily.       hydroxyurea (HYDREA) 500 MG capsule Take 1 tabs on Monday, Wednesday and Friday and 2 tabs daily for the rest of week. May take with food to minimize GI side effects. 150 capsule 0   levothyroxine (SYNTHROID, LEVOTHROID) 25 MCG tablet Take 25 mcg by mouth daily.       lisinopril (PRINIVIL,ZESTRIL) 20 MG tablet Take 20 mg by mouth daily.  6   meclizine (ANTIVERT) 25 MG tablet Take 25 mg by mouth as needed. Reported on 11/11/2015  3   mirtazapine (REMERON) 15 MG tablet Take 15 mg by mouth at bedtime.      pantoprazole (PROTONIX) 40 MG tablet Take 40 mg by mouth daily.       psyllium (METAMUCIL) 58.6 % powder Take 1 packet by mouth 3 (three) times daily. Reported on 11/11/2015     No current facility-administered medications for this visit.    PHYSICAL EXAMINATION: ECOG PERFORMANCE STATUS: 1 - Symptomatic but completely ambulatory  Vitals:   05/08/21 1401  BP: (!) 169/84  Pulse: 82  Resp: 18  Temp: 98.6 F (37 C)  SpO2: 98%   Wt Readings from Last 3 Encounters:  05/08/21 72.7 kg  12/19/20 71.7 kg  08/22/20 70.7 kg     GENERAL:alert, no distress and comfortable SKIN: skin color normal, no rashes or significant lesions EYES: normal, Conjunctiva are pink and non-injected, sclera clear  NEURO: alert & oriented x 3 with fluent speech  LABORATORY DATA:  I have reviewed the data as listed CBC Latest Ref Rng & Units 05/08/2021 03/21/2021 12/19/2020  WBC 4.0 - 10.5 K/uL 6.3 5.8 4.1  Hemoglobin 13.0 - 17.0 g/dL 12.6(L) 11.9(L) 12.1(L)   Hematocrit 39.0 - 52.0 % 39.3 37.4(L) 37.3(L)  Platelets 150 - 400 K/uL 573(H) 641(H) 311     CMP Latest Ref Rng & Units 05/08/2021 03/21/2021 12/19/2020  Glucose 70 - 99 mg/dL 83 89 105(H)  BUN 8 - 23 mg/dL $Remove'12 11 16  'NltCCOJ$ Creatinine 0.61 - 1.24 mg/dL 1.23 1.24 1.36(H)  Sodium 135 - 145 mmol/L 141 139 137  Potassium 3.5 - 5.1 mmol/L 4.2 3.7 4.1  Chloride 98 - 111 mmol/L 109 107 106  CO2 22 - 32 mmol/L $RemoveB'22 24 23  'qcAcaPnR$ Calcium 8.9 - 10.3 mg/dL 9.0 9.7 9.5  Total Protein  6.5 - 8.1 g/dL 7.3 7.8 7.2  Total Bilirubin 0.3 - 1.2 mg/dL 0.4 0.5 0.8  Alkaline Phos 38 - 126 U/L 59 58 54  AST 15 - 41 U/L $Remo'19 26 28  'FOHuQ$ ALT 0 - 44 U/L $Remo'8 15 19      'kgCms$ RADIOGRAPHIC STUDIES: I have personally reviewed the radiological images as listed and agreed with the findings in the report. No results found.    No orders of the defined types were placed in this encounter.  All questions were answered. The patient knows to call the clinic with any problems, questions or concerns. No barriers to learning was detected. The total time spent in the appointment was 25 minutes.     Truitt Merle, MD 05/08/2021   I, Wilburn Mylar, am acting as scribe for Truitt Merle, MD.   I have reviewed the above documentation for accuracy and completeness, and I agree with the above.

## 2021-06-04 ENCOUNTER — Observation Stay (HOSPITAL_COMMUNITY)
Admission: EM | Admit: 2021-06-04 | Discharge: 2021-06-06 | Disposition: A | Discharge status: Home / Self Care | Payer: Medicare (Managed Care) | Attending: Family Medicine | Admitting: Family Medicine

## 2021-06-04 ENCOUNTER — Encounter (HOSPITAL_COMMUNITY): Payer: Self-pay | Admitting: Emergency Medicine

## 2021-06-04 ENCOUNTER — Other Ambulatory Visit: Payer: Self-pay

## 2021-06-04 ENCOUNTER — Emergency Department (HOSPITAL_COMMUNITY): Payer: Medicare (Managed Care)

## 2021-06-04 DIAGNOSIS — I10 Essential (primary) hypertension: Secondary | ICD-10-CM

## 2021-06-04 DIAGNOSIS — H8110 Benign paroxysmal vertigo, unspecified ear: Secondary | ICD-10-CM | POA: Diagnosis not present

## 2021-06-04 DIAGNOSIS — E039 Hypothyroidism, unspecified: Secondary | ICD-10-CM | POA: Diagnosis not present

## 2021-06-04 DIAGNOSIS — F17210 Nicotine dependence, cigarettes, uncomplicated: Secondary | ICD-10-CM | POA: Diagnosis not present

## 2021-06-04 DIAGNOSIS — R42 Dizziness and giddiness: Secondary | ICD-10-CM | POA: Diagnosis not present

## 2021-06-04 DIAGNOSIS — R29818 Other symptoms and signs involving the nervous system: Secondary | ICD-10-CM | POA: Diagnosis not present

## 2021-06-04 DIAGNOSIS — I629 Nontraumatic intracranial hemorrhage, unspecified: Secondary | ICD-10-CM | POA: Diagnosis not present

## 2021-06-04 DIAGNOSIS — Z79899 Other long term (current) drug therapy: Secondary | ICD-10-CM | POA: Insufficient documentation

## 2021-06-04 DIAGNOSIS — D472 Monoclonal gammopathy: Secondary | ICD-10-CM

## 2021-06-04 DIAGNOSIS — Z8546 Personal history of malignant neoplasm of prostate: Secondary | ICD-10-CM | POA: Diagnosis not present

## 2021-06-04 DIAGNOSIS — D509 Iron deficiency anemia, unspecified: Secondary | ICD-10-CM | POA: Diagnosis present

## 2021-06-04 DIAGNOSIS — Z7982 Long term (current) use of aspirin: Secondary | ICD-10-CM | POA: Insufficient documentation

## 2021-06-04 DIAGNOSIS — Z20822 Contact with and (suspected) exposure to covid-19: Secondary | ICD-10-CM | POA: Insufficient documentation

## 2021-06-04 DIAGNOSIS — I6782 Cerebral ischemia: Secondary | ICD-10-CM | POA: Diagnosis not present

## 2021-06-04 DIAGNOSIS — H8120 Vestibular neuronitis, unspecified ear: Secondary | ICD-10-CM

## 2021-06-04 DIAGNOSIS — I6381 Other cerebral infarction due to occlusion or stenosis of small artery: Secondary | ICD-10-CM | POA: Diagnosis not present

## 2021-06-04 DIAGNOSIS — R519 Headache, unspecified: Secondary | ICD-10-CM | POA: Diagnosis not present

## 2021-06-04 DIAGNOSIS — D473 Essential (hemorrhagic) thrombocythemia: Secondary | ICD-10-CM | POA: Diagnosis present

## 2021-06-04 LAB — CBC WITH DIFFERENTIAL/PLATELET
Abs Immature Granulocytes: 0.02 10*3/uL (ref 0.00–0.07)
Basophils Absolute: 0.1 10*3/uL (ref 0.0–0.1)
Basophils Relative: 1 %
Eosinophils Absolute: 0.1 10*3/uL (ref 0.0–0.5)
Eosinophils Relative: 2 %
HCT: 38.4 % — ABNORMAL LOW (ref 39.0–52.0)
Hemoglobin: 12.7 g/dL — ABNORMAL LOW (ref 13.0–17.0)
Immature Granulocytes: 0 %
Lymphocytes Relative: 20 %
Lymphs Abs: 1.1 10*3/uL (ref 0.7–4.0)
MCH: 31.7 pg (ref 26.0–34.0)
MCHC: 33.1 g/dL (ref 30.0–36.0)
MCV: 95.8 fL (ref 80.0–100.0)
Monocytes Absolute: 0.4 10*3/uL (ref 0.1–1.0)
Monocytes Relative: 7 %
Neutro Abs: 4 10*3/uL (ref 1.7–7.7)
Neutrophils Relative %: 70 %
Platelets: 456 10*3/uL — ABNORMAL HIGH (ref 150–400)
RBC: 4.01 MIL/uL — ABNORMAL LOW (ref 4.22–5.81)
RDW: 19.5 % — ABNORMAL HIGH (ref 11.5–15.5)
WBC: 5.7 10*3/uL (ref 4.0–10.5)
nRBC: 0 % (ref 0.0–0.2)

## 2021-06-04 LAB — COMPREHENSIVE METABOLIC PANEL
ALT: 13 U/L (ref 0–44)
AST: 20 U/L (ref 15–41)
Albumin: 4.1 g/dL (ref 3.5–5.0)
Alkaline Phosphatase: 59 U/L (ref 38–126)
Anion gap: 7 (ref 5–15)
BUN: 14 mg/dL (ref 8–23)
CO2: 23 mmol/L (ref 22–32)
Calcium: 9.5 mg/dL (ref 8.9–10.3)
Chloride: 107 mmol/L (ref 98–111)
Creatinine, Ser: 1.21 mg/dL (ref 0.61–1.24)
GFR, Estimated: 59 mL/min — ABNORMAL LOW (ref >60)
Glucose, Bld: 123 mg/dL — ABNORMAL HIGH (ref 70–99)
Potassium: 3.7 mmol/L (ref 3.5–5.1)
Sodium: 137 mmol/L (ref 135–145)
Total Bilirubin: 0.8 mg/dL (ref 0.3–1.2)
Total Protein: 7.5 g/dL (ref 6.5–8.1)

## 2021-06-04 LAB — RESP PANEL BY RT-PCR (FLU A&B, COVID) ARPGX2
Influenza A by PCR: NEGATIVE
Influenza B by PCR: NEGATIVE
SARS Coronavirus 2 by RT PCR: NEGATIVE

## 2021-06-04 MED ORDER — ACETAMINOPHEN 325 MG PO TABS: 650.0000 mg | ORAL_TABLET | Freq: Four times a day (QID) | ORAL | Status: DC | PRN

## 2021-06-04 MED ORDER — PREDNISONE 20 MG PO TABS
60.0000 mg | ORAL_TABLET | Freq: Every day | ORAL | Status: DC
  Administered 2021-06-05 – 2021-06-06 (×2): 60 mg via ORAL
  Filled 2021-06-04 (×2): qty 3

## 2021-06-04 MED ORDER — MECLIZINE HCL 25 MG PO TABS: 25.0000 mg | ORAL_TABLET | Freq: Three times a day (TID) | ORAL | 0 refills | Status: AC | PRN

## 2021-06-04 MED ORDER — ACETAMINOPHEN 650 MG RE SUPP: 650.0000 mg | Freq: Four times a day (QID) | RECTAL | Status: DC | PRN

## 2021-06-04 MED ORDER — ONDANSETRON HCL 4 MG/2ML IJ SOLN: 4.0000 mg | Freq: Four times a day (QID) | INTRAMUSCULAR | Status: DC | PRN

## 2021-06-04 MED ORDER — ENOXAPARIN SODIUM 40 MG/0.4ML IJ SOSY
40.0000 mg | PREFILLED_SYRINGE | Freq: Every day | INTRAMUSCULAR | Status: DC
  Administered 2021-06-05 (×2): 40 mg via SUBCUTANEOUS
  Filled 2021-06-04 (×2): qty 0.4

## 2021-06-04 MED ORDER — MECLIZINE HCL 25 MG PO TABS: 25.0000 mg | ORAL_TABLET | Freq: Once | ORAL | Status: DC

## 2021-06-04 MED ORDER — MECLIZINE HCL 25 MG PO TABS
25.0000 mg | ORAL_TABLET | Freq: Three times a day (TID) | ORAL | Status: DC
  Administered 2021-06-05 – 2021-06-06 (×4): 25 mg via ORAL
  Filled 2021-06-04 (×4): qty 1

## 2021-06-04 MED ORDER — MECLIZINE HCL 25 MG PO TABS
25.0000 mg | ORAL_TABLET | Freq: Once | ORAL | Status: AC
  Administered 2021-06-04: 25 mg via ORAL
  Filled 2021-06-04: qty 1

## 2021-06-04 NOTE — Discharge Instructions (Signed)
Take the meclizine as needed for dizziness.  Follow-up with your doctor to be rechecked.  Fortunately the MRI tonight did not show any signs of an acute stroke.

## 2021-06-04 NOTE — ED Provider Notes (Addendum)
[facility] DEPT Provider Note   CSN: [id number] Arrival date & time: 06/04/21  0827     History  Chief Complaint  Patient presents with   Dizziness    Ricky Santos is a 88 y.o. male.   Dizziness  Patient has a history of essential thrombocytosis, dizziness, anemia who presents with a complaint of dizziness.  Patient states he feels like the room is spinning.  He feels unsteady in his feet.  Feels like his legs are numb.  He feels like his vision is off.  He has noticed some ringing in his ears.  No trouble with his vision.  The symptoms started 2 days ago and have persisted.  He does feel like the room is spinning.  Lying still makes it better.  Has had some nausea and vomiting.  Does have a mild headache.  Home Medications Prior to Admission medications   Medication Sig Start Date End Date Taking? Authorizing Provider  meclizine (ANTIVERT) 25 MG tablet Take 1 tablet (25 mg total) by mouth 3 (three) times daily as needed for dizziness. 06/04/21  Yes Dorie Rank, MD  aspirin 81 MG tablet Take 81 mg by mouth daily.      [provider]  atorvastatin (LIPITOR) 40 MG tablet Take 40 mg by mouth daily.      [provider]  diltiazem (CARDIZEM CD) 240 MG 24 hr capsule Take 240 mg by mouth daily.      [provider]  finasteride (PROSCAR) 5 MG tablet Take 5 mg by mouth daily.      [provider]  hydrochlorothiazide 25 MG tablet Take 25 mg by mouth daily.      [provider]  hydroxyurea (HYDREA) 500 MG capsule Take 1 tabs on Monday, Wednesday and Friday and 2 tabs daily for the rest of week. May take with food to minimize GI side effects. 05/08/21   Truitt Merle, MD  levothyroxine (SYNTHROID, LEVOTHROID) 25 MCG tablet Take 25 mcg by mouth daily.      [provider]  lisinopril (PRINIVIL,ZESTRIL) 20 MG tablet Take 20 mg by mouth daily. 08/21/14   [provider]  mirtazapine (REMERON) 15 MG tablet  Take 15 mg by mouth at bedtime.  07/27/12   [provider]  pantoprazole (PROTONIX) 40 MG tablet Take 40 mg by mouth daily.      [provider]  psyllium (METAMUCIL) 58.6 % powder Take 1 packet by mouth 3 (three) times daily. Reported on 11/11/2015    [provider]      Allergies    Patient has no known allergies.    Review of Systems   Review of Systems  Neurological:  Positive for dizziness.  All other systems reviewed and are negative.  Physical Exam Updated Vital Signs BP (!) 147/74    Pulse 72    Temp 97.6 F (36.4 C) (Oral)    Resp 16    Ht 1.753 m (5\' 9" )    Wt 73 kg    SpO2 98%    BMI 23.77 kg/m  Physical Exam Vitals and nursing note reviewed.  Constitutional:      General: He is not in acute distress.    Appearance: He is well-developed.  HENT:     Head: Normocephalic and atraumatic.     Right Ear: External ear normal.     Left Ear: External ear normal.  Eyes:     General: No scleral icterus.  Right eye: No discharge.        Left eye: No discharge.     Conjunctiva/sclera: Conjunctivae normal.  Neck:     Trachea: No tracheal deviation.  Cardiovascular:     Rate and Rhythm: Normal rate and regular rhythm.  Pulmonary:     Effort: Pulmonary effort is normal. No respiratory distress.     Breath sounds: Normal breath sounds. No stridor. No wheezing or rales.  Abdominal:     General: Bowel sounds are normal. There is no distension.     Palpations: Abdomen is soft.     Tenderness: There is no abdominal tenderness. There is no guarding or rebound.  Musculoskeletal:        General: No tenderness.     Cervical back: Neck supple.  Skin:    General: Skin is warm and dry.     Findings: No rash.  Neurological:     Mental Status: He is alert and oriented to person, place, and time.     Cranial Nerves: No cranial nerve deficit.     Sensory: No sensory deficit.     Motor: No abnormal muscle tone or seizure activity.     Coordination:  Coordination normal.     Comments: No pronator drift bilateral upper extrem, able to hold both legs off bed for 5 seconds but more difficulty with the right side, sensation intact in all extremities, no visual field cuts, no left or right sided neglect, abnormal finger-nose exam greater with right hand, horizontal nystagmus noted  No facial droop, extraocular movements intact, tongue midline    ED Results / Procedures / Treatments   Labs (all labs ordered are listed, but only abnormal results are displayed) Labs Reviewed  CBC WITH DIFFERENTIAL/PLATELET - Abnormal; Notable for the following components:      Result Value   RBC 4.01 (*)    Hemoglobin 12.7 (*)    HCT 38.4 (*)    RDW 19.5 (*)    Platelets 456 (*)    All other components within normal limits  COMPREHENSIVE METABOLIC PANEL - Abnormal; Notable for the following components:   Glucose, Bld 123 (*)    GFR, Estimated 59 (*)    All other components within normal limits    EKG EKG Interpretation  Date/Time:  Wednesday June 04 2021 17:49:21 EST Ventricular Rate:  82 PR Interval:  168 QRS Duration: 86 QT Interval:  368 QTC Calculation: 430 R Axis:   47 Text Interpretation: Sinus rhythm Abnormal R-wave progression, early transition no change since 3 minutes earlier Confirmed by Dorie Rank [phone]) on 06/04/2021 5:50:51 PM  Radiology CT Head Wo Contrast  Result Date: 06/04/2021 CLINICAL DATA:  Dizziness and headache. EXAM: CT HEAD WITHOUT CONTRAST TECHNIQUE: Contiguous axial images were obtained from the base of the skull through the vertex without intravenous contrast. COMPARISON:  None. FINDINGS: Brain: Age related cerebral atrophy, ventriculomegaly and periventricular white matter disease. No extra-axial fluid collections are identified. No CT findings for acute hemispheric infarction or intracranial hemorrhage. No mass lesions. The brainstem and cerebellum are normal. Vascular: Vascular calcifications but no aneurysm or  hyperdense vessels. Skull: No skull fracture or bone lesions. Sinuses/Orbits: The paranasal sinuses and mastoid air cells are clear. The globes are intact. Other: No scalp lesions or scalp hematoma. IMPRESSION: 1. Age related cerebral atrophy, ventriculomegaly and periventricular white matter disease. 2. No acute intracranial findings or mass lesions. Electronically Signed   By: Marijo Sanes M.D.   On: 06/04/2021 09:40   MR  BRAIN WO CONTRAST  Result Date: 06/04/2021 CLINICAL DATA:  Initial evaluation for neuro deficit, stroke suspected. EXAM: MRI HEAD WITHOUT CONTRAST TECHNIQUE: Multiplanar, multiecho pulse sequences of the brain and surrounding structures were obtained without intravenous contrast. COMPARISON:  Prior CT from earlier the same day. FINDINGS: Brain: Cerebral volume within normal limits for age. Scattered patchy T2/FLAIR hyperintensity involving the periventricular and deep white matter both cerebral hemispheres, most consistent with chronic small vessel ischemic disease, mild in nature. Small remote lacunar infarcts present at the left basal ganglia and left thalamus. No evidence for acute or subacute ischemia. Gray-white matter differentiation maintained. No encephalomalacia to suggest chronic cortical infarction. No acute or chronic intracranial hemorrhage. No mass lesion, midline shift or mass effect. No hydrocephalus or extra axial fluid collection. Pituitary gland suprasellar region normal. Midline structures intact. Vascular: Major intracranial vascular flow voids are maintained. Skull and upper cervical spine: Craniocervical junction within normal limits. Bone marrow signal intensity normal. No scalp soft tissue abnormality. Sinuses/Orbits: Patient status post ocular lens replacement on the right. Globes and orbital soft tissues demonstrate no acute finding. Paranasal sinuses are largely clear. No mastoid effusion. Inner ear structures grossly normal. Other: None. IMPRESSION: 1. No acute  intracranial abnormality. 2. Mild chronic microvascular ischemic disease for age with small remote lacunar infarcts involving the left basal ganglia and left thalamus. Electronically Signed   By: Jeannine Boga M.D.   On: 06/04/2021 19:11    Procedures Procedures    Medications Ordered in ED Medications  meclizine (ANTIVERT) tablet 25 mg (25 mg Oral Given 06/04/21 1824)    ED Course/ Medical Decision Making/ A&P Clinical Course as of 06/04/21 2124  Wed Jun 04, 2021  1748 CT Head Wo Contrast Head CT without acute findings [JK]  1748 Comprehensive metabolic panel(!) Mild hyperglycemia noted but doubt clinically significant [JK]  1748 CBC with Differential(!) Essentially normal [JK]  1748 Patient with persistent dizziness we will proceed with MRI to rule out occult stroke.  Possible symptoms may be related to vertigo [JK]  1959 CBC with Differential(!) No significant anemia stable [JK]  1959 Comprehensive metabolic panel(!) Electrolyte panel unremarkable.  No anemia [JK]  1959 MR BRAIN WO CONTRAST MRI images and radiologist interpretation reviewed.  No signs of acute stroke. [JK]  2124 D/w Dr Marlowe Sax regarding admission [JK]    Clinical Course User Index [JK] Dorie Rank, MD                           Medical Decision Making  Patient presented with acute dizziness.  Evaluated for possible of anemia acute kidney injury dehydration.  No signs of on his laboratory testing.  Patient did have some evidence of possible weakness in his lower extremity as well as nystagmus.  Concerned about the possibility of stroke versus peripheral vertigo.  With his persistent symptoms and abnormal neurologic finding MRI was performed.  Fortunately no signs of acute stroke on his MRI.  Considering his negative MRI I do not feel that hospitalization is necessary at this time.  Patient is feeling better after treatment with meclizine.  Will discharge home with of course of meclizine and outpatient  follow-up. Hypertension noted, doubt acutely related to his symptoms.  8:54 PM attempted to discharge the patient and manage his symptoms at home however the patient feels extremely dizzy as soon as he just tried to sit up in bed.  He is unable to stand and walk.  Patient not safe for  discharge and is at risk for falling and injuring himself.  We will consult the medical service for admission.     Final Clinical Impression(s) / ED Diagnoses Final diagnoses:  Vertigo    Rx / DC Orders ED Discharge Orders          Ordered    meclizine (ANTIVERT) 25 MG tablet  3 times daily PRN        06/04/21 2009               Dorie Rank, MD 06/04/21 2125

## 2021-06-04 NOTE — ED Triage Notes (Signed)
Complains of dizziness 'all the time' except when laying down, reports bilateral leg weakness x2 days. Denies falls. Denies cough, fevers, N/V/D. Feels like the room is spinning and his head is spinning.

## 2021-06-04 NOTE — ED Triage Notes (Cosign Needed Addendum)
MSE was initiated and I personally evaluated the patient and placed orders (if any) at  9:21 AM on June 04, 2021.  The patient appears stable so that the remainder of the MSE may be completed by another provider.  Patient is an 88 year old male who presents today with complaints of dizziness.  He states that same began upon awakening 2 days ago.  He states he is now having difficulty walking due to dizziness.  He describes it as feeling as though the room is spinning.  He states that laying down is the only thing that relieves his symptoms.  He states he has never felt this way before.  He does endorse nausea and vomiting.  He also states that he has a headache that is located at the front of his head, he has not tried anything for his symptoms.  He does not normally get headaches.  He is alert and oriented and neurologically intact with stable vital signs.   Labs, head CT, EKG initiated.   Nestor Lewandowsky 06/04/21 0037    Bud Face, PA-C 06/04/21 [phone]

## 2021-06-04 NOTE — H&P (Signed)
History and Physical    Ricky Santos TTS:[id number] DOB: 10/04/1933 DOA: 06/04/2021  PCP: Wenda Low, MD Patient coming from: Home  Chief Complaint: Dizziness  HPI: Ricky Santos is a 88 y.o. male with medical history significant of essential thrombocytosis, iron deficiency anemia, MGUS, GERD, hyperlipidemia, hypertension, hypothyroidism, prostate cancer, chronic back pain presented to the ED for evaluation of acute onset dizziness.  Slightly hypertensive, remainder of vital signs stable.  Labs showing no leukocytosis.  Hemoglobin 12.7, stable.  Platelet count 456, chronically elevated and stable.  No signs of AKI.  CT head and brain MRI negative for acute finding.  MRI showing small remote lacunar infarcts. Patient was given meclizine.  Admission requested as he continued to experience dizziness and was not able to ambulate.  Patient reports 2-day history of persistent dizziness associated with nausea and vomiting.  He feels okay when laying supine but as soon as he sits up in the bed or tries to get up, he starts feeling dizzy again and very unsteady when walking.  Also endorsing ringing in his ears.  Denies hearing loss.  Denies room spinning sensation.  Denies history of prior stroke.  States meclizine given to him in the ED did not help with his dizziness.  No other complaints.  Denies fevers, cough, shortness of breath, chest pain, abdominal pain, diarrhea, dysuria, or urinary frequency/urgency.  Review of Systems:  All systems reviewed and apart from history of presenting illness, are negative.  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    Essential thrombocytosis ([place]) 08/26/2012   GERD (gastroesophageal reflux disease)    Hernia    Hyperlipidemia    Hypertension    Hypothyroid    Prostate cancer [facility])     Past Surgical History:  Procedure Laterality Date   BACK SURGERY      COLONOSCOPY  2003   COLONOSCOPY N/A 01/17/2013   Procedure: COLONOSCOPY;  Surgeon: Garlan Fair, MD;  Location: [facility] ENDOSCOPY;  Service: Endoscopy;  Laterality: N/A;   exp lap for trauma     HERNIA REPAIR     INCISION AND DRAINAGE ABSCESS ANAL  08/01/05   PROSTATE SURGERY     SHOULDER SURGERY     rt     reports that he has been smoking cigarettes. He has a 15.00 pack-year smoking history. He has never used smokeless tobacco. He reports that he does not drink alcohol and does not use drugs.  No Known Allergies  History reviewed. No pertinent family history.  Prior to Admission medications   Medication Sig Start Date End Date Taking? Authorizing Provider  meclizine (ANTIVERT) 25 MG tablet Take 1 tablet (25 mg total) by mouth 3 (three) times daily as needed for dizziness. 06/04/21  Yes Dorie Rank, MD  aspirin 81 MG tablet Take 81 mg by mouth daily.      [provider]  atorvastatin (LIPITOR) 40 MG tablet Take 40 mg by mouth daily.      [provider]  diltiazem (CARDIZEM CD) 240 MG 24 hr capsule Take 240 mg by mouth daily.      [provider]  finasteride (PROSCAR) 5 MG tablet Take 5 mg by mouth daily.      [provider]  hydrochlorothiazide 25 MG tablet Take 25 mg by mouth daily.      [provider]  hydroxyurea (HYDREA) 500 MG capsule Take 1  tabs on Monday, Wednesday and Friday and 2 tabs daily for the rest of week. May take with food to minimize GI side effects. 05/08/21   Truitt Merle, MD  levothyroxine (SYNTHROID, LEVOTHROID) 25 MCG tablet Take 25 mcg by mouth daily.      [provider]  lisinopril (PRINIVIL,ZESTRIL) 20 MG tablet Take 20 mg by mouth daily. 08/21/14   [provider]  mirtazapine (REMERON) 15 MG tablet Take 15 mg by mouth at bedtime.  07/27/12   [provider]  pantoprazole (PROTONIX) 40 MG tablet Take 40 mg by mouth daily.      [provider]  psyllium (METAMUCIL) 58.6 % powder Take  1 packet by mouth 3 (three) times daily. Reported on 11/11/2015    [provider]    Physical Exam: Vitals:   06/04/21 1800 06/04/21 1905 06/04/21 2000 06/04/21 2040  BP: (!) 160/88 (!) 179/93 (!) 147/74 (!) 171/91  Pulse: 72 68 72 97  Resp: 19 17 16 16   Temp:    98 F (36.7 C)  TempSrc:      SpO2: 98% 99% 98% 99%  Weight:      Height:        Physical Exam Constitutional:      General: He is not in acute distress. HENT:     Head: Normocephalic and atraumatic.  Eyes:     Extraocular Movements: Extraocular movements intact.     Conjunctiva/sclera: Conjunctivae normal.     Comments: Mild bidirectional horizontal nystagmus  Cardiovascular:     Rate and Rhythm: Normal rate and regular rhythm.     Pulses: Normal pulses.  Pulmonary:     Effort: Pulmonary effort is normal. No respiratory distress.     Breath sounds: Normal breath sounds. No wheezing or rales.  Abdominal:     General: Bowel sounds are normal. There is no distension.     Palpations: Abdomen is soft.     Tenderness: There is no abdominal tenderness.  Musculoskeletal:        General: No swelling or tenderness.     Cervical back: Normal range of motion and neck supple.  Skin:    General: Skin is warm and dry.  Neurological:     General: No focal deficit present.     Mental Status: He is alert and oriented to person, place, and time.     Cranial Nerves: No cranial nerve deficit.     Sensory: No sensory deficit.     Motor: No weakness.     Labs on Admission: I have personally reviewed following labs and imaging studies  CBC: Recent Labs  Lab 06/04/21 0911  WBC 5.7  NEUTROABS 4.0  HGB 12.7*  HCT 38.4*  MCV 95.8  PLT 195*   Basic Metabolic Panel: Recent Labs  Lab 06/04/21 0911  NA 137  K 3.7  CL 107  CO2 23  GLUCOSE 123*  BUN 14  CREATININE 1.21  CALCIUM 9.5   GFR: Estimated Creatinine Clearance: 44.6 mL/min (by C-G formula based on SCr of 1.21 mg/dL). Liver Function Tests: Recent  Labs  Lab 06/04/21 0911  AST 20  ALT 13  ALKPHOS 59  BILITOT 0.8  PROT 7.5  ALBUMIN 4.1   No results for input(s): LIPASE, AMYLASE in the last 168 hours. No results for input(s): AMMONIA in the last 168 hours. Coagulation Profile: No results for input(s): INR, PROTIME in the last 168 hours. Cardiac Enzymes: No results for input(s): CKTOTAL, CKMB, CKMBINDEX, TROPONINI in the  last 168 hours. BNP (last 3 results) No results for input(s): PROBNP in the last 8760 hours. HbA1C: No results for input(s): HGBA1C in the last 72 hours. CBG: No results for input(s): GLUCAP in the last 168 hours. Lipid Profile: No results for input(s): CHOL, HDL, LDLCALC, TRIG, CHOLHDL, LDLDIRECT in the last 72 hours. Thyroid Function Tests: No results for input(s): TSH, T4TOTAL, FREET4, T3FREE, THYROIDAB in the last 72 hours. Anemia Panel: No results for input(s): VITAMINB12, FOLATE, FERRITIN, TIBC, IRON, RETICCTPCT in the last 72 hours. Urine analysis: No results found for: COLORURINE, APPEARANCEUR, [place], [place], GLUCOSEU, HGBUR, BILIRUBINUR, KETONESUR, PROTEINUR, UROBILINOGEN, NITRITE, LEUKOCYTESUR  Radiological Exams on Admission: CT Head Wo Contrast  Result Date: 06/04/2021 CLINICAL DATA:  Dizziness and headache. EXAM: CT HEAD WITHOUT CONTRAST TECHNIQUE: Contiguous axial images were obtained from the base of the skull through the vertex without intravenous contrast. COMPARISON:  None. FINDINGS: Brain: Age related cerebral atrophy, ventriculomegaly and periventricular white matter disease. No extra-axial fluid collections are identified. No CT findings for acute hemispheric infarction or intracranial hemorrhage. No mass lesions. The brainstem and cerebellum are normal. Vascular: Vascular calcifications but no aneurysm or hyperdense vessels. Skull: No skull fracture or bone lesions. Sinuses/Orbits: The paranasal sinuses and mastoid air cells are clear. The globes are intact. Other: No scalp lesions or  scalp hematoma. IMPRESSION: 1. Age related cerebral atrophy, ventriculomegaly and periventricular white matter disease. 2. No acute intracranial findings or mass lesions. Electronically Signed   By: Marijo Sanes M.D.   On: 06/04/2021 09:40   MR BRAIN WO CONTRAST  Result Date: 06/04/2021 CLINICAL DATA:  Initial evaluation for neuro deficit, stroke suspected. EXAM: MRI HEAD WITHOUT CONTRAST TECHNIQUE: Multiplanar, multiecho pulse sequences of the brain and surrounding structures were obtained without intravenous contrast. COMPARISON:  Prior CT from earlier the same day. FINDINGS: Brain: Cerebral volume within normal limits for age. Scattered patchy T2/FLAIR hyperintensity involving the periventricular and deep white matter both cerebral hemispheres, most consistent with chronic small vessel ischemic disease, mild in nature. Small remote lacunar infarcts present at the left basal ganglia and left thalamus. No evidence for acute or subacute ischemia. Gray-white matter differentiation maintained. No encephalomalacia to suggest chronic cortical infarction. No acute or chronic intracranial hemorrhage. No mass lesion, midline shift or mass effect. No hydrocephalus or extra axial fluid collection. Pituitary gland suprasellar region normal. Midline structures intact. Vascular: Major intracranial vascular flow voids are maintained. Skull and upper cervical spine: Craniocervical junction within normal limits. Bone marrow signal intensity normal. No scalp soft tissue abnormality. Sinuses/Orbits: Patient status post ocular lens replacement on the right. Globes and orbital soft tissues demonstrate no acute finding. Paranasal sinuses are largely clear. No mastoid effusion. Inner ear structures grossly normal. Other: None. IMPRESSION: 1. No acute intracranial abnormality. 2. Mild chronic microvascular ischemic disease for age with small remote lacunar infarcts involving the left basal ganglia and left thalamus. Electronically  Signed   By: Jeannine Boga M.D.   On: 06/04/2021 19:11    EKG: Independently reviewed.  Sinus rhythm, no significant change since prior tracing.  Assessment/Plan Principal Problem:   Dizziness Active Problems:   Essential thrombocytosis (HCC)   Iron deficiency anemia   MGUS (monoclonal gammopathy of unknown significance)   HTN (hypertension)   Acute onset persistent dizziness Patient is presenting with 2-day history of persistent dizziness associated with nausea, vomiting, tinnitus, and gait instability.  No hearing loss.  Denies room spinning sensation.  MRI negative for acute stroke.  He was given meclizine in  the ED but continues to be symptomatic.  He has slight bidirectional horizontal nystagmus on exam.  Differentials include BPPV, vestibular neuritis, and ?Mnire's disease. -Continue meclizine 25 mg TID.  Given persistent symptoms, start 10-day prednisone taper.  Antiemetic as needed.  PT evaluation for vestibular rehab.  Fall precautions.  Check orthostatics.  Essential thrombocytosis Platelet count stable. -Resume Hydrea after pharmacy med rec is done.  Outpatient hematology follow-up  Iron deficiency anemia Hemoglobin stable. -Outpatient hematology follow-up  MGUS -Outpatient hematology follow-up.  GERD -Resume PPI after pharmacy med rec is done  Hypertension -Hold antihypertensives at this time and check orthostatics.  Hypothyroidism -Resume Synthroid after pharmacy med rec is done.  DVT prophylaxis: Lovenox Code Status: Patient wishes to be full code. Family Communication: No family available at this time. Disposition Plan: Status is: Observation  The patient remains OBS appropriate and will d/c before 2 midnights.  Level of care:  Level of care: Med-Surg  The medical decision making on this patient was of high complexity and the patient is at high risk for clinical deterioration, therefore this is a level 3 visit.  Shela Leff MD [facility]  If 7PM-7AM, please contact night-coverage [email or web address]  06/04/2021, 10:28 PM

## 2021-06-04 NOTE — ED Notes (Signed)
Tried to ambulate pt. Pt felt dizzy sitting on the side of the bed. Pt attempted to stand up but was unsuccessful

## 2021-06-04 NOTE — ED Notes (Signed)
Patient transported to MRI 

## 2021-06-05 DIAGNOSIS — D473 Essential (hemorrhagic) thrombocythemia: Secondary | ICD-10-CM

## 2021-06-05 DIAGNOSIS — R42 Dizziness and giddiness: Secondary | ICD-10-CM | POA: Diagnosis not present

## 2021-06-05 DIAGNOSIS — H8113 Benign paroxysmal vertigo, bilateral: Secondary | ICD-10-CM | POA: Diagnosis not present

## 2021-06-05 MED ORDER — PANTOPRAZOLE SODIUM 40 MG PO TBEC
40.0000 mg | DELAYED_RELEASE_TABLET | Freq: Every day | ORAL | Status: DC
  Administered 2021-06-05 – 2021-06-06 (×2): 40 mg via ORAL
  Filled 2021-06-05 (×2): qty 1

## 2021-06-05 MED ORDER — HYDROXYUREA 500 MG PO CAPS
500.0000 mg | ORAL_CAPSULE | Freq: Every day | ORAL | Status: DC
  Administered 2021-06-05: 500 mg via ORAL
  Filled 2021-06-05 (×2): qty 1

## 2021-06-05 MED ORDER — TAMSULOSIN HCL 0.4 MG PO CAPS
0.4000 mg | ORAL_CAPSULE | Freq: Every morning | ORAL | Status: DC
  Administered 2021-06-06: 0.4 mg via ORAL
  Filled 2021-06-05: qty 1

## 2021-06-05 MED ORDER — MIRTAZAPINE 15 MG PO TABS
15.0000 mg | ORAL_TABLET | Freq: Every day | ORAL | Status: DC
  Administered 2021-06-05: 15 mg via ORAL
  Filled 2021-06-05: qty 1

## 2021-06-05 MED ORDER — LEVOTHYROXINE SODIUM 25 MCG PO TABS
25.0000 ug | ORAL_TABLET | Freq: Every day | ORAL | Status: DC
  Administered 2021-06-06: 25 ug via ORAL
  Filled 2021-06-05: qty 1

## 2021-06-05 MED ORDER — MOMETASONE FURO-FORMOTEROL FUM 200-5 MCG/ACT IN AERO
2.0000 | INHALATION_SPRAY | Freq: Two times a day (BID) | RESPIRATORY_TRACT | Status: DC
  Administered 2021-06-06: 2 via RESPIRATORY_TRACT
  Filled 2021-06-05: qty 8.8

## 2021-06-05 NOTE — Progress Notes (Signed)
°  Progress Note   Patient: Ricky Santos AXK:[id number] DOB: 06-25-1933 DOA: 06/04/2021     0 DOS: the patient was seen and examined on 06/05/2021   Brief hospital course: 88 year old man presented with dizziness, gait instability nausea.  MRI brain negative for stroke.  Treated with meclizine without significant improvement.  Continues to have nausea.  Seen by PT, positive for BPPV.  Continue therapy.  Assessment and Plan1 BPPV, persistent dizziness/vertigo. --Continues to have nausea although able to eat.  Worked with therapy today, outpatient vestibular rehab recommended.  Dix-Hallpike was positive both left and right side.   Essential thrombocytosis --Stable.  Continue Hydrea.   GERD --PPI  Subjective:  Has nausea but was able to eat, nausea all day.  Still has dizziness. Reports some stiffness in right leg.  This happens occasionally  Objective Vital signs were reviewed and unremarkable. Physical Exam Constitutional:      General: He is not in acute distress.    Appearance: He is not ill-appearing or toxic-appearing.  Cardiovascular:     Rate and Rhythm: Normal rate and regular rhythm.     Heart sounds: No murmur heard. Pulmonary:     Effort: Pulmonary effort is normal. No respiratory distress.     Breath sounds: No wheezing, rhonchi or rales.  Neurological:     Mental Status: He is alert.  Psychiatric:        Mood and Affect: Mood normal.        Behavior: Behavior normal.     Data Reviewed:  CMP and CBC unremarkable.  Brain MRI negative.  Family Communication: none  Disposition: Status is: Observation  The patient remains OBS appropriate and will d/c before 2 midnights.        Time spent: 25 minutes  Author: Murray Hodgkins, MD 06/05/2021 7:43 PM  For on call review [email or web address].

## 2021-06-05 NOTE — Evaluation (Signed)
Physical Therapy Evaluation Patient Details Name: Ricky Santos MRN: [id number] DOB: [birth year] Today's Date: 06/05/2021  History of Present Illness  Patient is a 88 y.o. male presented to the ED for evaluation of acute onset dizziness. CT head and brain MRI negative for acute finding. MRI showing small remote lacunar infarcts. PMH significant of essential thrombocytosis, iron deficiency anemia, MGUS, GERD, HTN, HLD, hypothyroidism, prostate cancer, chronic back pain.   Clinical Impression  Ricky Santos is 88 y.o. male admitted with above HPI and diagnosis. Patient is currently limited by functional impairments below (see PT problem list). Patient lives with his grandson and is independent at baseline. Vestibular evaluation completed this session. HINTS testing completed and negative for sign of central source of vertigo. Grade 1 Rt beating nystagmus noted in Lt gaze and impaired head impulse with Lt rotation. Dix hallpike was positive with Rt upward rotational nystagmus on both Lt and Rt side. Patient will benefit from continued skilled PT interventions to address impairments and progress independence with mobility, recommending OPPT vestibular rehab when pt is safe for discharge home. Acute PT will follow and progress as able.        Recommendations for follow up therapy are one component of a multi-disciplinary discharge planning process, led by the attending physician.  Recommendations may be updated based on patient status, additional functional criteria and insurance authorization.  PT Recommendation   Follow Up Recommendations Outpatient PT  [vstibular] Filed 06/05/2021 0900  Assistance recommended at discharge Intermittent Supervision/Assistance Filed 06/05/2021 0900  Functional Status Assessment Patient has had a recent decline in their functional status and demonstrates the ability to make significant improvements in function in a reasonable and predictable amount of time. Filed 06/05/2021  0900  PT equipment --  [TBA] Filed 06/05/2021 0900   Precautions / Restrictions  Fall     Vestibular Assessment - 06/05/21 0001       Vestibular Assessment   General Observation Pleasant, no distress.      Symptom Behavior   Subjective history of current problem Patient is an 88 year old male who presents today with complaints of dizziness.  He states that same began upon awakening 2 days ago.  He states he is now having difficulty walking due to dizziness.  He describes it as feeling as though the room is spinning.  He states that laying down is the only thing that relieves his symptoms.  He states he has never felt this way before.  He does endorse nausea and vomiting.  He also states that he has a headache that is located at the front of his head, he has not tried anything for his symptoms.  He does not normally get headaches.   since Tuesday morning. almost fell, caught himself when walking through door and caught balance   Type of Dizziness  Blurred vision;Imbalance;"Funny feeling in head";Lightheadedness;Spinning   felt visual loss at times (Rt side)   Frequency of Dizziness Constant, it subsides but never goes away, worse when get up to sit or walk.    Duration of Dizziness doesn't calm back down until sit or lay down    Symptom Nature Positional;Spontaneous;Motion provoked    Aggravating Factors Spontaneous onset;Supine to sit;Sit to stand    Relieving Factors Slow movements;Closing eyes;Lying supine;Head stationary    History of similar episodes 2016 per chart, pt reports its been a while      Oculomotor Exam   Oculomotor Alignment Normal    Ocular ROM WNLs    Spontaneous Absent  Gaze-induced  Right beating nystagmus with L gaze   Lt gaze nysgmus worse with Lt an dupward gaze   Head shaking Horizontal Comment   poor tracking especially when truning Lt   Head Shaking Vertical Absent   up and down felt bad, some trouble keeping eyes open, no nystagmus   Smooth Pursuits Intact     Saccades Intact    Comment Test of Skew: normal findings      Oculomotor Exam-Fixation Suppressed    Left Head Impulse Nystagmus beating Rt with Lt turn    Right Head Impulse intact      Positional Testing   Dix-Hallpike Dix-Hallpike Right;Dix-Hallpike Left      Dix-Hallpike Right   Dix-Hallpike Right Duration pt not symptomatic on first test. second retest symptoms/nystagmus present and, resolved ~30 seconds, treated after.    Dix-Hallpike Right Symptoms Upbeat, right rotatory nystagmus      Dix-Hallpike Left   Dix-Hallpike Left Duration symptoms on 1st test with delayed onset of ~15-20 seconds, symptoms resoleved ~30 seconds and treatment initiated. retested and no symptoms or nystagmus.    Dix-Hallpike Left Symptoms Upbeat, right rotatory nystagmus      Cognition   Cognition Orientation Level Oriented x 4               06/05/21 0900  PT Visit Information  Last PT Received On 06/05/21  Assistance Needed +1  History of Present Illness Patient is a 88 y.o. male presented to the ED for evaluation of acute onset dizziness. CT head and brain MRI negative for acute finding.  MRI showing small remote lacunar infarcts. PMH significant of essential thrombocytosis, iron deficiency anemia, MGUS, GERD, HTN, HLD, hypothyroidism, prostate cancer, chronic back pain.  Precautions  Precautions Fall  Restrictions  Weight Bearing Restrictions No  Home Living  Family/patient expects to be discharged to: Private residence  Living Arrangements Other relatives (grandson, 37 y.o.)  Available Help at Discharge Family  Type of [place] to enter  Entrance Stairs-Number of Steps 1  Entrance Stairs-Rails None  Home Layout One level  Additional Comments pt enjoys golf, used to be Museum/gallery curator  Prior Function  Prior Level of Function  Independent/Modified Independent  Communication  Communication No difficulties  Pain Assessment  Pain Assessment  No/denies pain  Cognition  Arousal/Alertness Awake/alert  Behavior During Therapy WFL for tasks assessed/performed  Overall Cognitive Status Within Functional Limits for tasks assessed  Upper Extremity Assessment  Upper Extremity Assessment Overall WFL for tasks assessed  Lower Extremity Assessment  Lower Extremity Assessment Overall WFL for tasks assessed  Cervical / Trunk Assessment  Cervical / Trunk Assessment Normal  Bed Mobility  Overal bed mobility Modified Independent  General bed mobility comments able to move in bed for vestibular testing, roll, move to long sitting, supine<>sit. pt taking extra time.  Transfers  Overall transfer level Needs assistance  Equipment used 1 person hand held assist  Transfers Sit to/from Stand;Bed to chair/wheelchair/BSC  Sit to Stand Min assist  Bed to/from chair/wheelchair/BSC transfer type: Step pivot  Step pivot transfers Min assist  General transfer comment HHA to steady with rise from EOB and small steps to move to recliner. No assist needed for power up or controlled lowering.  Balance  Overall balance assessment Needs assistance  Sitting-balance support Feet supported  Sitting balance-Leahy Scale Good  Standing balance support Single extremity supported  Standing balance-Leahy Scale Fair  PT - End of Session  Equipment Utilized During  Treatment Gait belt  Activity Tolerance Patient tolerated treatment well  Patient left in chair;with call bell/phone within reach;with chair alarm set  Nurse Communication Mobility status  PT Assessment  PT Recommendation/Assessment Patient needs continued PT services  PT Visit Diagnosis Other abnormalities of gait and mobility (R26.89);Unsteadiness on feet (R26.81);BPPV;Dizziness and giddiness (R42)  BPPV - Right/Left  Right;Left  PT Problem List Decreased balance;Decreased activity tolerance;Decreased mobility  PT Plan  PT Frequency (ACUTE ONLY) Min 3X/week  PT Treatment/Interventions (ACUTE ONLY)  DME instruction;Gait training;Stair training;Functional mobility training;Therapeutic activities;Therapeutic exercise;Balance training;Neuromuscular re-education;Patient/family education  AM-PAC PT "6 Clicks" Mobility Outcome Measure (Version 2)  Help needed turning from your back to your side while in a flat bed without using bedrails? 4  Help needed moving from lying on your back to sitting on the side of a flat bed without using bedrails? 3  Help needed moving to and from a bed to a chair (including a wheelchair)? 3  Help needed standing up from a chair using your arms (e.g., wheelchair or bedside chair)? 3  Help needed to walk in hospital room? 3  Help needed climbing 3-5 steps with a railing?  2  6 Click Score 18  Consider Recommendation of Discharge To: Home with [facility]  Progressive Mobility  What is the highest level of mobility based on the progressive mobility assessment? Level 3 (Stands with assist) - Balance while standing  and cannot march in place  Mobility Out of bed for toileting;Out of bed to chair with meals  PT Recommendation  Follow Up Recommendations Outpatient PT (vstibular)  Assistance recommended at discharge Intermittent Supervision/Assistance  Functional Status Assessment Patient has had a recent decline in their functional status and demonstrates the ability to make significant improvements in function in a reasonable and predictable amount of time.  PT equipment  (TBA)  Individuals Consulted  Consulted and Agree with Results and Recommendations Patient  Acute Rehab PT Goals  Patient Stated Goal get back to golf and stop being dizzy  PT Goal Formulation With patient  Time For Goal Achievement 06/12/21  Potential to Achieve Goals Good  PT Time Calculation  PT Start Time (ACUTE ONLY) 0928  PT Stop Time (ACUTE ONLY) 1014  PT Time Calculation (min) (ACUTE ONLY) 46 min  PT General Charges  $$ ACUTE PT VISIT 1 Visit  PT Evaluation  $PT Eval Moderate Complexity 1 Mod   PT Treatments  $Canalith Rep Proc 8-22 mins  $Physical Performance Test 8-22 mins  Written Expression  Dominant Hand Right    Verner Mould, DPT Acute Rehabilitation Services Office [phone] Pager [phone]    Jacques Navy 06/05/2021, 2:21 PM

## 2021-06-05 NOTE — Progress Notes (Signed)
Physical Therapy Treatment Patient Details Name: Ricky Santos MRN: [id number] DOB: 09/04/1933 Today's Date: 06/05/2021   History of Present Illness Patient is a 88 y.o. male presented to the ED for evaluation of acute onset dizziness. CT head and brain MRI negative for acute finding.  MRI showing small remote lacunar infarcts. PMH significant of essential thrombocytosis, iron deficiency anemia, MGUS, GERD, HTN, HLD, hypothyroidism, prostate cancer, chronic back pain.    PT Comments    Patient seen for additional vestibular treatment. Treatment completed for Rt posterior BPPV and symptoms improved after each treatment but not fully resolved. Gait initiated after treatment and pt experienced posterior LOB requiring min-mod assist to steady. Pt ambulated short distance of ~50' with HHA to steady. EOS educated pt on BPPV treatments and duration as well as multiple canals it can effect. Next visit will treat Lt side if necessary and check for horizontal BPPV.    Recommendations for follow up therapy are one component of a multi-disciplinary discharge planning process, led by the attending physician.  Recommendations may be updated based on patient status, additional functional criteria and insurance authorization.  Follow Up Recommendations  Outpatient PT (vstibular)     Assistance Recommended at Discharge Intermittent Supervision/Assistance  Patient can return home with the following     Equipment Recommendations   (TBA)    Recommendations for Other Services       Precautions / Restrictions Precautions Precautions: Fall Restrictions Weight Bearing Restrictions: No      Vestibular Assessment - 06/05/21 1629       Vestibular Assessment   General Observation Pleasant, no distress; pt still up in chair.      Symptom Behavior   Type of Dizziness  Blurred vision;Imbalance;"Funny feeling in head";Lightheadedness;Spinning   felt visual loss at times (Rt side)   Frequency of Dizziness  Constant, it subsides but never goes away, worse when get up to sit or walk.    Duration of Dizziness doesn't calm back down until sit or lay down    Symptom Nature Positional;Spontaneous;Motion provoked    Aggravating Factors Spontaneous onset;Supine to sit;Sit to stand    Relieving Factors Slow movements;Closing eyes;Lying supine;Head stationary    History of similar episodes 2016 per chart, pt reports its been a while      Oculomotor Exam   Oculomotor Alignment Normal    Ocular ROM WNLs    Spontaneous Absent      Positional Testing   Dix-Hallpike Dix-Hallpike Right;Dix-Hallpike Left      Dix-Hallpike Right   Dix-Hallpike Right Duration Symptom onset after ~10-15 seconds and resolved after ~30-45 seconds. performed and treated 2x    Dix-Hallpike Right Symptoms Upbeat, right rotatory nystagmus      Dix-Hallpike Left   Dix-Hallpike Left Duration treated Rt side only this session      Cognition   Cognition Orientation Level Oriented x 4              Mobility  Bed Mobility Overal bed mobility: Modified Independent             General bed mobility comments: able to move in bed for vestibular testing, roll, move to long sitting, supine<>sit. pt taking extra time.    Transfers Overall transfer level: Needs assistance Equipment used: 1 person hand held assist Transfers: Sit to/from Stand;Bed to chair/wheelchair/BSC Sit to Stand: Min assist     Step pivot transfers: Min assist     General transfer comment: After vestibular treatment on first stand pt experienced retropulsion  and min-mod assist required to stabilize balance and prevent posterio LOB. falling sensation subsided after a couple seconds and pt steady in standing.    Ambulation/Gait Ambulation/Gait assistance: Min assist Gait Distance (Feet): 40 Feet Assistive device: 1 person hand held assist Gait Pattern/deviations: Step-through pattern;Decreased stride length;Shuffle Gait velocity: decr      General Gait Details: pt with very cautions and shuffled steps, HHA to steady. no increase in symptoms with gait or turns. pt keeping head still while ambulating.   Stairs             Wheelchair Mobility    Modified Rankin (Stroke Patients Only)       Balance Overall balance assessment: Needs assistance Sitting-balance support: Feet supported Sitting balance-Leahy Scale: Good     Standing balance support: Single extremity supported Standing balance-Leahy Scale: Fair                              Cognition Arousal/Alertness: Awake/alert Behavior During Therapy: WFL for tasks assessed/performed Overall Cognitive Status: Within Functional Limits for tasks assessed                                          Exercises      General Comments        Pertinent Vitals/Pain Pain Assessment: No/denies pain    Home Living                          Prior Function            PT Goals (current goals can now be found in the care plan section) Acute Rehab PT Goals Patient Stated Goal: get back to golf and stop being dizzy PT Goal Formulation: With patient Time For Goal Achievement: 06/12/21 Potential to Achieve Goals: Good Progress towards PT goals: Progressing toward goals    Frequency    Min 3X/week      PT Plan Current plan remains appropriate    Co-evaluation              AM-PAC PT "6 Clicks" Mobility   Outcome Measure  Help needed turning from your back to your side while in a flat bed without using bedrails?: None Help needed moving from lying on your back to sitting on the side of a flat bed without using bedrails?: A Little Help needed moving to and from a bed to a chair (including a wheelchair)?: A Little Help needed standing up from a chair using your arms (e.g., wheelchair or bedside chair)?: A Little Help needed to walk in hospital room?: A Little Help needed climbing 3-5 steps with a railing? : A  Lot 6 Click Score: 18    End of Session Equipment Utilized During Treatment: Gait belt Activity Tolerance: Patient tolerated treatment well Patient left: in chair;with call bell/phone within reach;with chair alarm set Nurse Communication: Mobility status PT Visit Diagnosis: Other abnormalities of gait and mobility (R26.89);Unsteadiness on feet (R26.81);BPPV;Dizziness and giddiness (R42) BPPV - Right/Left : Right;Left     Time: 4742-5956 PT Time Calculation (min) (ACUTE ONLY): 31 min  Charges:  $Gait Training: 8-22 mins $Canalith Rep Proc: 8-22 mins                     Verner Mould, DPT Acute Rehabilitation Services Office [phone]  Pager [phone]    Jacques Navy 06/05/2021, 4:42 PM

## 2021-06-05 NOTE — Hospital Course (Signed)
° °  2-day history of persistent dizziness associated with nausea, vomiting, tinnitus, and gait instability.  MRI negative for acute stroke.  Still symptomatic after meclizine.  Started on steroid taper for presumed vestibular neuritis.  PT vestibular rehab.  HPI: Ricky Santos is a 88 y.o. male with medical history significant of essential thrombocytosis, iron deficiency anemia, MGUS, GERD, hyperlipidemia, hypertension, hypothyroidism, prostate cancer, chronic back pain presented to the ED for evaluation of acute onset dizziness.  Slightly hypertensive, remainder of vital signs stable.  Labs showing no leukocytosis.  Hemoglobin 12.7, stable.  Platelet count 456, chronically elevated and stable.  No signs of AKI.  CT head and brain MRI negative for acute finding.  MRI showing small remote lacunar infarcts. Patient was given meclizine.  Admission requested as he continued to experience dizziness and was not able to ambulate.   Patient reports 2-day history of persistent dizziness associated with nausea and vomiting.  He feels okay when laying supine but as soon as he sits up in the bed or tries to get up, he starts feeling dizzy again and very unsteady when walking.  Also endorsing ringing in his ears.  Denies hearing loss.  Denies room spinning sensation.  Denies history of prior stroke.  States meclizine given to him in the ED did not help with his dizziness.  No other complaints.  Denies fevers, cough, shortness of breath, chest pain, abdominal pain, diarrhea, dysuria, or urinary frequency/urgency.  Acute onset persistent dizziness Patient is presenting with 2-day history of persistent dizziness associated with nausea, vomiting, tinnitus, and gait instability.  No hearing loss.  Denies room spinning sensation.  MRI negative for acute stroke.  He was given meclizine in the ED but continues to be symptomatic.  He has slight bidirectional horizontal nystagmus on exam.  Differentials include BPPV, vestibular  neuritis, and ?Mnire's disease. -Continue meclizine 25 mg TID.  Given persistent symptoms, start 10-day prednisone taper.  Antiemetic as needed.  PT evaluation for vestibular rehab.  Fall precautions.  Check orthostatics.   Essential thrombocytosis Platelet count stable. -Resume Hydrea after pharmacy med rec is done.  Outpatient hematology follow-up   Iron deficiency anemia Hemoglobin stable. -Outpatient hematology follow-up   MGUS -Outpatient hematology follow-up.   GERD -Resume PPI after pharmacy med rec is done   Hypertension -Hold antihypertensives at this time and check orthostatics.   Hypothyroidism -Resume Synthroid after pharmacy med rec is done.

## 2021-06-06 DIAGNOSIS — H8120 Vestibular neuronitis, unspecified ear: Secondary | ICD-10-CM

## 2021-06-06 DIAGNOSIS — H8110 Benign paroxysmal vertigo, unspecified ear: Secondary | ICD-10-CM

## 2021-06-06 MED ORDER — HYDROXYUREA 500 MG PO CAPS: 1000.0000 mg | ORAL_CAPSULE | ORAL | Status: DC

## 2021-06-06 MED ORDER — HYDROXYUREA 500 MG PO CAPS
500.0000 mg | ORAL_CAPSULE | ORAL | Status: DC
  Administered 2021-06-06: 500 mg via ORAL
  Filled 2021-06-06: qty 1

## 2021-06-06 NOTE — Discharge Summary (Signed)
Physician Discharge Summary   Patient: Ricky Santos MRN: [id number] DOB: [birth year]  Admit date:     06/04/2021  Discharge date: 06/06/21  Discharge Physician: Murray Hodgkins   PCP: Wenda Low, MD   Recommendations at discharge:    Follow-up BPPV, vestibular neuritis, dizziness  Discharge Diagnoses Principal Problem:   BPPV (benign paroxysmal positional vertigo) Active Problems:   Essential thrombocytosis (HCC)   Dizziness   Iron deficiency anemia   MGUS (monoclonal gammopathy of unknown significance)   HTN (hypertension)   Vestibular neuritis   Hospital Course   88 year old man presented with dizziness, gait instability nausea.  MRI brain negative for stroke.  Treated with meclizine without significant improvement.  Continues to have nausea.  Seen by PT, positive for BPPV.  Treated for this and vestibular neuritis.  Vertigo has resolved, still balance a little off when he gets up and walks.  However felt to be safe for discharge home, appears to have achieved maximal inpatient benefit.  Seen by therapy with recommendation for outpatient vestibular rehab, did better with rolling walker in house.  Fortunately grandson will be staying with him 24/7 and can provide assistance.  Vertigo, BPPV, possible vestibular neuritis --Vertigo resolved, treated for BPPV with physical therapy, plan for outpatient vestibular rehab, treated with prednisone.  MRI brain was negative for stroke.  Meclizine of questionable benefit.     Essential thrombocytosis --Platelet count stable. --Continue Hydrea    MGUS -Outpatient hematology follow-up.        Consultants: none Procedures performed: none  Disposition: Home Diet recommendation: Regular diet  DISCHARGE MEDICATION: Allergies as of 06/06/2021   No Known Allergies      Medication List     TAKE these medications    Advair Diskus 250-50 MCG/ACT Aepb Generic drug: fluticasone-salmeterol Inhale 1 puff into the lungs 2 (two)  times daily.   aspirin 81 MG tablet Take 81 mg by mouth daily.   atorvastatin 40 MG tablet Commonly known as: LIPITOR Take 40 mg by mouth daily.   hydroxyurea 500 MG capsule Commonly known as: HYDREA Take 1 tabs on Monday, Wednesday and Friday and 2 tabs daily for the rest of week. May take with food to minimize GI side effects.   levothyroxine 25 MCG tablet Commonly known as: SYNTHROID Take 25 mcg by mouth daily.   losartan-hydrochlorothiazide 50-12.5 MG tablet Commonly known as: HYZAAR Take 1 tablet by mouth daily.   meclizine 25 MG tablet Commonly known as: ANTIVERT Take 1 tablet (25 mg total) by mouth 3 (three) times daily as needed for dizziness. What changed:  when to take this reasons to take this additional instructions   MiraLax 17 GM/SCOOP powder Generic drug: polyethylene glycol powder Take 17 g by mouth daily.   mirtazapine 15 MG tablet Commonly known as: REMERON Take 15 mg by mouth at bedtime.   pantoprazole 40 MG tablet Commonly known as: PROTONIX Take 40 mg by mouth daily.   tamsulosin 0.4 MG Caps capsule Commonly known as: FLOMAX Take 0.4 mg by mouth every morning.   Tiadylt ER 240 MG 24 hr capsule Generic drug: diltiazem Take 240 mg by mouth daily.        Follow-up Information     [facility].   Specialty: Rehabilitation Why: They will call you to schedule an appointment. Make sure to schedule a time when someone can assist with transportation. Contact information: [address] [place] [phone] [place] [phone]  Wenda Low, MD. Schedule an appointment as soon as possible for a visit in 1 week(s).   Specialty: Internal Medicine Contact information: [address], [address] [place] [address] [phone]                Vertigo has resolved, that is better, still unsteady on his feet and feels dizzy.  Reports he  has his grandson with him 24/7.  Discharge Exam: Filed Weights   06/04/21 0835  Weight: 73 kg   Physical Exam Vitals reviewed.  Constitutional:      General: He is not in acute distress.    Appearance: He is not ill-appearing or toxic-appearing.  Cardiovascular:     Rate and Rhythm: Normal rate and regular rhythm.     Heart sounds: No murmur heard. Pulmonary:     Effort: Pulmonary effort is normal. No respiratory distress.     Breath sounds: No wheezing, rhonchi or rales.  Neurological:     Mental Status: He is alert.  Psychiatric:        Mood and Affect: Mood normal.        Behavior: Behavior normal.     Condition at discharge: good  The results of significant diagnostics from this hospitalization (including imaging, microbiology, ancillary and laboratory) are listed below for reference.   Imaging Studies: CT Head Wo Contrast  Result Date: 06/04/2021 CLINICAL DATA:  Dizziness and headache. EXAM: CT HEAD WITHOUT CONTRAST TECHNIQUE: Contiguous axial images were obtained from the base of the skull through the vertex without intravenous contrast. COMPARISON:  None. FINDINGS: Brain: Age related cerebral atrophy, ventriculomegaly and periventricular white matter disease. No extra-axial fluid collections are identified. No CT findings for acute hemispheric infarction or intracranial hemorrhage. No mass lesions. The brainstem and cerebellum are normal. Vascular: Vascular calcifications but no aneurysm or hyperdense vessels. Skull: No skull fracture or bone lesions. Sinuses/Orbits: The paranasal sinuses and mastoid air cells are clear. The globes are intact. Other: No scalp lesions or scalp hematoma. IMPRESSION: 1. Age related cerebral atrophy, ventriculomegaly and periventricular white matter disease. 2. No acute intracranial findings or mass lesions. Electronically Signed   By: Marijo Sanes M.D.   On: 06/04/2021 09:40   MR BRAIN WO CONTRAST  Result Date: 06/04/2021 CLINICAL DATA:   Initial evaluation for neuro deficit, stroke suspected. EXAM: MRI HEAD WITHOUT CONTRAST TECHNIQUE: Multiplanar, multiecho pulse sequences of the brain and surrounding structures were obtained without intravenous contrast. COMPARISON:  Prior CT from earlier the same day. FINDINGS: Brain: Cerebral volume within normal limits for age. Scattered patchy T2/FLAIR hyperintensity involving the periventricular and deep white matter both cerebral hemispheres, most consistent with chronic small vessel ischemic disease, mild in nature. Small remote lacunar infarcts present at the left basal ganglia and left thalamus. No evidence for acute or subacute ischemia. Gray-white matter differentiation maintained. No encephalomalacia to suggest chronic cortical infarction. No acute or chronic intracranial hemorrhage. No mass lesion, midline shift or mass effect. No hydrocephalus or extra axial fluid collection. Pituitary gland suprasellar region normal. Midline structures intact. Vascular: Major intracranial vascular flow voids are maintained. Skull and upper cervical spine: Craniocervical junction within normal limits. Bone marrow signal intensity normal. No scalp soft tissue abnormality. Sinuses/Orbits: Patient status post ocular lens replacement on the right. Globes and orbital soft tissues demonstrate no acute finding. Paranasal sinuses are largely clear. No mastoid effusion. Inner ear structures grossly normal. Other: None. IMPRESSION: 1. No acute intracranial abnormality. 2. Mild chronic microvascular ischemic disease for age with small  remote lacunar infarcts involving the left basal ganglia and left thalamus. Electronically Signed   By: Jeannine Boga M.D.   On: 06/04/2021 19:11    Microbiology: Results for orders placed or performed during the hospital encounter of 06/04/21  Resp Panel by RT-PCR (Flu A&B, Covid) Nasopharyngeal Swab     Status: None   Collection Time: 06/04/21  9:23 PM   Specimen: Nasopharyngeal  Swab; Nasopharyngeal(NP) swabs in vial transport medium  Result Value Ref Range Status   SARS Coronavirus 2 by RT PCR NEGATIVE NEGATIVE Final    Comment: (NOTE) SARS-CoV-2 target nucleic acids are NOT DETECTED.  The SARS-CoV-2 RNA is generally detectable in upper respiratory specimens during the acute phase of infection. The lowest concentration of SARS-CoV-2 viral copies this assay can detect is 138 copies/mL. A negative result does not preclude SARS-Cov-2 infection and should not be used as the sole basis for treatment or other patient management decisions. A negative result may occur with  improper specimen collection/handling, submission of specimen other than nasopharyngeal swab, presence of viral mutation(s) within the areas targeted by this assay, and inadequate number of viral copies(<138 copies/mL). A negative result must be combined with clinical observations, patient history, and epidemiological information. The expected result is Negative.  Fact Sheet for Patients:  EntrepreneurPulse.com.au  Fact Sheet for Healthcare Providers:  IncredibleEmployment.be  This test is no t yet approved or cleared by the [place] FDA and  has been authorized for detection and/or diagnosis of SARS-CoV-2 by FDA under an Emergency Use Authorization (EUA). This EUA will remain  in effect (meaning this test can be used) for the duration of the COVID-19 declaration under Section 564(b)(1) of the Act, 21 U.S.C.section 360bbb-3(b)(1), unless the authorization is terminated  or revoked sooner.       Influenza A by PCR NEGATIVE NEGATIVE Final   Influenza B by PCR NEGATIVE NEGATIVE Final    Comment: (NOTE) The Xpert Xpress SARS-CoV-2/FLU/RSV plus assay is intended as an aid in the diagnosis of influenza from Nasopharyngeal swab specimens and should not be used as a sole basis for treatment. Nasal washings and aspirates are unacceptable for Xpert Xpress  SARS-CoV-2/FLU/RSV testing.  Fact Sheet for Patients: EntrepreneurPulse.com.au  Fact Sheet for Healthcare Providers: IncredibleEmployment.be  This test is not yet approved or cleared by the [place] FDA and has been authorized for detection and/or diagnosis of SARS-CoV-2 by FDA under an Emergency Use Authorization (EUA). This EUA will remain in effect (meaning this test can be used) for the duration of the COVID-19 declaration under Section 564(b)(1) of the Act, 21 U.S.C. section 360bbb-3(b)(1), unless the authorization is terminated or revoked.  Performed at [facility], [place] [address], [place], [place] [address]     Labs: CBC: Recent Labs  Lab 06/04/21 0911  WBC 5.7  NEUTROABS 4.0  HGB 12.7*  HCT 38.4*  MCV 95.8  PLT 741*   Basic Metabolic Panel: Recent Labs  Lab 06/04/21 0911  NA 137  K 3.7  CL 107  CO2 23  GLUCOSE 123*  BUN 14  CREATININE 1.21  CALCIUM 9.5   Liver Function Tests: Recent Labs  Lab 06/04/21 0911  AST 20  ALT 13  ALKPHOS 59  BILITOT 0.8  PROT 7.5  ALBUMIN 4.1   CBG: No results for input(s): GLUCAP in the last 168 hours.  Discharge time spent: greater than 30 minutes.  Signed: Murray Hodgkins, MD [facility] 06/06/2021

## 2021-06-06 NOTE — Progress Notes (Signed)
Physical Therapy Treatment Patient Details Name: Ricky Santos MRN: [id number] DOB: November 05, 1933 Today's Date: 06/06/2021   History of Present Illness Patient is a 88 y.o. male presented to the ED for evaluation of acute onset dizziness. CT head and brain MRI negative for acute finding.  MRI showing small remote lacunar infarcts. PMH significant of essential thrombocytosis, iron deficiency anemia, MGUS, GERD, HTN, HLD, hypothyroidism, prostate cancer, chronic back pain.    PT Comments    Performed Dix hallpike and horizontal canal positional testing and pt did not have symptoms, no nystagmus observed either today.  Pt assisted with ambulating short distance however does reports feeling off-balance upon standing and presenting with retropulsion.  Pt with improved stability with use of RW.  Pt encouraged to have grandson assist with mobility using gait belt for safety at home initially.  Pt also reports brief episodes of dizziness/faintness during ambulation and vitals below upon return to sitting on bed:   06/06/21 1150  Vital Signs  Pulse Rate 85  Pulse Rate Source Monitor  BP (!) 166/83  BP Location Left Arm  BP Method Automatic  Oxygen Therapy  SpO2 100 %  O2 Device Room Air       06/06/21 1222  Symptom Behavior  Type of Dizziness  Imbalance;Lightheadedness  Frequency of Dizziness periods of dizziness with standing or ambulating  Duration of Dizziness 10 seconds or less  Symptom Nature Motion provoked  Aggravating Factors Activity in general  Relieving Factors Head stationary;Rest;Closing eyes  Progression of Symptoms Better  Oculomotor Exam  Spontaneous Absent  Comment no nystagmus observed with left gaze today however pt reports fogginess or trouble focusing  Positional Testing  Dix-Hallpike Dix-Hallpike Right;Dix-Hallpike Left  Horizontal Canal Testing Horizontal Canal Right;Horizontal Canal Left  Dix-Hallpike Right  Dix-Hallpike Right Symptoms No nystagmus  Dix-Hallpike  Left  Dix-Hallpike Left Symptoms No nystagmus  Horizontal Canal Right  Horizontal Canal Right Symptoms Normal  Horizontal Canal Left  Horizontal Canal Left Symptoms Normal     Recommendations for follow up therapy are one component of a multi-disciplinary discharge planning process, led by the attending physician.  Recommendations may be updated based on patient status, additional functional criteria and insurance authorization.  Follow Up Recommendations  Outpatient PT (vestibular if symptoms persist)     Assistance Recommended at Discharge Intermittent Supervision/Assistance  Patient can return home with the following A little help with walking and/or transfers;A little help with bathing/dressing/bathroom   Equipment Recommendations  None recommended by PT (pt reports he has a RW)    Recommendations for Other Services       Precautions / Restrictions Precautions Precautions: Fall Restrictions Weight Bearing Restrictions: No     Mobility  Bed Mobility Overal bed mobility: Modified Independent             General bed mobility comments: able to move in bed for vestibular testing, roll, move to long sitting, supine<>sit. pt taking extra time.    Transfers Overall transfer level: Needs assistance Equipment used: None Transfers: Sit to/from Stand Sit to Stand: Min assist           General transfer comment: retropulsion observed again upon standing and min assist required to stabilize balance; pt required at least 15-30 seconds standing, able to march in place prior to ambulating    Ambulation/Gait Ambulation/Gait assistance: Min assist;Min guard Gait Distance (Feet): 80 Feet Assistive device: Rolling walker (2 wheels) Gait Pattern/deviations: Step-through pattern;Decreased stride length Gait velocity: decr     General Gait Details:  pt more unsteady without UE so provided RW and stability improved, pt min/guard with RW; cues for focusing on stationary object;  pt required a couple standing breaks due to dizziness/faint feeling which last less then 10 seconds; vitals obtained upon return to sitting on EOB   Stairs             Wheelchair Mobility    Modified Rankin (Stroke Patients Only)       Balance                                            Cognition Arousal/Alertness: Awake/alert Behavior During Therapy: WFL for tasks assessed/performed Overall Cognitive Status: Within Functional Limits for tasks assessed                                          Exercises      General Comments        Pertinent Vitals/Pain Pain Assessment: No/denies pain    Home Living                          Prior Function            PT Goals (current goals can now be found in the care plan section) Progress towards PT goals: Progressing toward goals    Frequency    Min 3X/week      PT Plan Current plan remains appropriate    Co-evaluation              AM-PAC PT "6 Clicks" Mobility   Outcome Measure  Help needed turning from your back to your side while in a flat bed without using bedrails?: None Help needed moving from lying on your back to sitting on the side of a flat bed without using bedrails?: A Little Help needed moving to and from a bed to a chair (including a wheelchair)?: A Little Help needed standing up from a chair using your arms (e.g., wheelchair or bedside chair)?: A Little Help needed to walk in hospital room?: A Little Help needed climbing 3-5 steps with a railing? : A Little 6 Click Score: 19    End of Session Equipment Utilized During Treatment: Gait belt Activity Tolerance: Patient tolerated treatment well Patient left: in bed;with call bell/phone within reach;with bed alarm set   PT Visit Diagnosis: Other abnormalities of gait and mobility (R26.89);Unsteadiness on feet (R26.81)     Time: 3086-5784 PT Time Calculation (min) (ACUTE ONLY): 30  min  Charges:  $Gait Training: 8-22 mins $Physical Performance Test: 8-22 mins                    Arlyce Dice, DPT Acute Rehabilitation Services Pager: [phone] Office: [place] 06/06/2021, 12:27 PM

## 2021-06-06 NOTE — Progress Notes (Signed)
Discharge order in place.  Discharge instructions given to patient.  Encouraged to call the doctor for concerns.  Discharged home.

## 2021-06-06 NOTE — TOC Initial Note (Signed)
Transition of Care [facility]) - Initial/Assessment Note   Patient Details  Name: Ricky Santos MRN: [id number] Date of Birth: July 19, 1933  Transition of Care [facility]) CM/SW Contact:    Sherie Don, LCSW Phone Number: 06/06/2021, 10:52 AM  Clinical Narrative: PT evaluation recommended OPPT referral to address vestibular issues. Per RN, patient already has a shower chair, cane, and rolling walker at home so there are no DME needs at this time. CSW spoke with patient regarding OPPT referral. Patient is agreeable to referral. OPPT referral for [facility] submitted. CSW updated patient and recommended he schedule the first appointment on a day and time when someone can assist with transportation.  Expected Discharge Plan: OP Rehab Barriers to Discharge: Continued Medical Work up  Patient Goals and CMS Choice Patient states their goals for this hospitalization and ongoing recovery are:: Referral to OPPT to address vestibular issues  Expected Discharge Plan and Services Expected Discharge Plan: OP Rehab In-house Referral: Clinical Social Work Discharge Planning Services: Other - See comment (OPPT referral) Living arrangements for the past 2 months: Single Family Home          DME Arranged: N/A DME Agency: NA  Prior Living Arrangements/Services Living arrangements for the past 2 months: [place] Patient language and need for interpreter reviewed:: Yes Do you feel safe going back to the place where you live?: Yes      Need for Family Participation in Patient Care: Yes (Comment) Care giver support system in place?: Yes (comment) Criminal Activity/Legal Involvement Pertinent to Current Situation/Hospitalization: No - Comment as needed  Activities of Daily Living Home Assistive Devices/Equipment: Eyeglasses, Dentures (specify type) (full set dentures) ADL Screening (condition at time of admission) Patient's cognitive ability adequate to safely complete daily  activities?: Yes Is the patient deaf or have difficulty hearing?: No Does the patient have difficulty seeing, even when wearing glasses/contacts?: Yes (blurry vision with this dizziness) Does the patient have difficulty concentrating, remembering, or making decisions?: No Patient able to express need for assistance with ADLs?: Yes Does the patient have difficulty dressing or bathing?: Yes Independently performs ADLs?: No Communication: Independent Dressing (OT): Needs assistance Is this a change from baseline?: Change from baseline, expected to last >3 days Grooming: Independent Feeding: Independent Bathing: Needs assistance Is this a change from baseline?: Change from baseline, expected to last >3 days Toileting: Needs assistance Is this a change from baseline?: Change from baseline, expected to last >3days In/Out Bed: Needs assistance Is this a change from baseline?: Change from baseline, expected to last >3 days Walks in Home: Dependent Is this a change from baseline?: Change from baseline, expected to last >3 days Does the patient have difficulty walking or climbing stairs?: Yes Weakness of Legs: Both Weakness of Arms/Hands: Both  Permission Sought/Granted Permission sought to share information with : Facility Art therapist granted to share information with : Yes, Verbal Permission Granted Permission granted to share info w AGENCY: Referral to [facility] OPPT clinics  Emotional Assessment Attitude/Demeanor/Rapport: Engaged Affect (typically observed): Accepting Orientation: : Oriented to Self, Oriented to Place, Oriented to  Time, Oriented to Situation Alcohol / Substance Use: Tobacco Use Psych Involvement: No (comment)  Admission diagnosis:  Vertigo [R42] Hypertension, unspecified type [I10] Patient Active Problem List   Diagnosis Date Noted   MGUS (monoclonal gammopathy of unknown significance) 06/04/2021   HTN (hypertension) 06/04/2021   Iron deficiency  anemia 12/24/2017   Dizziness 04/02/2015   Essential thrombocytosis ([place]) 08/26/2012   PCP:  Wenda Low, MD Pharmacy:   [id number] - [place], [address] [place] [address] Phone: [phone] Fax: [phone]  Readmission Risk Interventions No flowsheet data found.

## 2021-06-11 ENCOUNTER — Telehealth: Payer: Self-pay | Admitting: Hematology

## 2021-06-11 NOTE — Telephone Encounter (Signed)
Cancelled tomorrow's appointment per patient's request due to patient's emergency. Patient stated he would call back to reschedule.

## 2021-06-12 ENCOUNTER — Inpatient Hospital Stay: Payer: Medicare Other

## 2021-06-12 ENCOUNTER — Inpatient Hospital Stay: Payer: Medicare Other | Admitting: Hematology

## 2021-06-20 ENCOUNTER — Other Ambulatory Visit: Payer: Medicare Other

## 2021-06-20 ENCOUNTER — Ambulatory Visit: Payer: Medicare Other | Admitting: Hematology

## 2021-06-26 ENCOUNTER — Encounter: Payer: Self-pay | Admitting: Physical Therapy

## 2021-06-26 ENCOUNTER — Ambulatory Visit: Payer: Medicare (Managed Care) | Attending: Family Medicine | Admitting: Physical Therapy

## 2021-06-26 ENCOUNTER — Other Ambulatory Visit: Payer: Self-pay

## 2021-06-26 DIAGNOSIS — R2681 Unsteadiness on feet: Secondary | ICD-10-CM | POA: Diagnosis not present

## 2021-06-26 DIAGNOSIS — R2689 Other abnormalities of gait and mobility: Secondary | ICD-10-CM | POA: Insufficient documentation

## 2021-06-26 DIAGNOSIS — R42 Dizziness and giddiness: Secondary | ICD-10-CM | POA: Insufficient documentation

## 2021-06-26 NOTE — Therapy (Signed)
[facility] [address] [place], [place], [address] Phone: [phone]   Fax:  [phone]  Physical Therapy Evaluation  Patient Details  Name: Ricky Santos MRN: [id number] Date of Birth: [birth year] Referring Provider (PT): Wenda Low, MD   Encounter Date: 06/26/2021   PT End of Session - 06/26/21 1957     Visit Number 1    Number of Visits 13    Date for PT Re-Evaluation 08/08/21    Authorization Type [employer]    Authorization Time Period 06-26-21 - 08-08-21    PT Start Time 1105    PT Stop Time 1145    PT Time Calculation (min) 40 min    Activity Tolerance Patient tolerated treatment well    Behavior During Therapy [facility] for tasks assessed/performed             Past Medical History:  Diagnosis Date   Back pain    Essential 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    Essential thrombocytosis ([place]) 08/26/2012   GERD (gastroesophageal reflux disease)    Hernia    Hyperlipidemia    Hypertension    Hypothyroid    Prostate cancer [facility])     Past Surgical History:  Procedure Laterality Date   BACK SURGERY     COLONOSCOPY  2003   COLONOSCOPY N/A 01/17/2013   Procedure: COLONOSCOPY;  Surgeon: Garlan Fair, MD;  Location: [facility] ENDOSCOPY;  Service: Endoscopy;  Laterality: N/A;   exp lap for trauma     HERNIA REPAIR     INCISION AND DRAINAGE ABSCESS ANAL  08/01/05   PROSTATE SURGERY     SHOULDER SURGERY     rt    There were no vitals filed for this visit.    Subjective Assessment - 06/26/21 1107     Subjective Pt reports he had sudden episode of vertigo on morning of  06-04-21, with nausea & vomiting: went to [facility] on 06-04-21 and was discharged home on 06-06-21. Pt was treated for BPPV in the hospital, but was diagnosed with vestibular neuritis in addition to the BPPV.   Pt states his biggest problem now is balance; pt reports he was independent in all  activities including driving prior to onset of vertiginous episode.  States difficulty is maintaining balance  - "when I'm up and moving around is the worst".  Pt describes dizziness as light-headedness and denies any spinning vertigo at this time.  Pt amb. from lobby to treatment room with HHA from PT due to unsteady gait.  (Grandson waiting in lobby)    Patient is accompained by: Family member   Grandson   Pertinent History HTN, MGUS, iron deficiency anemia, thrombocytosis, h/o dizziness (unknown etiology) with tx at this facility in Sept. 2016    Patient Stated Goals Improve balance and reduce the dizziness    Currently in Pain? No/denies                [facility] PT Assessment - 06/26/21 0001       Assessment   Medical Diagnosis BPPV:  Vestibular Neuritis    Referring Provider (PT) Wenda Low, MD    Onset Date/Surgical Date 06/04/21    Prior Therapy pt had PT in Sept. 2016 for dizziness (at this facility)      Precautions   Precautions Fall;Other (comment)   dizziness     Balance Screen   Has the patient fallen in the past 6 months No  Has the patient had a decrease in activity level because of a fear of falling?  No    Is the patient reluctant to leave their home because of a fear of falling?  No      Home Ecologist residence      Prior Function   Level of Independence Independent   pt is not currently driving     Observation/Other Assessments   Focus on Therapeutic Outcomes (FOTO)  DPS 55/100; risk adjusted 52/100:  DFS 56.7      Ambulation/Gait   Ambulation/Gait Yes    Ambulation/Gait Assistance 4: Min guard    Ambulation Distance (Feet) 50 Feet    Assistive device None    Gait Pattern Step-through pattern    Ambulation Surface Level;Indoor    Gait Comments Pt states he has a cane which he uses at times but did not bring today                    Vestibular Assessment - 06/26/21 1130       Vestibular Assessment    General Observation Pt is an 88 yr old gentleman with c/o dizziness, light-headedness and imbalance since vertiginous episode on 06-04-21      Symptom Behavior   Subjective history of current problem Pt reports episode started in the morning on 06-04-21; went to [facility] ED and was hospitalized until 06-06-21;  diagnosed with BPPV (treated in hospital) and vestibular neuritis;  pt reports moderate dizziness with supine to sit and with sit to stand    Type of Dizziness  Imbalance;Lightheadedness;Unsteady with head/body turns    Frequency of Dizziness worse with sit to stand    Symptom Nature Motion provoked    Aggravating Factors Activity in general;Sit to stand;Supine to sit    Progression of Symptoms No change since onset      Oculomotor Exam   Oculomotor Alignment Normal    Spontaneous Absent    Smooth Pursuits Intact    Saccades Intact    Comment pt reports trouble focusing      Positional Testing   Dix-Hallpike Dix-Hallpike Right;Dix-Hallpike Left    Sidelying Test Sidelying Right;Sidelying Left      Dix-Hallpike Right   Dix-Hallpike Right Duration none    Dix-Hallpike Right Symptoms No nystagmus   dizziness with return to upright     Dix-Hallpike Left   Dix-Hallpike Left Duration none    Dix-Hallpike Left Symptoms No nystagmus   dizziness with return to upright     Sidelying Right   Sidelying Right Duration none    Sidelying Right Symptoms No nystagmus   dizziness with return to upright sitting     Sidelying Left   Sidelying Left Duration none    Sidelying Left Symptoms No nystagmus   dizziness with return to upright sitting     Orthostatics   BP supine (x 5 minutes) 165/83   no dizziness in supine   HR supine (x 5 minutes) 72    BP sitting 148/70    HR sitting 77    BP standing (after 1 minute) 149/78    HR standing (after 1 minute) 80    BP standing (after 3 minutes) 166/81    HR standing (after 3 minutes) 78    Orthostatics Comment pt reported mild dizziness after  standing for 3"                Objective measurements completed on examination: See above findings.  PT Education - 06/26/21 1954     Education Details results of eval - no signs of BPPV noted at today's evaluation;  c/o light-headedness appear to be related to orthostatic hypotension    Person(s) Educated Patient    Methods Explanation    Comprehension Verbalized understanding              PT Short Term Goals - 06/26/21 2022       PT SHORT TERM GOAL #1   Title Perform DGI and TUG and establish LTG.    Time 3    Period Weeks    Status New    Target Date 07/18/21      PT SHORT TERM GOAL #2   Title Pt will be independent in HEP for balance and vestibular exercises.    Time 3    Period Weeks    Status New    Target Date 07/18/21      PT SHORT TERM GOAL #3   Title Pt will report at least 25% improvement in dizziness/lightheadedness.    Time 3    Period Weeks    Status New    Target Date 07/18/21               PT Long Term Goals - 06/26/21 2024       PT LONG TERM GOAL #1   Title Pt will improve DGI score by at least 3 points for reduced fall risk.    Time 6    Period Weeks    Status New    Target Date 08/08/21      PT LONG TERM GOAL #2   Title Improve TUG score by at least 4 secs to demo improved functional mobility with reduced fall risk.    Time 6    Period Weeks    Status New    Target Date 08/08/21      PT LONG TERM GOAL #3   Title Independent in updated HEP as appropriate for balance and vestibular exercises.    Time 6    Period Weeks    Status New    Target Date 08/08/21      PT LONG TERM GOAL #4   Title Improve FOTO DPS score to >/= 63/100 to demo improvement in dizziness.    Baseline DPS 55:  DFS 56.7    Time 6    Period Weeks    Status New    Target Date 08/08/21                    Plan - 06/26/21 1959     Clinical Impression Statement Pt is an 88 yr old gentleman with onset of vertigo  on 06-04-21; pt reports nausea and vomiting occurred at time of onset only.  Pt presented to [facility] ED with c/o dizziness and light-headedness and was admitted from 06-04-21 - 06-06-21.  Pt was diagnosed with BPPV (for which he received treatment while in hospital) and vestibular neuritis.  Rt and Lt Dix-Hallpike tests were negative for nystagmus and c/o vertigo in test position, however, pt reported moderate light-headedness with return to upright sitting from both Dix-Hallpike test positions and from Rt & Lt sidelying positions.  Pt does have orthostatic hypotension as BP dropped from 165/83 in supine to 148/70 with transfer to sitting position.  Pt reported moderate to severe light-headedness with supine to sitting transfer.  Pt denies any spinning vertigo at this time, but c/o imbalance and light-headedness when he is standing  or walking.  Pt states he feels the best when he is lying down.  Pt will benefit from PT to address gait and balance deficits and c/o dizziness.    Personal Factors and Comorbidities Age;Comorbidity 2;Past/Current Experience;Transportation    Comorbidities HTN, h/o vertigo in 2016, thrombocytosis, MGUS, BPPV, vestibular neuritis    Examination-Activity Limitations Locomotion Level;Stand;Squat;Stairs;Transfers;Bend    Examination-Participation Restrictions Cleaning;Driving;Laundry;Shop;Interpersonal Relationship    Stability/Clinical Decision Making Evolving/Moderate complexity    Clinical Decision Making Moderate    Rehab Potential Good    PT Frequency 2x / week   requested 2x/week for 1 week, then 1x/week x 5 weeks   PT Duration 6 weeks    PT Treatment/Interventions ADLs/Self Care Home Management;Therapeutic exercise;Therapeutic activities;Gait training;Stair training;Balance training;Neuromuscular re-education;Patient/family education;Vestibular    PT Next Visit Plan do DGI and TUG; issue balance HEP    PT Home Exercise Plan balance on floor with EO and EC, x1 viewing, SLS,  marching    Consulted and Agree with Plan of Care Patient             Patient will benefit from skilled therapeutic intervention in order to improve the following deficits and impairments:  Difficulty walking, Decreased balance, Dizziness  Visit Diagnosis: Dizziness and giddiness - Plan: PT plan of care cert/re-cert  Other abnormalities of gait and mobility - Plan: PT plan of care cert/re-cert  Unsteadiness on feet - Plan: PT plan of care cert/re-cert     Problem List Patient Active Problem List   Diagnosis Date Noted   BPPV (benign paroxysmal positional vertigo) 06/06/2021   Vestibular neuritis 06/06/2021   MGUS (monoclonal gammopathy of unknown significance) 06/04/2021   HTN (hypertension) 06/04/2021   Iron deficiency anemia 12/24/2017   Dizziness 04/02/2015   Essential thrombocytosis ([place]) 08/26/2012    Nicko Daher, [place], PT 06/26/2021, 8:32 PM  [facility] [address] [place], [place], [address] Phone: [phone]   Fax:  [phone]  Name: ANUBIS FUNDORA MRN: [id number] Date of Birth: 1933/12/09

## 2021-07-04 ENCOUNTER — Ambulatory Visit: Payer: Medicare (Managed Care) | Attending: Family Medicine

## 2021-07-04 ENCOUNTER — Other Ambulatory Visit: Payer: Self-pay

## 2021-07-04 VITALS — BP 161/80 | HR 70

## 2021-07-04 DIAGNOSIS — R2681 Unsteadiness on feet: Secondary | ICD-10-CM | POA: Diagnosis not present

## 2021-07-04 DIAGNOSIS — R42 Dizziness and giddiness: Secondary | ICD-10-CM | POA: Diagnosis not present

## 2021-07-04 DIAGNOSIS — R2689 Other abnormalities of gait and mobility: Secondary | ICD-10-CM | POA: Diagnosis not present

## 2021-07-04 NOTE — Patient Instructions (Signed)
Gaze Stabilization: Sitting    Keeping eyes on target on wall 3 feet away, tilt head down 15-30 and move head side to side for 30 seconds. Repeat while moving head up and down for 30 seconds. Do 2-3 sessions per day.    Gaze Stabilization: Tip Card  1.Target must remain in focus, not blurry, and appear stationary while head is in motion. 2.Perform exercises with small head movements (45 to either side of midline). 3.Increase speed of head motion so long as target is in focus. 4.If you wear eyeglasses, be sure you can see target through lens (therapist will give specific instructions for bifocal / progressive lenses). 5.These exercises may provoke dizziness or nausea. Work through these symptoms. If too dizzy, slow head movement slightly. Rest between each exercise. 6.Exercises demand concentration; avoid distractions. 7.For safety, perform standing exercises close to a counter, wall, corner, or next to someone.  Copyright  VHI. All rights reserved.

## 2021-07-04 NOTE — Therapy (Signed)
[facility] [address] [place], [place], [address] Phone: [phone]   Fax:  [phone]  Physical Therapy Treatment  Patient Details  Name: DALTIN CRIST MRN: [id number] Date of Birth: 08/09/1933 Referring Provider (PT): Wenda Low, MD   Encounter Date: 07/04/2021   PT End of Session - 07/04/21 0851     Visit Number 2    Number of Visits 13    Date for PT Re-Evaluation 08/08/21    Authorization Type [employer]    Authorization Time Period 06-26-21 - 08-08-21    PT Start Time 0847    PT Stop Time 0928    PT Time Calculation (min) 41 min    Activity Tolerance Patient tolerated treatment well    Behavior During Therapy [facility] for tasks assessed/performed             Past Medical History:  Diagnosis Date   Back pain    Essential thrombocytosis ([place]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    Essential thrombocytosis ([place] ) 08/26/2012   GERD (gastroesophageal reflux disease)    Hernia    Hyperlipidemia    Hypertension    Hypothyroid    Prostate cancer [facility])     Past Surgical History:  Procedure Laterality Date   BACK SURGERY     COLONOSCOPY  2003   COLONOSCOPY N/A 01/17/2013   Procedure: COLONOSCOPY;  Surgeon: Garlan Fair, MD;  Location: [facility];  Service: Endoscopy;  Laterality: N/A;   exp lap for trauma     HERNIA REPAIR     INCISION AND DRAINAGE ABSCESS ANAL  08/01/05   PROSTATE SURGERY     SHOULDER SURGERY     rt    Vitals:   07/04/21 0852  BP: (!) 161/80  Pulse: 70     Subjective Assessment - 07/04/21 0849     Subjective Patient reports no changes since initial evaluation. Reports dizziness has been about the same, reports still feels the worst when he is up and moving. Grandson in lobby.    Patient is accompained by: Family member   Grandson   Pertinent History HTN, MGUS, iron deficiency anemia, thrombocytosis, h/o dizziness (unknown etiology) with tx  at this facility in Sept. 2016    Patient Stated Goals Improve balance and reduce the dizziness    Currently in Pain? No/denies                [facility] PT Assessment - 07/04/21 0001       Standardized Balance Assessment   Standardized Balance Assessment Dynamic Gait Index;Timed Up and Go Test      Dynamic Gait Index   Level Surface Mild Impairment    Change in Gait Speed Moderate Impairment    Gait with Horizontal Head Turns Moderate Impairment    Gait with Vertical Head Turns Severe Impairment    Gait and Pivot Turn Moderate Impairment    Step Over Obstacle Moderate Impairment    Step Around Obstacles Mild Impairment    Steps Moderate Impairment    Total Score 9    DGI comment: 9/24      Timed Up and Go Test   TUG Normal TUG    Normal TUG (seconds) 16.68   no use of AD; moderate dizziness reported with turns                Vestibular Assessment - 07/04/21 0001       Oculomotor Exam-Fixation Suppressed  Left Head Impulse Positive   dizziness   Right Head Impulse Positive   dizziness     Vestibulo-Ocular Reflex   VOR 1 Head Only (x 1 viewing) difficulty maintaining eyes focused; mild dizziness    VOR Cancellation Normal   mild dizziness, but able to maintain eyes focused              Vestibular Treatment/Exercise - 07/04/21 0001       Vestibular Treatment/Exercise   Vestibular Treatment Provided Gaze    Gaze Exercises X1 Viewing Horizontal;X1 Viewing Vertical      X1 Viewing Horizontal   Foot Position seated    Reps 2    Comments x 30 seconds; mild dizziness      X1 Viewing Vertical   Foot Position seated    Reps 2    Comments x 30 seconds; mild dizziness, did reports some increased blurred vision with vertical             Gaze Stabilization: Sitting    Keeping eyes on target on wall 3 feet away, tilt head down 15-30 and move head side to side for 30 seconds. Repeat while moving head up and down for 30 seconds. Do 2-3 sessions per  day.    Gaze Stabilization: Tip Card  1.Target must remain in focus, not blurry, and appear stationary while head is in motion. 2.Perform exercises with small head movements (45 to either side of midline). 3.Increase speed of head motion so long as target is in focus. 4.If you wear eyeglasses, be sure you can see target through lens (therapist will give specific instructions for bifocal / progressive lenses). 5.These exercises may provoke dizziness or nausea. Work through these symptoms. If too dizzy, slow head movement slightly. Rest between each exercise. 6.Exercises demand concentration; avoid distractions. 7.For safety, perform standing exercises close to a counter, wall, corner, or next to someone.  Copyright  [employer]. All rights reserved.        PT Education - 07/04/21 0918     Education Details Balance Testing Results; VOR HEP    Person(s) Educated Patient    Methods Explanation    Comprehension Verbalized understanding              PT Short Term Goals - 07/04/21 1093       PT SHORT TERM GOAL #1   Title Perform DGI and TUG and establish LTG.    Baseline Assessed on 07/04/21    Time 3    Period Weeks    Status Achieved    Target Date 07/18/21      PT SHORT TERM GOAL #2   Title Pt will be independent in HEP for balance and vestibular exercises.    Time 3    Period Weeks    Status New    Target Date 07/18/21      PT SHORT TERM GOAL #3   Title Pt will report at least 25% improvement in dizziness/lightheadedness.    Time 3    Period Weeks    Status New    Target Date 07/18/21               PT Long Term Goals - 07/04/21 2355       PT LONG TERM GOAL #1   Title Pt will improve DGI score by at least 3 points for reduced fall risk.    Baseline 9/24    Time 6    Period Weeks    Status New  Target Date 08/08/21      PT LONG TERM GOAL #2   Title Improve TUG score by at least 4 secs to demo improved functional mobility with reduced fall risk.     Baseline 16.63    Time 6    Period Weeks    Status New    Target Date 08/08/21      PT LONG TERM GOAL #3   Title Independent in updated HEP as appropriate for balance and vestibular exercises.    Time 6    Period Weeks    Status New    Target Date 08/08/21      PT LONG TERM GOAL #4   Title Improve FOTO DPS score to >/= 63/100 to demo improvement in dizziness.    Baseline DPS 55:  DFS 56.7    Time 6    Period Weeks    Status New    Target Date 08/08/21                   Plan - 07/04/21 0865     Clinical Impression Statement Continued vestibular and balance assesesment today during session, patietn demonstrating B HIT with moderate dizziness indicating impaired VOR. With balance testing, patient scoring 9/24 on DGI and completed TUG in 16.68 seconds, both demonstrating increased fall risk. Rest of sesion focused one establishing VOR HEP. Mild - Mod dizziness requiring seated rest break.    Personal Factors and Comorbidities Age;Comorbidity 2;Past/Current Experience;Transportation    Comorbidities HTN, h/o vertigo in 2016, thrombocytosis, MGUS, BPPV, vestibular neuritis    Examination-Activity Limitations Locomotion Level;Stand;Squat;Stairs;Transfers;Bend    Examination-Participation Restrictions Cleaning;Driving;Laundry;Shop;Interpersonal Relationship    Stability/Clinical Decision Making Evolving/Moderate complexity    Rehab Potential Good    PT Frequency 2x / week   requested 2x/week for 1 week, then 1x/week x 5 weeks   PT Duration 6 weeks    PT Treatment/Interventions ADLs/Self Care Home Management;Therapeutic exercise;Therapeutic activities;Gait training;Stair training;Balance training;Neuromuscular re-education;Patient/family education;Vestibular    PT Next Visit Plan Reassess VOR; issue balance HEP    PT Home Exercise Plan balance on floor with EO and EC, x1 viewing, SLS, marching    Consulted and Agree with Plan of Care Patient             Patient will  benefit from skilled therapeutic intervention in order to improve the following deficits and impairments:  Difficulty walking, Decreased balance, Dizziness  Visit Diagnosis: Dizziness and giddiness  Other abnormalities of gait and mobility  Unsteadiness on feet     Problem List Patient Active Problem List   Diagnosis Date Noted   BPPV (benign paroxysmal positional vertigo) 06/06/2021   Vestibular neuritis 06/06/2021   MGUS (monoclonal gammopathy of unknown significance) 06/04/2021   HTN (hypertension) 06/04/2021   Iron deficiency anemia 12/24/2017   Dizziness 04/02/2015   Essential thrombocytosis ([place]) 08/26/2012    Jones Bales, PT, DPT 07/04/2021, 9:32 AM  [facility] [address] [place], [place], [address] Phone: [phone]   Fax:  [phone]  Name: STELLAN VICK MRN: [id number] Date of Birth: September 24, 1933

## 2021-07-08 ENCOUNTER — Other Ambulatory Visit: Payer: Self-pay

## 2021-07-08 ENCOUNTER — Ambulatory Visit: Payer: Medicare (Managed Care) | Admitting: Physical Therapy

## 2021-07-08 VITALS — BP 146/74 | HR 74

## 2021-07-08 DIAGNOSIS — R2681 Unsteadiness on feet: Secondary | ICD-10-CM

## 2021-07-08 DIAGNOSIS — R42 Dizziness and giddiness: Secondary | ICD-10-CM | POA: Diagnosis not present

## 2021-07-08 DIAGNOSIS — R2689 Other abnormalities of gait and mobility: Secondary | ICD-10-CM

## 2021-07-08 NOTE — Therapy (Signed)
[facility] [address] [place], [place], [address] Phone: [phone]   Fax:  [phone]  Physical Therapy Treatment  Patient Details  Name: Ricky Santos MRN: [id number] Date of Birth: 1934-04-18 Referring Provider (PT): Wenda Low, MD   Encounter Date: 07/08/2021   PT End of Session - 07/09/21 0905     Visit Number 3    Number of Visits 13    Date for PT Re-Evaluation 08/08/21    Authorization Type [employer]    Authorization Time Period 06-26-21 - 08-08-21    PT Start Time 0846    PT Stop Time 0930    PT Time Calculation (min) 44 min    Activity Tolerance Patient tolerated treatment well    Behavior During Therapy [facility] for tasks assessed/performed             Past Medical History:  Diagnosis Date   Back pain    Essential thrombocytosis ([place]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    Essential thrombocytosis ([place]) 08/26/2012   GERD (gastroesophageal reflux disease)    Hernia    Hyperlipidemia    Hypertension    Hypothyroid    Prostate cancer [facility])     Past Surgical History:  Procedure Laterality Date   BACK SURGERY     COLONOSCOPY  2003   COLONOSCOPY N/A 01/17/2013   Procedure: COLONOSCOPY;  Surgeon: Garlan Fair, MD;  Location: [facility];  Service: Endoscopy;  Laterality: N/A;   exp lap for trauma     HERNIA REPAIR     INCISION AND DRAINAGE ABSCESS ANAL  08/01/05   PROSTATE SURGERY     SHOULDER SURGERY     rt    Vitals:   07/08/21 0854  BP: (!) 146/74  Pulse: 74     Subjective Assessment - 07/08/21 0851     Subjective Pt reports no change in dizziness- states "I don't know what is going on";  continues to report dizziness is worse when he is up and moving.  Pt reports doing "the letter exercise" at home    Patient is accompained by: Family member   Grandson   Pertinent History HTN, MGUS, iron deficiency anemia, thrombocytosis, h/o dizziness (unknown  etiology) with tx at this facility in Sept. 2016    Patient Stated Goals Improve balance and reduce the dizziness    Currently in Pain? No/denies                SVA line 10;  DVA line 3     Vestibular Assessment - 07/09/21 0001       Visual Acuity   Static line 10    Dynamic line 4            X1 viewing exercise - 30 secs seated horizontal head turns;  30 secs seated vertical head turns - dizziness rated 5/10  Progressed to standing position - target held approx. 4' in front of patient - 60 secs x 1 rep standing -horizontal head turns Standing - vertical head turns - 30 secs only due to c/o increased dizziness with vertical compared to dizziness provoked with  Horizontal head turns - dizziness rated 5/10 after completion of each ex. In standing          [facility] Adult PT Treatment/Exercise - 07/09/21 0001       Transfers   Transfers Sit to Stand;Stand to Sit    Number of Reps Other reps (comment)   3  reps   Comments c/o dizziness upon standing - pt rated intensity 5/10      Ambulation/Gait   Ambulation/Gait Yes    Ambulation/Gait Assistance 4: Min guard    Ambulation Distance (Feet) 100 Feet    Assistive device None    Gait Pattern Step-through pattern    Ambulation Surface Level;Indoor    Gait Comments slow speed due to dizziness             Vestibular Treatment/Exercise - 07/09/21 0001       Vestibular Treatment/Exercise   Vestibular Treatment Provided Gaze    Gaze Exercises X1 Viewing Horizontal;X1 Viewing Vertical      X1 Viewing Horizontal   Foot Position seated initially -then progressed to standing position (by mat for support prn)    Time --   30 secs   Reps 2    Comments 30 secs seated; 30 secs standing      X1 Viewing Vertical   Foot Position seated position initially; progressed to standing by mat for support prn    Time --   30 secs   Reps 2    Comments 30 secs seated; 30 secs standing                Balance Exercises  - 07/09/21 0001       Balance Exercises: Standing   Standing Eyes Opened Wide (BOA);Solid surface;30 secs;Foam/compliant surface;2 reps   no sway noted on floor; moderate to mild sway noted with standing on [employer]   Standing Eyes Closed Wide (BOA);Solid surface;2 reps;30 secs   on floor - mild sway; on A[employer] inside // bars with moderate sway with pt grabbing // bars for assist with balance recovery               PT Education - 07/09/21 0903     Education Details Progressed HEP - gaze stabilization seated 60 secs for horizontal ; increased time to 60 secs for vertical but keep seated position;  added standing on FLOOR with EO with head turns and with EC with head turns - see pt instructions for details    Person(s) Educated Patient    Methods Explanation;Demonstration;Handout    Comprehension Verbalized understanding;Returned demonstration              PT Short Term Goals - 07/09/21 0913       PT SHORT TERM GOAL #1   Title Perform DGI and TUG and establish LTG.    Baseline Assessed on 07/04/21    Time 3    Period Weeks    Status Achieved    Target Date 07/18/21      PT SHORT TERM GOAL #2   Title Pt will be independent in HEP for balance and vestibular exercises.    Time 3    Period Weeks    Status New    Target Date 07/18/21      PT SHORT TERM GOAL #3   Title Pt will report at least 25% improvement in dizziness/lightheadedness.    Time 3    Period Weeks    Status New    Target Date 07/18/21               PT Long Term Goals - 07/09/21 0913       PT LONG TERM GOAL #1   Title Pt will improve DGI score by at least 3 points for reduced fall risk.    Baseline 9/24    Time 6  Period Weeks    Status New    Target Date 08/08/21      PT LONG TERM GOAL #2   Title Improve TUG score by at least 4 secs to demo improved functional mobility with reduced fall risk.    Baseline 16.63    Time 6    Period Weeks    Status New    Target Date 08/08/21      PT  LONG TERM GOAL #3   Title Independent in updated HEP as appropriate for balance and vestibular exercises.    Time 6    Period Weeks    Status New    Target Date 08/08/21      PT LONG TERM GOAL #4   Title Improve FOTO DPS score to >/= 63/100 to demo improvement in dizziness.    Baseline DPS 55:  DFS 56.7    Time 6    Period Weeks    Status New    Target Date 08/08/21                   Plan - 07/09/21 0907     Clinical Impression Statement Session focused on continued vestibular assessment with pt having abnormal VOR with a 6 line difference between SVA (line 10) and DVA (line 4).  Pt has much difficulty maintaining balance on compliant surface with EC, indicative of decreased vestibular input in maintaining balance.  Pt's gait appears to be more steady compared to performance at initial eval when pt used wall for stability at times.  Pt rated dizziness 8-9/10 intensity at start of session; rated dizziness 5/10 during session after completing x1 viewing vertically in standing, but then rated dizziness 3/10 intensity at end of session.  Etiology of dizziness appears to possibly be of central origin.  Cont with POC.    Personal Factors and Comorbidities Age;Comorbidity 2;Past/Current Experience;Transportation    Comorbidities HTN, h/o vertigo in 2016, thrombocytosis, MGUS, BPPV, vestibular neuritis    Examination-Activity Limitations Locomotion Level;Stand;Squat;Stairs;Transfers;Bend    Examination-Participation Restrictions Cleaning;Driving;Laundry;Shop;Interpersonal Relationship    Stability/Clinical Decision Making Evolving/Moderate complexity    Rehab Potential Good    PT Frequency 2x / week   requested 2x/week for 1 week, then 1x/week x 5 weeks   PT Duration 6 weeks    PT Treatment/Interventions ADLs/Self Care Home Management;Therapeutic exercise;Therapeutic activities;Gait training;Stair training;Balance training;Neuromuscular re-education;Patient/family education;Vestibular     PT Next Visit Plan Reassess VOR; issue balance HEP    PT Home Exercise Plan balance on floor with EO and EC, x1 viewing, SLS, marching    Consulted and Agree with Plan of Care Patient             Patient will benefit from skilled therapeutic intervention in order to improve the following deficits and impairments:  Difficulty walking, Decreased balance, Dizziness  Visit Diagnosis: Dizziness and giddiness  Other abnormalities of gait and mobility  Unsteadiness on feet     Problem List Patient Active Problem List   Diagnosis Date Noted   BPPV (benign paroxysmal positional vertigo) 06/06/2021   Vestibular neuritis 06/06/2021   MGUS (monoclonal gammopathy of unknown significance) 06/04/2021   HTN (hypertension) 06/04/2021   Iron deficiency anemia 12/24/2017   Dizziness 04/02/2015   Essential thrombocytosis ([place]) 08/26/2012    Ricky Santos, [place], PT 07/09/2021, 9:16 AM  [facility] [address] [place], [place], [address] Phone: [phone]   Fax:  [phone]  Name: Ricky Santos MRN: [id number] Date of Birth: 09-11-1933

## 2021-07-08 NOTE — Patient Instructions (Addendum)
°  Feet Apart (Compliant Surface) Head Motion - Eyes Open, then eyes closed ; on FLOOR - corner    With eyes open, standing on compliant surface: __FLOOR for now______, feet shoulder width apart, move head slowly: up and down. Repeat _1_ times per session. Do __2__ sessions per day.  1) stand with eyes open on floor for 10-15 secs 2) turn head side to side 5 times, then up /down 5 times - find a target for each    EYES Closed: Stand for 30 secs with Eyes closed - no head turns -just holding steady  1) - eyes closed - head turns side to side 5 times and up/down 5 times        Increase time with letter exercise to 60 secs in STANDING for side to side - have object in front & back for safety   UP/DOWN - keep seated position - increase time to 60 secs

## 2021-07-11 ENCOUNTER — Ambulatory Visit: Payer: Medicare (Managed Care)

## 2021-07-11 ENCOUNTER — Other Ambulatory Visit: Payer: Self-pay

## 2021-07-11 VITALS — BP 145/76 | HR 71

## 2021-07-11 DIAGNOSIS — R2681 Unsteadiness on feet: Secondary | ICD-10-CM

## 2021-07-11 DIAGNOSIS — R42 Dizziness and giddiness: Secondary | ICD-10-CM

## 2021-07-11 DIAGNOSIS — R2689 Other abnormalities of gait and mobility: Secondary | ICD-10-CM

## 2021-07-11 NOTE — Therapy (Signed)
[facility] [address] [place], [place], [address] Phone: [phone]   Fax:  [phone]  Physical Therapy Treatment  Patient Details  Name: Ricky Santos MRN: [id number] Date of Birth: [birth year] Referring Provider (PT): Wenda Low, MD   Encounter Date: 07/11/2021   PT End of Session - 07/11/21 0849     Visit Number 4    Number of Visits 13    Date for PT Re-Evaluation 08/08/21    Authorization Type [employer]    Authorization Time Period 06-26-21 - 08-08-21    PT Start Time 0846    PT Stop Time 0929    PT Time Calculation (min) 43 min    Activity Tolerance Patient tolerated treatment well    Behavior During Therapy [facility] for tasks assessed/performed             Past Medical History:  Diagnosis Date   Back pain    Essential thrombocytosis ([place]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    Essential thrombocytosis ([place]) 08/26/2012   GERD (gastroesophageal reflux disease)    Hernia    Hyperlipidemia    Hypertension    Hypothyroid    Prostate cancer [facility])     Past Surgical History:  Procedure Laterality Date   BACK SURGERY     COLONOSCOPY  2003   COLONOSCOPY N/A 01/17/2013   Procedure: COLONOSCOPY;  Surgeon: Garlan Fair, MD;  Location: [facility];  Service: Endoscopy;  Laterality: N/A;   exp lap for trauma     HERNIA REPAIR     INCISION AND DRAINAGE ABSCESS ANAL  08/01/05   PROSTATE SURGERY     SHOULDER SURGERY     rt    Vitals:   07/11/21 0850  BP: (!) 145/76  Pulse: 71     Subjective Assessment - 07/11/21 0848     Subjective Reports that the dizziness continues to fluctuate. Feels unsteady this morning. No falls or new changes to report.    Patient is accompained by: Family member   Grandson   Pertinent History HTN, MGUS, iron deficiency anemia, thrombocytosis, h/o dizziness (unknown etiology) with tx at this facility in Sept. 2016    Patient Stated Goals  Improve balance and reduce the dizziness    Currently in Pain? No/denies                 Vestibular Treatment/Exercise - 07/11/21 0001       Vestibular Treatment/Exercise   Vestibular Treatment Provided Gaze    Gaze Exercises X1 Viewing Horizontal;X1 Viewing Vertical      X1 Viewing Horizontal   Foot Position bil stance    Reps 2    Comments x 60 seconds; very mild dizziness      X1 Viewing Vertical   Foot Position bil stance    Reps 2    Comments x 60 seconds; no dizziness reported today.                Balance Exercises - 07/11/21 0001       Balance Exercises: Standing   Standing Eyes Opened Wide (BOA);Foam/compliant surface;Limitations    Standing Eyes Opened Limitations on airex with bil stance, completed horizontal/vertical head turns x 10 reps.    Standing Eyes Closed Wide (BOA);Foam/compliant surface;2 reps;30 secs;Limitations    Standing Eyes Closed Limitations on airex with bil stance completed EC 2 x 30 seconds, then trialed EC with horizontal/vertical head turns x 10 reps each. With vertical 1-2  instances of anterior sway requiring UE and CGA to maintain balance.    Stepping Strategy Anterior;Foam/compliant surface;Limitations    Stepping Strategy Limitations standing across blue balance beam completed alternating anterior steps forward/back onto beam, completed x 10 reps progressing from BUE support to single UE support.    Gait with Head Turns Forward;Limitations    Gait with Head Turns Limitations completed forward ambulation x 300 ft with horizontal/vertical head turns, more challenge noted/imbalance and reports increased dizziness with vertical > horizontal. CGA due to unsteadiness.    Tandem Gait Forward;Upper extremity support;3 reps;Limitations    Tandem Gait Limitations on firm surface, completed tandem gait forwards x 3 laps with single UE support. cues for light touch.    Sidestepping Foam/compliant support;2 reps;Limitations    Sidestepping  Limitations in // bars completed, lateral sidestepping down the blue balance beam, x 2 laps. slowly progressing from bil UE support > single UE > no UE support.                  PT Short Term Goals - 07/09/21 0913       PT SHORT TERM GOAL #1   Title Perform DGI and TUG and establish LTG.    Baseline Assessed on 07/04/21    Time 3    Period Weeks    Status Achieved    Target Date 07/18/21      PT SHORT TERM GOAL #2   Title Pt will be independent in HEP for balance and vestibular exercises.    Time 3    Period Weeks    Status New    Target Date 07/18/21      PT SHORT TERM GOAL #3   Title Pt will report at least 25% improvement in dizziness/lightheadedness.    Time 3    Period Weeks    Status New    Target Date 07/18/21               PT Long Term Goals - 07/09/21 0913       PT LONG TERM GOAL #1   Title Pt will improve DGI score by at least 3 points for reduced fall risk.    Baseline 9/24    Time 6    Period Weeks    Status New    Target Date 08/08/21      PT LONG TERM GOAL #2   Title Improve TUG score by at least 4 secs to demo improved functional mobility with reduced fall risk.    Baseline 16.63    Time 6    Period Weeks    Status New    Target Date 08/08/21      PT LONG TERM GOAL #3   Title Independent in updated HEP as appropriate for balance and vestibular exercises.    Time 6    Period Weeks    Status New    Target Date 08/08/21      PT LONG TERM GOAL #4   Title Improve FOTO DPS score to >/= 63/100 to demo improvement in dizziness.    Baseline DPS 55:  DFS 56.7    Time 6    Period Weeks    Status New    Target Date 08/08/21                   Plan - 07/11/21 1015     Clinical Impression Statement Continued session focused on activities to progress balance with vestibular component, most challenge continue to be noted  with vison removed and vertical head truns. No significant dizziness noted with balance activities. With VOR  continued progression, wiht patient able to tolerate standing and 60 seconds with only mild dizziness. Will continue per POC.    Personal Factors and Comorbidities Age;Comorbidity 2;Past/Current Experience;Transportation    Comorbidities HTN, h/o vertigo in 2016, thrombocytosis, MGUS, BPPV, vestibular neuritis    Examination-Activity Limitations Locomotion Level;Stand;Squat;Stairs;Transfers;Bend    Examination-Participation Restrictions Cleaning;Driving;Laundry;Shop;Interpersonal Relationship    Stability/Clinical Decision Making Evolving/Moderate complexity    Rehab Potential Good    PT Frequency 2x / week   requested 2x/week for 1 week, then 1x/week x 5 weeks   PT Duration 6 weeks    PT Treatment/Interventions ADLs/Self Care Home Management;Therapeutic exercise;Therapeutic activities;Gait training;Stair training;Balance training;Neuromuscular re-education;Patient/family education;Vestibular    PT Next Visit Plan Continue VOR progression. Balance with focus on vestibular component. High level balance.    PT Home Exercise Plan balance on floor with EO and EC, x1 viewing, SLS, marching    Consulted and Agree with Plan of Care Patient             Patient will benefit from skilled therapeutic intervention in order to improve the following deficits and impairments:  Difficulty walking, Decreased balance, Dizziness  Visit Diagnosis: Dizziness and giddiness  Other abnormalities of gait and mobility  Unsteadiness on feet     Problem List Patient Active Problem List   Diagnosis Date Noted   BPPV (benign paroxysmal positional vertigo) 06/06/2021   Vestibular neuritis 06/06/2021   MGUS (monoclonal gammopathy of unknown significance) 06/04/2021   HTN (hypertension) 06/04/2021   Iron deficiency anemia 12/24/2017   Dizziness 04/02/2015   Essential thrombocytosis ([place]) 08/26/2012    Jones Bales, PT, DPT 07/11/2021, 10:18 AM  [facility] [address] [place], [place], [address] Phone: [phone]   Fax:  [phone]  Name: Ricky Santos MRN: [id number] Date of Birth: 1934-01-29

## 2021-07-18 ENCOUNTER — Ambulatory Visit: Payer: Medicare (Managed Care)

## 2021-07-18 ENCOUNTER — Other Ambulatory Visit: Payer: Self-pay

## 2021-07-18 DIAGNOSIS — R42 Dizziness and giddiness: Secondary | ICD-10-CM

## 2021-07-18 DIAGNOSIS — R2689 Other abnormalities of gait and mobility: Secondary | ICD-10-CM

## 2021-07-18 DIAGNOSIS — R2681 Unsteadiness on feet: Secondary | ICD-10-CM

## 2021-07-18 NOTE — Therapy (Signed)
[facility] [address] [place], [place], [address] Phone: [phone]   Fax:  [phone]  Physical Therapy Treatment  Patient Details  Name: Ricky Santos MRN: [id number] Date of Birth: [birth year] Referring Provider (PT): Wenda Low, MD   Encounter Date: 07/18/2021   PT End of Session - 07/18/21 0936     Visit Number 5    Number of Visits 13    Date for PT Re-Evaluation 08/08/21    Authorization Type [employer]    Authorization Time Period 06-26-21 - 08-08-21    PT Start Time 0932    PT Stop Time 1014    PT Time Calculation (min) 42 min    Activity Tolerance Patient tolerated treatment well    Behavior During Therapy [facility] for tasks assessed/performed             Past Medical History:  Diagnosis Date   Back pain    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    Essential thrombocytosis ([place]) 08/26/2012   GERD (gastroesophageal reflux disease)    Hernia    Hyperlipidemia    Hypertension    Hypothyroid    Prostate cancer [facility])     Past Surgical History:  Procedure Laterality Date   BACK SURGERY     COLONOSCOPY  2003   COLONOSCOPY N/A 01/17/2013   Procedure: COLONOSCOPY;  Surgeon: Garlan Fair, MD;  [facility];  Service: Endoscopy;  Laterality: N/A;   exp lap for trauma     HERNIA REPAIR     INCISION AND DRAINAGE ABSCESS ANAL  08/01/05   PROSTATE SURGERY     SHOULDER SURGERY     rt    There were no vitals filed for this visit.   Subjective Assessment - 07/18/21 0935     Subjective Patient reports feels like he is getting better. I feel 85% better. No falls or changes to report.    Patient is accompained by: Family member   Grandson   Pertinent History HTN, MGUS, iron deficiency anemia, thrombocytosis, h/o dizziness (unknown etiology) with tx at this facility in Sept. 2016    Patient Stated Goals Improve balance and reduce  the dizziness    Currently in Pain? No/denies              Vestibular Treatment/Exercise - 07/18/21 0001       Vestibular Treatment/Exercise   Vestibular Treatment Provided Gaze    Gaze Exercises X1 Viewing Horizontal;X1 Viewing Vertical      X1 Viewing Horizontal   Foot Position bil stance on foam    Reps 2    Comments x 60 seconds; no dizziness      X1 Viewing Vertical   Foot Position bil stance on foam    Reps 2    Comments x 60 seconds; no dizziness. mild sway noted               Balance Exercises - 07/18/21 0001       Balance Exercises: Standing   SLS with Vectors Foam/compliant surface;Intermittent upper extremity assist;Limitations    SLS with Vectors Limitations standing on airex at first step: completed alternating toe taps forward to 1st step x 10 reps bilat, then transitioned to crossover toe taps x 10 reps bilat. Intermittent UE support required with crossover due to unsteadiness. CGA required intermittent    Stepping Strategy Anterior;Foam/compliant surface;Limitations    Stepping Strategy Limitations standing across red balance beam completed  alternating anterior steps forward/back onto beam, completed x 15 reps bilat progressing from single UE support to no UE support    Balance Beam standing across red balance beam maintaining balance, completed horizontal/vertical head turns x 10 reps each direction. mild increased sway with vertical > horizontal    Gait with Head Turns Forward;Limitations    Gait with Head Turns Limitations completed forward ambulation x 400 ft with horizontal/vertical head turns, intermittent veering noted but able to maintain balance without PT assistance    Tandem Gait Forward;Intermittent upper extremity support;Limitations    Tandem Gait Limitations on firm surface, completed tandem gait forwards x 2 laps with single UE support, then x 2 laps without UE support. CGA    Sit to Stand Standard surface;Foam/compliant  surface;Limitations    Sit to Stand Limitations completed sit <> stands x 10 reps with BLE on airex. supervision    Other Standing Exercises completed gait with eyes closed, 5 x 10', progressing from single UE support to no UE support               PT Short Term Goals - 07/09/21 0913       PT SHORT TERM GOAL #1   Title Perform DGI and TUG and establish LTG.    Baseline Assessed on 07/04/21    Time 3    Period Weeks    Status Achieved    Target Date 07/18/21      PT SHORT TERM GOAL #2   Title Pt will be independent in HEP for balance and vestibular exercises.    Time 3    Period Weeks    Status New    Target Date 07/18/21      PT SHORT TERM GOAL #3   Title Pt will report at least 25% improvement in dizziness/lightheadedness.    Time 3    Period Weeks    Status New    Target Date 07/18/21               PT Long Term Goals - 07/09/21 0913       PT LONG TERM GOAL #1   Title Pt will improve DGI score by at least 3 points for reduced fall risk.    Baseline 9/24    Time 6    Period Weeks    Status New    Target Date 08/08/21      PT LONG TERM GOAL #2   Title Improve TUG score by at least 4 secs to demo improved functional mobility with reduced fall risk.    Baseline 16.63    Time 6    Period Weeks    Status New    Target Date 08/08/21      PT LONG TERM GOAL #3   Title Independent in updated HEP as appropriate for balance and vestibular exercises.    Time 6    Period Weeks    Status New    Target Date 08/08/21      PT LONG TERM GOAL #4   Title Improve FOTO DPS score to >/= 63/100 to demo improvement in dizziness.    Baseline DPS 55:  DFS 56.7    Time 6    Period Weeks    Status New    Target Date 08/08/21                   Plan - 07/18/21 1020     Clinical Impression Statement Continued progression of VOR to complaint surface, mild  sway but no reports of dizziness demonstrating significant improvements. Continued high level balance  activities targeting vestibular system, and SLS activities. Will continue per POC.    Personal Factors and Comorbidities Age;Comorbidity 2;Past/Current Experience;Transportation    Comorbidities HTN, h/o vertigo in 2016, thrombocytosis, MGUS, BPPV, vestibular neuritis    Examination-Activity Limitations Locomotion Level;Stand;Squat;Stairs;Transfers;Bend    Examination-Participation Restrictions Cleaning;Driving;Laundry;Shop;Interpersonal Relationship    Stability/Clinical Decision Making Evolving/Moderate complexity    Rehab Potential Good    PT Frequency 2x / week   requested 2x/week for 1 week, then 1x/week x 5 weeks   PT Duration 6 weeks    PT Treatment/Interventions ADLs/Self Care Home Management;Therapeutic exercise;Therapeutic activities;Gait training;Stair training;Balance training;Neuromuscular re-education;Patient/family education;Vestibular    PT Next Visit Plan Check LTGs and potential d/c or re-cert.    PT Home Exercise Plan balance on floor with EO and EC, x1 viewing, SLS, marching    Consulted and Agree with Plan of Care Patient             Patient will benefit from skilled therapeutic intervention in order to improve the following deficits and impairments:  Difficulty walking, Decreased balance, Dizziness  Visit Diagnosis: Dizziness and giddiness  Other abnormalities of gait and mobility  Unsteadiness on feet     Problem List Patient Active Problem List   Diagnosis Date Noted   BPPV (benign paroxysmal positional vertigo) 06/06/2021   Vestibular neuritis 06/06/2021   MGUS (monoclonal gammopathy of unknown significance) 06/04/2021   HTN (hypertension) 06/04/2021   Iron deficiency anemia 12/24/2017   Dizziness 04/02/2015   Essential thrombocytosis ([place]) 08/26/2012    Jones Bales, PT, DPT 07/18/2021, 10:23 AM  [facility] [address] [place], [place], [address] Phone: [phone]   Fax:   [phone]  Name: MALEKE FERIA MRN: [id number] Date of Birth: Sep 01, 1933

## 2021-07-25 ENCOUNTER — Other Ambulatory Visit: Payer: Self-pay

## 2021-07-25 ENCOUNTER — Ambulatory Visit: Payer: Medicare (Managed Care)

## 2021-07-25 DIAGNOSIS — R42 Dizziness and giddiness: Secondary | ICD-10-CM

## 2021-07-25 DIAGNOSIS — R2689 Other abnormalities of gait and mobility: Secondary | ICD-10-CM

## 2021-07-25 DIAGNOSIS — R2681 Unsteadiness on feet: Secondary | ICD-10-CM

## 2021-07-25 NOTE — Therapy (Signed)
[facility] [address] [place], [place], [address] Phone: [phone]   Fax:  [phone]  Physical Therapy Treatment/Discharge Summary  Patient Details  Name: Ricky Santos MRN: [id number] Date of Birth: 1934-03-23 Referring Provider (PT): Wenda Low, MD  PHYSICAL THERAPY DISCHARGE SUMMARY  Visits from Start of Care: 6  Current functional level related to goals / functional outcomes: See Clinical Impression Statement for Details   Remaining deficits: Mild Dizziness   Education / Equipment: HEP Provided   Patient agrees to discharge. Patient goals were met. Patient is being discharged due to meeting the stated rehab goals.  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    Essential thrombocytosis ([place]) 08/26/2012   GERD (gastroesophageal reflux disease)    Hernia    Hyperlipidemia    Hypertension    Hypothyroid    Prostate cancer [facility])     Past Surgical History:  Procedure Laterality Date   BACK SURGERY     COLONOSCOPY  2003   COLONOSCOPY N/A 01/17/2013   Procedure: COLONOSCOPY;  Surgeon: Garlan Fair, MD;  [facility];  Service: Endoscopy;  Laterality: N/A;   exp lap for trauma     HERNIA REPAIR     INCISION AND DRAINAGE ABSCESS ANAL  08/01/05   PROSTATE SURGERY     SHOULDER SURGERY     rt    There were no vitals filed for this visit.    Subjective Assessment - 07/25/21 0933     Subjective Patient reports no new changes. Still having a mild dizziness sensation that never goes away but is manageable. No pain. No falls.    Patient is accompained by: Family member   Grandson   Pertinent History HTN, MGUS, iron deficiency anemia, thrombocytosis, h/o dizziness (unknown etiology) with tx at this facility in Sept. 2016    Patient Stated Goals Improve balance and reduce the dizziness    Currently in Pain? No/denies                [facility] PT Assessment - 07/25/21 0001       Assessment   Medical Diagnosis BPPV:  Vestibular Neuritis    Referring Provider (PT) Wenda Low, MD      Observation/Other Assessments   Focus on Therapeutic Outcomes (FOTO)  DPS: 66%; DFS: 67.4%      Standardized Balance Assessment   Standardized Balance Assessment Dynamic Gait Index;Timed Up and Go Test      Dynamic Gait Index   Level Surface Normal    Change in Gait Speed Normal    Gait with Horizontal Head Turns Mild Impairment    Gait with Vertical Head Turns Mild Impairment    Gait and Pivot Turn Normal    Step Over Obstacle Normal  Step Around Obstacles Normal    Steps Mild Impairment    Total Score 21    DGI comment: 21/24      Timed Up and Go Test   TUG Normal TUG    Normal TUG (seconds) 10.28   no use of AD, no unsteadiness noted              Vestibular Treatment/Exercise - 07/25/21 0001       Vestibular Treatment/Exercise   Vestibular Treatment Provided Gaze    Gaze Exercises X1 Viewing Horizontal;X1 Viewing Vertical      X1 Viewing Horizontal   Foot Position narrow BOS on firm surface    Reps 1    Comments x 60 seconds; no dizziness. updated HEP      X1 Viewing Vertical   Foot Position narrow BOS on firm surface    Reps 1    Comments x 60 seconds; no dizziness. updated HEP             Updated HEP:  Gaze Stabilization: Standing Feet Together    Feet together, keeping eyes on target on wall 3-4  feet away, tilt head down 15-30 and move head side to side for 60 seconds. Repeat while moving head up and down for 60 seconds. Do 2-3 sessions per day.  Copyright  VHI. All rights reserved.       PT Education - 07/25/21 0951     Education Details Updated HEP; Progress toward LTGs    Person(s) Educated Patient    Methods Explanation;Demonstration;Handout    Comprehension Verbalized understanding;Returned demonstration              PT Short Term Goals - 07/09/21 0913       PT SHORT TERM GOAL #1   Title Perform DGI and TUG and establish LTG.    Baseline Assessed on 07/04/21    Time 3    Period Weeks    Status Achieved    Target Date 07/18/21      PT SHORT TERM GOAL #2   Title Pt will be independent in HEP for balance and vestibular exercises.    Time 3    Period Weeks    Status New    Target Date 07/18/21      PT SHORT TERM GOAL #3   Title Pt will report at least 25% improvement in dizziness/lightheadedness.    Time 3    Period Weeks    Status New    Target Date 07/18/21               PT Long Term Goals - 07/25/21 0939       PT LONG TERM GOAL #1   Title Pt will improve DGI score by at least 3 points for reduced fall risk.    Baseline 9/24; 21/24    Time 6    Period Weeks    Status Achieved    Target Date 08/08/21      PT LONG TERM GOAL #2   Title Improve TUG score by at least 4 secs to demo improved functional mobility with reduced fall risk.    Baseline 16.63; 10.28 secs    Time 6    Period Weeks    Status Achieved    Target Date 08/08/21      PT LONG TERM GOAL #3   Title Independent in updated HEP as appropriate for balance and vestibular exercises.    Baseline reports independence in HEP    Time 6  Period Weeks    Status Achieved    Target Date 08/08/21      PT LONG TERM GOAL #4   Title Improve FOTO DPS score to >/= 63/100 to demo improvement in dizziness.    Baseline DPS 55:  DFS 56.7; DPS: 66%, DFS: 67.4%    Time 6    Period  Weeks    Status Achieved    Target Date 08/08/21                   Plan - 07/25/21 8590     Clinical Impression Statement Completed assesment of patient's progress toward LTGs today. Patient able to meet all LTGs at this time, demonstrating improved balance on DGI to 21/24, improved TUG of 10.28 seconds demonstrating reduced risk for falls. Patient continue to have mild dizziness but self management able to be completed. Pt improved DPS to 66% and DFS: 67.4%. Reviewed HEP and updated. Patient demo readiness to d/c from PT services at this time, with patient agreeable.    Personal Factors and Comorbidities Age;Comorbidity 2;Past/Current Experience;Transportation    Comorbidities HTN, h/o vertigo in 2016, thrombocytosis, MGUS, BPPV, vestibular neuritis    Examination-Activity Limitations Locomotion Level;Stand;Squat;Stairs;Transfers;Bend    Examination-Participation Restrictions Cleaning;Driving;Laundry;Shop;Interpersonal Relationship    Stability/Clinical Decision Making Evolving/Moderate complexity    Rehab Potential Good    PT Frequency 2x / week   requested 2x/week for 1 week, then 1x/week x 5 weeks   PT Duration 6 weeks    PT Treatment/Interventions ADLs/Self Care Home Management;Therapeutic exercise;Therapeutic activities;Gait training;Stair training;Balance training;Neuromuscular re-education;Patient/family education;Vestibular    PT Next Visit Plan --    PT Home Exercise Plan balance on floor with EO and EC, x1 viewing, SLS, marching    Consulted and Agree with Plan of Care Patient             Patient will benefit from skilled therapeutic intervention in order to improve the following deficits and impairments:  Difficulty walking, Decreased balance, Dizziness  Visit Diagnosis: Dizziness and giddiness  Other abnormalities of gait and mobility  Unsteadiness on feet     Problem List Patient Active Problem List   Diagnosis Date Noted   BPPV (benign paroxysmal  positional vertigo) 06/06/2021   Vestibular neuritis 06/06/2021   MGUS (monoclonal gammopathy of unknown significance) 06/04/2021   HTN (hypertension) 06/04/2021   Iron deficiency anemia 12/24/2017   Dizziness 04/02/2015   Essential thrombocytosis ([place]) 08/26/2012    Jones Bales, PT, DPT 07/25/2021, 10:00 AM  [facility] [address] [place], [place], [address] Phone: [phone]   Fax:  [phone]  Name: Ricky Santos MRN: [id number] Date of Birth: [birth year]

## 2021-07-25 NOTE — Patient Instructions (Signed)
Gaze Stabilization: Standing Feet Together    Feet together, keeping eyes on target on wall 3-4 feet away, tilt head down 15-30 and move head side to side for 60 seconds. Repeat while moving head up and down for 60 seconds. Do 2-3 sessions per day.  Copyright  VHI. All rights reserved.

## 2021-08-07 ENCOUNTER — Inpatient Hospital Stay: Payer: Medicare (Managed Care) | Attending: Hematology

## 2021-08-07 ENCOUNTER — Encounter: Payer: Self-pay | Admitting: Hematology

## 2021-08-07 ENCOUNTER — Inpatient Hospital Stay (HOSPITAL_BASED_OUTPATIENT_CLINIC_OR_DEPARTMENT_OTHER): Payer: Medicare (Managed Care) | Admitting: Hematology

## 2021-08-07 ENCOUNTER — Other Ambulatory Visit: Payer: Self-pay

## 2021-08-07 VITALS — BP 142/68 | HR 73 | Temp 98.2°F | Resp 18 | Ht 69.0 in | Wt 158.1 lb

## 2021-08-07 DIAGNOSIS — E785 Hyperlipidemia, unspecified: Secondary | ICD-10-CM | POA: Diagnosis not present

## 2021-08-07 DIAGNOSIS — Z79899 Other long term (current) drug therapy: Secondary | ICD-10-CM | POA: Insufficient documentation

## 2021-08-07 DIAGNOSIS — Z8546 Personal history of malignant neoplasm of prostate: Secondary | ICD-10-CM | POA: Insufficient documentation

## 2021-08-07 DIAGNOSIS — D472 Monoclonal gammopathy: Secondary | ICD-10-CM | POA: Insufficient documentation

## 2021-08-07 DIAGNOSIS — E039 Hypothyroidism, unspecified: Secondary | ICD-10-CM | POA: Insufficient documentation

## 2021-08-07 DIAGNOSIS — D509 Iron deficiency anemia, unspecified: Secondary | ICD-10-CM | POA: Diagnosis not present

## 2021-08-07 DIAGNOSIS — I10 Essential (primary) hypertension: Secondary | ICD-10-CM | POA: Diagnosis not present

## 2021-08-07 DIAGNOSIS — D473 Essential (hemorrhagic) thrombocythemia: Secondary | ICD-10-CM | POA: Diagnosis not present

## 2021-08-07 DIAGNOSIS — K219 Gastro-esophageal reflux disease without esophagitis: Secondary | ICD-10-CM | POA: Diagnosis not present

## 2021-08-07 DIAGNOSIS — Z7982 Long term (current) use of aspirin: Secondary | ICD-10-CM | POA: Diagnosis not present

## 2021-08-07 LAB — CMP (CANCER CENTER ONLY)
ALT: 8 U/L (ref 0–44)
AST: 18 U/L (ref 15–41)
Albumin: 4.3 g/dL (ref 3.5–5.0)
Alkaline Phosphatase: 68 U/L (ref 38–126)
Anion gap: 8 (ref 5–15)
BUN: 18 mg/dL (ref 8–23)
CO2: 25 mmol/L (ref 22–32)
Calcium: 9.5 mg/dL (ref 8.9–10.3)
Chloride: 107 mmol/L (ref 98–111)
Creatinine: 1.4 mg/dL — ABNORMAL HIGH (ref 0.61–1.24)
GFR, Estimated: 49 mL/min — ABNORMAL LOW (ref >60)
Glucose, Bld: 118 mg/dL — ABNORMAL HIGH (ref 70–99)
Potassium: 4.1 mmol/L (ref 3.5–5.1)
Sodium: 140 mmol/L (ref 135–145)
Total Bilirubin: 0.5 mg/dL (ref 0.3–1.2)
Total Protein: 7.3 g/dL (ref 6.5–8.1)

## 2021-08-07 LAB — CBC WITH DIFFERENTIAL (CANCER CENTER ONLY)
Abs Immature Granulocytes: 0.02 10*3/uL (ref 0.00–0.07)
Basophils Absolute: 0.1 10*3/uL (ref 0.0–0.1)
Basophils Relative: 2 %
Eosinophils Absolute: 0.2 10*3/uL (ref 0.0–0.5)
Eosinophils Relative: 5 %
HCT: 36.6 % — ABNORMAL LOW (ref 39.0–52.0)
Hemoglobin: 12.4 g/dL — ABNORMAL LOW (ref 13.0–17.0)
Immature Granulocytes: 0 %
Lymphocytes Relative: 32 %
Lymphs Abs: 1.6 10*3/uL (ref 0.7–4.0)
MCH: 33.4 pg (ref 26.0–34.0)
MCHC: 33.9 g/dL (ref 30.0–36.0)
MCV: 98.7 fL (ref 80.0–100.0)
Monocytes Absolute: 0.4 10*3/uL (ref 0.1–1.0)
Monocytes Relative: 8 %
Neutro Abs: 2.7 10*3/uL (ref 1.7–7.7)
Neutrophils Relative %: 53 %
Platelet Count: 458 10*3/uL — ABNORMAL HIGH (ref 150–400)
RBC: 3.71 MIL/uL — ABNORMAL LOW (ref 4.22–5.81)
RDW: 15.7 % — ABNORMAL HIGH (ref 11.5–15.5)
WBC Count: 5.1 10*3/uL (ref 4.0–10.5)
nRBC: 0 % (ref 0.0–0.2)

## 2021-08-07 MED ORDER — HYDROXYUREA 500 MG PO CAPS: ORAL_CAPSULE | ORAL | 1 refills | Status: DC

## 2021-08-07 NOTE — Progress Notes (Signed)
?[place]   ?Telephone:(336) [phone] [phone]   ?Clinic Follow up Note  ? ?Patient Care Team: ?Wenda Low, MD as PCP - General (Internal Medicine) ?Nobie Putnam, MD (Hematology and Oncology) ?Garlan Fair, MD as Attending Physician (Gastroenterology) ?Rana Snare, MD (Inactive) as Attending Physician (Urology) ? ?Date of Service:  08/07/2021 ? ?CHIEF COMPLAINT: f/u of essential thrombosis ? ?CURRENT THERAPY:  ?Hydrea, currently 500 mg MWF and 1000 mg other days since 05/2021 ? ?ASSESSMENT & PLAN:  ?Ricky Santos is a 88 y.o. male with  ? ?1. Essential Thrombocytosis. JAK2(+) ?-He was diagnosed in March 2014, initial platelet count in the 500k range, JAK2 mutation (+), he did not have bone marrow biopsy.  ?-We previously discussed that this is a myeloproliferative disorder, people usually do very well, and there is small percentage pts will develop myelofibrosis and AML late on.  ?-he initially started Hydrea in 08/2012, and his dose has been adjusted as needed. He tried anagrelide for one month, but this was not effective. He is currently on Hydrea 1000 mg MWF and 500 mg other days. He is tolerating well.  ?-Lab reviewed, hemoglobin 12.4, platelet 458.  Blood counts are controlled.  He notes he got a bit off track with his hydrea during his hospitalization, but he is back on schedule now.  We will continue current dose. ?-Repeat lab in 3 and 6 months ?  ?2. Anemia of iron deficiency  ?-Etiology of iron deficiency remains unclear. His EGD and colonoscopy with Dr. Michail Sermon in 03/2018 were benign  ?-Continue oral ferrous sulfate once daily  ?-anemia and iron panel resolved in 07/2019 ?-ferritin down to 21 in 03/2021, iron panel 05/08/21 showed elevated iron at 317. ?  ?3. MGUS ?-M spike 0.2 with IgG monoclonal protein with lambda light chain specificity on 12/06/17.  ?-Cr on 10/11/17 was elevated to 1.34, normal previously; has normalized on 02/14/18 ?-01/24/18 bone survey is negative for  lytic lesions  ?-Last M-Protein in 03/2021 was not observed. Light chain was stable. Will continue monitoring MM panel every 6 months.  ?  ?   ?Plan ?-Continue Hydrea, 500mg  daily on MWF and 1000mg  daily for the rest of the week ?-Lab in 3 and 6 months ?-F/u in 6 months  ? ? ?No problem-specific Assessment & Plan notes found for this encounter. ? ? ?INTERVAL HISTORY:  ?Ricky Santos is here for a follow up of essential thrombosis. He was last seen by me on 05/08/21. He presents to the clinic alone. ?He reports he had a recent bout of dizziness/vertigo in January. He was hospitalized and has been undergoing PT. He notes he does not find it very helpful. ?  ?All other systems were reviewed with the patient and are negative. ? ?MEDICAL HISTORY:  ?Past Medical History:  ?Diagnosis Date  ? Back pain   ? 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   ? Essential thrombocytosis ([place]) 08/26/2012  ? GERD (gastroesophageal reflux disease)   ? Hernia   ? Hyperlipidemia   ? Hypertension   ? Hypothyroid   ? Prostate cancer ([place])   ? ? ?SURGICAL HISTORY: ?Past Surgical History:  ?Procedure Laterality Date  ? BACK SURGERY    ? COLONOSCOPY  2003  ? COLONOSCOPY N/A 01/17/2013  ? Procedure: COLONOSCOPY;  Surgeon: Garlan Fair, MD;  Location: [facility];  Service: Endoscopy;  Laterality: N/A;  ? exp lap for trauma    ? HERNIA REPAIR    ?  INCISION AND DRAINAGE ABSCESS ANAL  08/01/05  ? PROSTATE SURGERY    ? SHOULDER SURGERY    ? rt  ? ? ?I have reviewed the social history and family history with the patient and they are unchanged from previous note. ? ?ALLERGIES:  has No Known Allergies. ? ?MEDICATIONS:  ?Current Outpatient Medications  ?Medication Sig Dispense Refill  ? ADVAIR DISKUS 250-50 MCG/ACT AEPB Inhale 1 puff into the lungs 2 (two) times daily.    ? aspirin 81 MG tablet Take 81 mg by mouth daily.      ? atorvastatin (LIPITOR) 40 MG tablet Take 40 mg by mouth daily.      ?  hydroxyurea (HYDREA) 500 MG capsule Take 1 tabs on Monday, Wednesday and Friday and 2 tabs daily for the rest of week. May take with food to minimize GI side effects. 150 capsule 1  ? levothyroxine (SYNTHROID, LEVOTHROID) 25 MCG tablet Take 25 mcg by mouth daily.      ? losartan-hydrochlorothiazide (HYZAAR) 50-12.5 MG tablet Take 1 tablet by mouth daily.    ? meclizine (ANTIVERT) 25 MG tablet Take 1 tablet (25 mg total) by mouth 3 (three) times daily as needed for dizziness. 30 tablet 0  ? MIRALAX 17 GM/SCOOP powder Take 17 g by mouth daily.    ? mirtazapine (REMERON) 15 MG tablet Take 15 mg by mouth at bedtime.     ? pantoprazole (PROTONIX) 40 MG tablet Take 40 mg by mouth daily.      ? tamsulosin (FLOMAX) 0.4 MG CAPS capsule Take 0.4 mg by mouth every morning.    ? TIADYLT ER 240 MG 24 hr capsule Take 240 mg by mouth daily.    ? ?No current facility-administered medications for this visit.  ? ? ?PHYSICAL EXAMINATION: ?ECOG PERFORMANCE STATUS: 1 - Symptomatic but completely ambulatory ? ?Vitals:  ? 08/07/21 0912  ?BP: (!) 142/68  ?Pulse: 73  ?Resp: 18  ?Temp: 98.2 ?F (36.8 ?C)  ?SpO2: 100%  ? ?Wt Readings from Last 3 Encounters:  ?08/07/21 158 lb 1.6 oz (71.7 kg)  ?06/04/21 160 lb 15 oz (73 kg)  ?05/08/21 160 lb 3.2 oz (72.7 kg)  ?  ? ?GENERAL:alert, no distress and comfortable ?SKIN: skin color normal, no rashes or significant lesions ?EYES: normal, Conjunctiva are pink and non-injected, sclera clear  ?NEURO: alert & oriented x 3 with fluent speech ? ?LABORATORY DATA:  ?I have reviewed the data as listed ?CBC Latest Ref Rng & Units 08/07/2021 06/04/2021 05/08/2021  ?WBC 4.0 - 10.5 K/uL 5.1 5.7 6.3  ?Hemoglobin 13.0 - 17.0 g/dL 12.4(L) 12.7(L) 12.6(L)  ?Hematocrit 39.0 - 52.0 % 36.6(L) 38.4(L) 39.3  ?Platelets 150 - 400 K/uL 458(H) 456(H) 573(H)  ? ? ? ?CMP Latest Ref Rng & Units 08/07/2021 06/04/2021 05/08/2021  ?Glucose 70 - 99 mg/dL 118(H) 123(H) 83  ?BUN 8 - 23 mg/dL $Remove'18 14 12  'YWKubQR$ ?Creatinine 0.61 - 1.24 mg/dL 1.40(H) 1.21  1.23  ?Sodium 135 - 145 mmol/L 140 137 141  ?Potassium 3.5 - 5.1 mmol/L 4.1 3.7 4.2  ?Chloride 98 - 111 mmol/L 107 107 109  ?CO2 22 - 32 mmol/L $RemoveB'25 23 22  'wEoMsTSA$ ?Calcium 8.9 - 10.3 mg/dL 9.5 9.5 9.0  ?Total Protein 6.5 - 8.1 g/dL 7.3 7.5 7.3  ?Total Bilirubin 0.3 - 1.2 mg/dL 0.5 0.8 0.4  ?Alkaline Phos 38 - 126 U/L 68 59 59  ?AST 15 - 41 U/L $Remo'18 20 19  'ZPPmv$ ?ALT 0 - 44 U/L $Remo'8 13 8  'UYPQX$ ? ? ? ? ?  RADIOGRAPHIC STUDIES: ?I have personally reviewed the radiological images as listed and agreed with the findings in the report. ?No results found.  ? ? ?No orders of the defined types were placed in this encounter. ? ?All questions were answered. The patient knows to call the clinic with any problems, questions or concerns. No barriers to learning was detected. ?The total time spent in the appointment was 20 minutes. ? ?  ? Truitt Merle, MD ?08/07/2021  ? ?I, Wilburn Mylar, am acting as scribe for Truitt Merle, MD.  ? ?I have reviewed the above documentation for accuracy and completeness, and I agree with the above. ?  ? ? ?

## 2021-08-11 ENCOUNTER — Telehealth: Payer: Self-pay | Admitting: *Deleted

## 2021-08-11 NOTE — Telephone Encounter (Signed)
Per Cira Rue, NP, called pt with message below. Pt verbalized understanding.  ?

## 2021-08-11 NOTE — Telephone Encounter (Signed)
-----   Message from Alla Feeling, NP sent at 08/09/2021 10:32 AM EST ----- ?Please review Scr is increased from baseline, increase hydration. No other change to plan reviewed by Dr. Burr Medico at his 3/9. ? ?Thanks, ?Regan Rakers, NP  ?

## 2021-08-14 DIAGNOSIS — H10413 Chronic giant papillary conjunctivitis, bilateral: Secondary | ICD-10-CM | POA: Diagnosis not present

## 2021-08-14 DIAGNOSIS — Z961 Presence of intraocular lens: Secondary | ICD-10-CM | POA: Diagnosis not present

## 2021-08-14 DIAGNOSIS — H40013 Open angle with borderline findings, low risk, bilateral: Secondary | ICD-10-CM | POA: Diagnosis not present

## 2021-08-14 DIAGNOSIS — H2512 Age-related nuclear cataract, left eye: Secondary | ICD-10-CM | POA: Diagnosis not present

## 2021-08-14 DIAGNOSIS — H43392 Other vitreous opacities, left eye: Secondary | ICD-10-CM | POA: Diagnosis not present

## 2021-10-09 ENCOUNTER — Ambulatory Visit: Payer: Medicare Other | Admitting: Hematology

## 2021-10-09 ENCOUNTER — Other Ambulatory Visit: Payer: Medicare Other

## 2021-10-09 DIAGNOSIS — N1831 Chronic kidney disease, stage 3a: Secondary | ICD-10-CM | POA: Diagnosis not present

## 2021-10-09 DIAGNOSIS — D472 Monoclonal gammopathy: Secondary | ICD-10-CM | POA: Diagnosis not present

## 2021-10-09 DIAGNOSIS — E041 Nontoxic single thyroid nodule: Secondary | ICD-10-CM | POA: Diagnosis not present

## 2021-10-09 DIAGNOSIS — G629 Polyneuropathy, unspecified: Secondary | ICD-10-CM | POA: Diagnosis not present

## 2021-10-09 DIAGNOSIS — Z1331 Encounter for screening for depression: Secondary | ICD-10-CM | POA: Diagnosis not present

## 2021-10-09 DIAGNOSIS — H8110 Benign paroxysmal vertigo, unspecified ear: Secondary | ICD-10-CM | POA: Diagnosis not present

## 2021-10-09 DIAGNOSIS — E782 Mixed hyperlipidemia: Secondary | ICD-10-CM | POA: Diagnosis not present

## 2021-10-09 DIAGNOSIS — E039 Hypothyroidism, unspecified: Secondary | ICD-10-CM | POA: Diagnosis not present

## 2021-10-09 DIAGNOSIS — I10 Essential (primary) hypertension: Secondary | ICD-10-CM | POA: Diagnosis not present

## 2021-10-09 DIAGNOSIS — J440 Chronic obstructive pulmonary disease with acute lower respiratory infection: Secondary | ICD-10-CM | POA: Diagnosis not present

## 2021-10-09 DIAGNOSIS — Z0000 Encounter for general adult medical examination without abnormal findings: Secondary | ICD-10-CM | POA: Diagnosis not present

## 2021-10-09 DIAGNOSIS — C61 Malignant neoplasm of prostate: Secondary | ICD-10-CM | POA: Diagnosis not present

## 2021-10-09 DIAGNOSIS — D473 Essential (hemorrhagic) thrombocythemia: Secondary | ICD-10-CM | POA: Diagnosis not present

## 2021-10-09 DIAGNOSIS — R7309 Other abnormal glucose: Secondary | ICD-10-CM | POA: Diagnosis not present

## 2021-10-23 DIAGNOSIS — C61 Malignant neoplasm of prostate: Secondary | ICD-10-CM | POA: Diagnosis not present

## 2021-11-06 ENCOUNTER — Other Ambulatory Visit: Payer: Self-pay

## 2021-11-06 ENCOUNTER — Inpatient Hospital Stay: Payer: Medicare (Managed Care) | Attending: Hematology

## 2021-11-06 DIAGNOSIS — D649 Anemia, unspecified: Secondary | ICD-10-CM

## 2021-11-06 DIAGNOSIS — D473 Essential (hemorrhagic) thrombocythemia: Secondary | ICD-10-CM | POA: Diagnosis not present

## 2021-11-06 DIAGNOSIS — D472 Monoclonal gammopathy: Secondary | ICD-10-CM

## 2021-11-06 LAB — CMP (CANCER CENTER ONLY)
ALT: 8 U/L (ref 0–44)
AST: 18 U/L (ref 15–41)
Albumin: 4.2 g/dL (ref 3.5–5.0)
Alkaline Phosphatase: 53 U/L (ref 38–126)
Anion gap: 5 (ref 5–15)
BUN: 20 mg/dL (ref 8–23)
CO2: 26 mmol/L (ref 22–32)
Calcium: 9.9 mg/dL (ref 8.9–10.3)
Chloride: 108 mmol/L (ref 98–111)
Creatinine: 1.36 mg/dL — ABNORMAL HIGH (ref 0.61–1.24)
GFR, Estimated: 51 mL/min — ABNORMAL LOW (ref >60)
Glucose, Bld: 63 mg/dL — ABNORMAL LOW (ref 70–99)
Potassium: 4 mmol/L (ref 3.5–5.1)
Sodium: 139 mmol/L (ref 135–145)
Total Bilirubin: 0.5 mg/dL (ref 0.3–1.2)
Total Protein: 7.2 g/dL (ref 6.5–8.1)

## 2021-11-06 LAB — CBC WITH DIFFERENTIAL (CANCER CENTER ONLY)
Abs Immature Granulocytes: 0.01 10*3/uL (ref 0.00–0.07)
Basophils Absolute: 0.1 10*3/uL (ref 0.0–0.1)
Basophils Relative: 2 %
Eosinophils Absolute: 0.2 10*3/uL (ref 0.0–0.5)
Eosinophils Relative: 4 %
HCT: 37.9 % — ABNORMAL LOW (ref 39.0–52.0)
Hemoglobin: 12.6 g/dL — ABNORMAL LOW (ref 13.0–17.0)
Immature Granulocytes: 0 %
Lymphocytes Relative: 26 %
Lymphs Abs: 1.4 10*3/uL (ref 0.7–4.0)
MCH: 32.6 pg (ref 26.0–34.0)
MCHC: 33.2 g/dL (ref 30.0–36.0)
MCV: 98.2 fL (ref 80.0–100.0)
Monocytes Absolute: 0.5 10*3/uL (ref 0.1–1.0)
Monocytes Relative: 9 %
Neutro Abs: 3.1 10*3/uL (ref 1.7–7.7)
Neutrophils Relative %: 59 %
Platelet Count: 478 10*3/uL — ABNORMAL HIGH (ref 150–400)
RBC: 3.86 MIL/uL — ABNORMAL LOW (ref 4.22–5.81)
RDW: 14.9 % (ref 11.5–15.5)
WBC Count: 5.2 10*3/uL (ref 4.0–10.5)
nRBC: 0 % (ref 0.0–0.2)

## 2021-11-06 LAB — FERRITIN: Ferritin: 76 ng/mL (ref 24–336)

## 2021-11-07 LAB — KAPPA/LAMBDA LIGHT CHAINS
Kappa free light chain: 45.8 mg/L — ABNORMAL HIGH (ref 3.3–19.4)
Kappa, lambda light chain ratio: 2.11 — ABNORMAL HIGH (ref 0.26–1.65)
Lambda free light chains: 21.7 mg/L (ref 5.7–26.3)

## 2021-11-10 ENCOUNTER — Telehealth: Payer: Self-pay

## 2021-11-10 LAB — PROTEIN ELECTROPHORESIS, SERUM, WITH REFLEX
A/G Ratio: 1.3 (ref 0.7–1.7)
Albumin ELP: 3.7 g/dL (ref 2.9–4.4)
Alpha-1-Globulin: 0.2 g/dL (ref 0.0–0.4)
Alpha-2-Globulin: 0.7 g/dL (ref 0.4–1.0)
Beta Globulin: 0.8 g/dL (ref 0.7–1.3)
Gamma Globulin: 1.1 g/dL (ref 0.4–1.8)
Globulin, Total: 2.8 g/dL (ref 2.2–3.9)
Total Protein ELP: 6.5 g/dL (ref 6.0–8.5)

## 2021-11-10 NOTE — Telephone Encounter (Signed)
LVM asking pt to give Dr. Ernestina Penna office a call indicating how he's taking his hydrea.  Informed pt that he should be taking '500mg'$  on MWF and '1000mg'$  on STThSat.  Instructed pt to call Dr. Ernestina Penna office stating how he's taking his hydra since his labs are higher than expectation range.

## 2021-11-11 ENCOUNTER — Telehealth: Payer: Self-pay

## 2021-11-11 NOTE — Telephone Encounter (Signed)
This nurse spoke with patient to verify how he is taking his Hydrea.  Patient stats that he is taking one tablet on MWF and two tablets every other day.  Patient stated that his labs may be out of range due to him missing a day of his medication the weekend prior to his labs being drawn.  This nurse advise that this information will be forwarded to the provider.  No further questions or concerns at this time.

## 2021-11-17 ENCOUNTER — Telehealth: Payer: Self-pay

## 2021-11-17 NOTE — Telephone Encounter (Signed)
This nurse reached out to patient and made him aware of lab results and recommendations from the provider mentioned below.  Patient acknowledged understanding and has no questions or concerns at this time.

## 2021-11-17 NOTE — Telephone Encounter (Signed)
-----   Message from Truitt Merle, MD sent at 11/16/2021 12:08 PM EDT ----- Please let pt know his lab results, plt is controlled, no other new concerns, thanks   Truitt Merle  11/16/2021

## 2021-12-12 NOTE — Progress Notes (Unsigned)
12/15/2021 5:01 PM   Ricky Santos 04-18-1934 [id number]  Referring provider: Wenda Low, MD [address] [place],  [place] [address]  No chief complaint on file.   HPI:  New pt - was seen at [facility] -   1) PCa - The patient was diagnosed with prostate cancer back in the spring of 2008. He had bilateral disease at that time with less than 5% of the tissue involved. His PSA was in the 10-15 range, which we thought was due to prostatic enlargement and smoldering prostatitis. PSA was 3.6 in 2010. His PSA came all the way down to 1.1 (2.2) without any active treatment and with 5ari. Repeat ultrasound was done in March of 2010. Prostate volume was approximately 70 grams.   He stopped 5ari in 2019 p Urolift. PSA rising to 3.98 09/21 after stopping finasteride. His DRE was normal 05/21 and 03/22 PSA 7. His 09/22 PSA 8.65 and 05/23 PSA up to 15. pMRI ordered and 5ari rx again 06/23.    2) BPH - since 2009 - was on 5ari for yrs. Prostate volume approximately 70 grams. He was on finasteride, tamsulosin and oxybutynin. AUASS = 17, mostly satisfied. PSA was 1.01 Jun 2017 with a normal DRE. Urolift was done 01/17/2018 with improved flow. He stopped all meds. AUAS was down to 8. He did restart oxybutynin due to some urgency.   AUASS = 21. No bothersome LUTS. PVR 35 ml. AUASS now 9 he reports only on tamsulosin. 5ari rx again 06/23.    Today, seen for the above. Txfr care from [facility]. He should be on tamsulosin and finasteride which he confirms he is taking.     PMH: Past Medical History:  Diagnosis Date   Back pain    Essential thrombocytosis ([place]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    Essential thrombocytosis ([place]) 08/26/2012   GERD (gastroesophageal reflux disease)    Hernia    Hyperlipidemia    Hypertension    Hypothyroid    Prostate cancer [facility])     Surgical History: Past Surgical History:  Procedure Laterality Date   BACK SURGERY      COLONOSCOPY  2003   COLONOSCOPY N/A 01/17/2013   Procedure: COLONOSCOPY;  Surgeon: Garlan Fair, MD;  [facility];  Service: Endoscopy;  Laterality: N/A;   exp lap for trauma     HERNIA REPAIR     INCISION AND DRAINAGE ABSCESS ANAL  08/01/05   PROSTATE SURGERY     SHOULDER SURGERY     rt    Home Medications:  Allergies as of 12/15/2021   No Known Allergies      Medication List        Accurate as of December 12, 2021  5:01 PM. If you have any questions, ask your nurse or doctor.          Advair Diskus 250-50 MCG/ACT Aepb Generic drug: fluticasone-salmeterol Inhale 1 puff into the lungs 2 (two) times daily.   aspirin 81 MG tablet Take 81 mg by mouth daily.   atorvastatin 40 MG tablet Commonly known as: LIPITOR Take 40 mg by mouth daily.   hydroxyurea 500 MG capsule Commonly known as: HYDREA Take 1 tabs on Monday, Wednesday and Friday and 2 tabs daily for the rest of week. May take with food to minimize GI side effects.   levothyroxine 25 MCG tablet Commonly known as: SYNTHROID Take 25 mcg by mouth daily.   losartan-hydrochlorothiazide 50-12.5 MG  tablet Commonly known as: HYZAAR Take 1 tablet by mouth daily.   meclizine 25 MG tablet Commonly known as: ANTIVERT Take 1 tablet (25 mg total) by mouth 3 (three) times daily as needed for dizziness.   MiraLax 17 GM/SCOOP powder Generic drug: polyethylene glycol powder Take 17 g by mouth daily.   mirtazapine 15 MG tablet Commonly known as: REMERON Take 15 mg by mouth at bedtime.   pantoprazole 40 MG tablet Commonly known as: PROTONIX Take 40 mg by mouth daily.   tamsulosin 0.4 MG Caps capsule Commonly known as: FLOMAX Take 0.4 mg by mouth every morning.   Tiadylt ER 240 MG 24 hr capsule Generic drug: diltiazem Take 240 mg by mouth daily.        Allergies: No Known Allergies  Family History: No family history on file.  Social History:  reports that he has been smoking cigarettes. He has  a 15.00 pack-year smoking history. He has never used smokeless tobacco. He reports that he does not drink alcohol and does not use drugs.   Physical Exam: There were no vitals taken for this visit.  Constitutional:  Alert and oriented, No acute distress. HEENT: [place] AT, moist mucus membranes.  Trachea midline, no masses. Cardiovascular: No clubbing, cyanosis, or edema. Respiratory: Normal respiratory effort, no increased work of breathing. GI: Abdomen is soft, nontender, nondistended, no abdominal masses GU: No CVA tenderness Skin: No rashes, bruises or suspicious lesions. Neurologic: Grossly intact, no focal deficits, moving all 4 extremities. Psychiatric: Normal mood and affect.  Laboratory Data: Lab Results  Component Value Date   WBC 5.2 11/06/2021   HGB 12.6 (L) 11/06/2021   HCT 37.9 (L) 11/06/2021   MCV 98.2 11/06/2021   PLT 478 (H) 11/06/2021    Lab Results  Component Value Date   CREATININE 1.36 (H) 11/06/2021    No results found for: "PSA"  No results found for: "TESTOSTERONE"  No results found for: "HGBA1C"  Urinalysis No results found for: "COLORURINE", "APPEARANCEUR", "LABSPEC", "PHURINE", "GLUCOSEU", "HGBUR", "BILIRUBINUR", "KETONESUR", "PROTEINUR", "UROBILINOGEN", "NITRITE", "LEUKOCYTESUR"  No results found for: "LABMICR", "WBCUA", "RBCUA", "LABEPIT", "MUCUS", "BACTERIA"  Pertinent Imaging: N/a [facility] notes and labs reviewed   Assessment & Plan:    Prostate cancer - not likely to have aggressive disease. PSA was sent (was on 5ari for 6 weeks) and consider imaging. He has difficulty getting to appts and requested a 6 mo f/u in stead of 3 months.   BPH - cont tams and finasteride. Finasteride refilled/added to med list.   No follow-ups on file.  Festus Aloe, MD  [facility]  [address] [place], [place] [address] [phone]

## 2021-12-15 ENCOUNTER — Ambulatory Visit (INDEPENDENT_AMBULATORY_CARE_PROVIDER_SITE_OTHER): Payer: Medicare (Managed Care) | Admitting: Urology

## 2021-12-15 VITALS — BP 168/91 | HR 81 | Ht 69.0 in | Wt 158.0 lb

## 2021-12-15 DIAGNOSIS — N401 Enlarged prostate with lower urinary tract symptoms: Secondary | ICD-10-CM | POA: Diagnosis not present

## 2021-12-15 DIAGNOSIS — Z8546 Personal history of malignant neoplasm of prostate: Secondary | ICD-10-CM | POA: Diagnosis not present

## 2021-12-15 DIAGNOSIS — C61 Malignant neoplasm of prostate: Secondary | ICD-10-CM | POA: Diagnosis not present

## 2021-12-15 DIAGNOSIS — N138 Other obstructive and reflux uropathy: Secondary | ICD-10-CM | POA: Diagnosis not present

## 2021-12-15 DIAGNOSIS — R350 Frequency of micturition: Secondary | ICD-10-CM | POA: Diagnosis not present

## 2021-12-15 LAB — MICROSCOPIC EXAMINATION
Bacteria, UA: NONE SEEN
Renal Epithel, UA: NONE SEEN /hpf
WBC, UA: NONE SEEN /hpf (ref 0–5)

## 2021-12-15 LAB — URINALYSIS, ROUTINE W REFLEX MICROSCOPIC
Bilirubin, UA: NEGATIVE
Glucose, UA: NEGATIVE
Ketones, UA: NEGATIVE
Leukocytes,UA: NEGATIVE
Nitrite, UA: NEGATIVE
Protein,UA: NEGATIVE
Specific Gravity, UA: 1.02 (ref 1.005–1.030)
Urobilinogen, Ur: 0.2 mg/dL (ref 0.2–1.0)
pH, UA: 5.5 (ref 5.0–7.5)

## 2021-12-15 MED ORDER — FINASTERIDE 5 MG PO TABS: 5.0000 mg | ORAL_TABLET | Freq: Every day | ORAL | 3 refills | Status: AC

## 2021-12-16 LAB — PSA: Prostate Specific Ag, Serum: 15.5 ng/mL — ABNORMAL HIGH (ref 0.0–4.0)

## 2021-12-17 NOTE — Addendum Note (Signed)
Addended by: Festus Aloe R on: 12/17/2021 11:49 AM   Modules accepted: Orders

## 2021-12-19 ENCOUNTER — Telehealth: Payer: Self-pay

## 2021-12-19 NOTE — Telephone Encounter (Signed)
-----   Message from Festus Aloe, MD sent at 12/17/2021 11:49 AM EDT ----- Please let Mr Komar know his PSA has risen to 15.5, so I ordered a PET scan to make sure there is no sign of prostate cancer spread. Please forward to front desk so that they can work on the PET scan. Thank you!   ----- Message ----- From: Audie Box, CMA Sent: 12/16/2021   8:05 AM EDT To: Festus Aloe, MD  Please review.

## 2021-12-19 NOTE — Telephone Encounter (Signed)
Patient informed or Dr. Lyndal Rainbow response to PSA results, he is aware imaging will be contacting him to schedule PET scan and message also forwarded to front desk per Dr. Junious Silk.

## 2021-12-30 ENCOUNTER — Ambulatory Visit (HOSPITAL_COMMUNITY)
Admission: RE | Admit: 2021-12-30 | Discharge: 2021-12-30 | Disposition: A | Discharge status: Home / Self Care | Payer: Medicare (Managed Care) | Source: Ambulatory Visit | Attending: Urology | Admitting: Urology

## 2021-12-30 DIAGNOSIS — C61 Malignant neoplasm of prostate: Secondary | ICD-10-CM | POA: Insufficient documentation

## 2021-12-30 MED ORDER — PIFLIFOLASTAT F 18 (PYLARIFY) INJECTION: 9.0000 | Freq: Once | INTRAVENOUS | Status: DC

## 2021-12-31 DIAGNOSIS — H6122 Impacted cerumen, left ear: Secondary | ICD-10-CM | POA: Diagnosis not present

## 2021-12-31 DIAGNOSIS — H6123 Impacted cerumen, bilateral: Secondary | ICD-10-CM | POA: Diagnosis not present

## 2021-12-31 DIAGNOSIS — H6121 Impacted cerumen, right ear: Secondary | ICD-10-CM | POA: Diagnosis not present

## 2022-01-02 ENCOUNTER — Telehealth: Payer: Self-pay

## 2022-01-02 NOTE — Telephone Encounter (Signed)
Left voicemail informing patient to call back for results, waiting for c/b

## 2022-01-02 NOTE — Telephone Encounter (Signed)
-----   Message from Festus Aloe, MD sent at 12/31/2021  1:37 PM EDT ----- Let Mr Jafri know his PET scan looks good. No sign of any significant prostate cancer or prostate cancer spread. We will continue to monitor his PSA.   ----- Message ----- From: Audie Box, CMA Sent: 12/30/2021   4:55 PM EDT To: Festus Aloe, MD  Please review.

## 2022-01-05 ENCOUNTER — Telehealth: Payer: Self-pay

## 2022-01-05 NOTE — Telephone Encounter (Signed)
Patient aware of results, pt mentioned he would prefer to Dr. Junious Silk at [facility] since it is closer to his home.  Gave him the number and instructed him to call and set apt up with them.

## 2022-01-05 NOTE — Telephone Encounter (Signed)
If pt calls back can you please send the call back to me.  I have been trying to reach him and every time I return his call I get his voicemail.  Thanks!

## 2022-01-05 NOTE — Telephone Encounter (Signed)
Patient called wanting to follow up on recent test results. I advised patient that you were in clinic and would contact him once you were out.

## 2022-02-04 ENCOUNTER — Other Ambulatory Visit: Payer: Self-pay

## 2022-02-04 DIAGNOSIS — D472 Monoclonal gammopathy: Secondary | ICD-10-CM

## 2022-02-04 DIAGNOSIS — D473 Essential (hemorrhagic) thrombocythemia: Secondary | ICD-10-CM

## 2022-02-04 DIAGNOSIS — D649 Anemia, unspecified: Secondary | ICD-10-CM

## 2022-02-05 ENCOUNTER — Inpatient Hospital Stay: Payer: Medicare (Managed Care) | Attending: Hematology

## 2022-02-05 ENCOUNTER — Inpatient Hospital Stay (HOSPITAL_BASED_OUTPATIENT_CLINIC_OR_DEPARTMENT_OTHER): Payer: Medicare (Managed Care) | Admitting: Hematology

## 2022-02-05 ENCOUNTER — Other Ambulatory Visit: Payer: Self-pay

## 2022-02-05 VITALS — BP 147/83 | HR 77 | Temp 98.4°F | Resp 18 | Ht 69.0 in | Wt 161.2 lb

## 2022-02-05 DIAGNOSIS — E039 Hypothyroidism, unspecified: Secondary | ICD-10-CM | POA: Insufficient documentation

## 2022-02-05 DIAGNOSIS — M549 Dorsalgia, unspecified: Secondary | ICD-10-CM | POA: Insufficient documentation

## 2022-02-05 DIAGNOSIS — Z7982 Long term (current) use of aspirin: Secondary | ICD-10-CM | POA: Diagnosis not present

## 2022-02-05 DIAGNOSIS — D649 Anemia, unspecified: Secondary | ICD-10-CM

## 2022-02-05 DIAGNOSIS — D509 Iron deficiency anemia, unspecified: Secondary | ICD-10-CM | POA: Insufficient documentation

## 2022-02-05 DIAGNOSIS — K219 Gastro-esophageal reflux disease without esophagitis: Secondary | ICD-10-CM | POA: Diagnosis not present

## 2022-02-05 DIAGNOSIS — D473 Essential (hemorrhagic) thrombocythemia: Secondary | ICD-10-CM

## 2022-02-05 DIAGNOSIS — D472 Monoclonal gammopathy: Secondary | ICD-10-CM | POA: Diagnosis not present

## 2022-02-05 DIAGNOSIS — Z79890 Hormone replacement therapy (postmenopausal): Secondary | ICD-10-CM | POA: Insufficient documentation

## 2022-02-05 DIAGNOSIS — Z79899 Other long term (current) drug therapy: Secondary | ICD-10-CM | POA: Insufficient documentation

## 2022-02-05 DIAGNOSIS — Z8546 Personal history of malignant neoplasm of prostate: Secondary | ICD-10-CM | POA: Insufficient documentation

## 2022-02-05 DIAGNOSIS — E785 Hyperlipidemia, unspecified: Secondary | ICD-10-CM | POA: Insufficient documentation

## 2022-02-05 DIAGNOSIS — I10 Essential (primary) hypertension: Secondary | ICD-10-CM | POA: Diagnosis not present

## 2022-02-05 LAB — CMP (CANCER CENTER ONLY)
ALT: 11 U/L (ref 0–44)
AST: 20 U/L (ref 15–41)
Albumin: 4.1 g/dL (ref 3.5–5.0)
Alkaline Phosphatase: 51 U/L (ref 38–126)
Anion gap: 7 (ref 5–15)
BUN: 19 mg/dL (ref 8–23)
CO2: 25 mmol/L (ref 22–32)
Calcium: 9.1 mg/dL (ref 8.9–10.3)
Chloride: 105 mmol/L (ref 98–111)
Creatinine: 1.51 mg/dL — ABNORMAL HIGH (ref 0.61–1.24)
GFR, Estimated: 45 mL/min — ABNORMAL LOW (ref >60)
Glucose, Bld: 105 mg/dL — ABNORMAL HIGH (ref 70–99)
Potassium: 3.9 mmol/L (ref 3.5–5.1)
Sodium: 137 mmol/L (ref 135–145)
Total Bilirubin: 0.6 mg/dL (ref 0.3–1.2)
Total Protein: 6.9 g/dL (ref 6.5–8.1)

## 2022-02-05 LAB — CBC WITH DIFFERENTIAL (CANCER CENTER ONLY)
Abs Immature Granulocytes: 0.03 10*3/uL (ref 0.00–0.07)
Basophils Absolute: 0.1 10*3/uL (ref 0.0–0.1)
Basophils Relative: 1 %
Eosinophils Absolute: 0.2 10*3/uL (ref 0.0–0.5)
Eosinophils Relative: 4 %
HCT: 35.7 % — ABNORMAL LOW (ref 39.0–52.0)
Hemoglobin: 12.1 g/dL — ABNORMAL LOW (ref 13.0–17.0)
Immature Granulocytes: 1 %
Lymphocytes Relative: 27 %
Lymphs Abs: 1.5 10*3/uL (ref 0.7–4.0)
MCH: 32.4 pg (ref 26.0–34.0)
MCHC: 33.9 g/dL (ref 30.0–36.0)
MCV: 95.7 fL (ref 80.0–100.0)
Monocytes Absolute: 0.5 10*3/uL (ref 0.1–1.0)
Monocytes Relative: 8 %
Neutro Abs: 3.4 10*3/uL (ref 1.7–7.7)
Neutrophils Relative %: 59 %
Platelet Count: 541 10*3/uL — ABNORMAL HIGH (ref 150–400)
RBC: 3.73 MIL/uL — ABNORMAL LOW (ref 4.22–5.81)
RDW: 13.7 % (ref 11.5–15.5)
WBC Count: 5.7 10*3/uL (ref 4.0–10.5)
nRBC: 0 % (ref 0.0–0.2)

## 2022-02-05 LAB — IRON AND IRON BINDING CAPACITY (CC-WL,HP ONLY)
Iron: 280 ug/dL — ABNORMAL HIGH (ref 45–182)
Saturation Ratios: 94 % — ABNORMAL HIGH (ref 17.9–39.5)
TIBC: 300 ug/dL (ref 250–450)
UIBC: 20 ug/dL — ABNORMAL LOW (ref 117–376)

## 2022-02-05 LAB — FERRITIN: Ferritin: 53 ng/mL (ref 24–336)

## 2022-02-05 MED ORDER — HYDROXYUREA 500 MG PO CAPS: ORAL_CAPSULE | ORAL | 1 refills | Status: DC

## 2022-02-05 NOTE — Progress Notes (Signed)
[place]   Telephone:([phone] [phone]   Clinic Follow up Note   Patient Care Team: Wenda Low, MD as PCP - General (Internal Medicine) Nobie Putnam, MD (Hematology and Oncology) Garlan Fair, MD as Attending Physician (Gastroenterology) Rana Snare, MD (Inactive) as Attending Physician (Urology)  Date of Service:  02/05/2022  CHIEF COMPLAINT: f/u of essential thrombosis  CURRENT THERAPY:  Hydrea, currently 500 mg MWF and 1000 mg other days  ASSESSMENT & PLAN:  Ricky Santos is a 88 y.o. male with   1. Essential Thrombocytosis. JAK2(+) -He was diagnosed in March 2014, initial platelet count in the 500k range, JAK2 mutation (+), he did not have bone marrow biopsy.  -We previously discussed that this is a myeloproliferative disorder, people usually do very well, and there is small percentage pts will develop myelofibrosis and AML late on.  -he initially started Hydrea in 08/2012, and his dose has been adjusted as needed. He tried anagrelide for one month, but this was not effective. He is currently on Hydrea 1000 mg MWF and 500 mg other days. He is tolerating well.  -Lab reviewed, hemoglobin 12.1, platelet 541k.  He tells me today he is still taking $RemoveBefo'1000mg'HOChVWcasAt$  only 3 days a week, instead of on 4 days of the week. We reviewed this today, and he will adjust this. -Repeat lab in 3 and 6 months   2. Anemia of iron deficiency  -Etiology of iron deficiency remains unclear. His EGD and colonoscopy with Dr. Michail Sermon in 03/2018 were benign  -Continue oral ferrous sulfate once daily  -anemia and iron panel resolved in 07/2019 -ferritin and iron panel today are pending (02/05/22)   3. MGUS -M spike 0.2 with IgG monoclonal protein with lambda light chain specificity on 12/06/17.  -Cr on 10/11/17 was elevated to 1.34, normal previously; has normalized on 02/14/18 -01/24/18 bone survey is negative for lytic lesions  -Last M-Protein in 03/2021 was not observed. Light  chain was stable. Will continue monitoring MM panel every 6 months.       Plan -Continue Hydrea, change to $RemoveB'500mg'DNoQtRBu$  daily on MWF and $Remov'1000mg'lsnOXM$  daily for the rest of the week -Lab in 3 and 6 months -F/u in 6 months    No problem-specific Assessment & Plan notes found for this encounter.   INTERVAL HISTORY:  Ricky Santos is here for a follow up of ET. He was last seen by me on 08/07/21. He presents to the clinic alone. He reports he is doing well overall.   All other systems were reviewed with the patient and are negative.  MEDICAL HISTORY:  Past Medical History:  Diagnosis Date   Back pain    Essential thrombocytosis ([place]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    Essential thrombocytosis ([place]) 08/26/2012   GERD (gastroesophageal reflux disease)    Hernia    Hyperlipidemia    Hypertension    Hypothyroid    Prostate cancer [facility])     SURGICAL HISTORY: Past Surgical History:  Procedure Laterality Date   BACK SURGERY     COLONOSCOPY  2003   COLONOSCOPY N/A 01/17/2013   Procedure: COLONOSCOPY;  Surgeon: Garlan Fair, MD;  [facility];  Service: Endoscopy;  Laterality: N/A;   exp lap for trauma     HERNIA REPAIR     INCISION AND DRAINAGE ABSCESS ANAL  08/01/05   PROSTATE SURGERY     SHOULDER SURGERY     rt  I have reviewed the social history and family history with the patient and they are unchanged from previous note.  ALLERGIES:  has No Known Allergies.  MEDICATIONS:  Current Outpatient Medications  Medication Sig Dispense Refill   ADVAIR DISKUS 250-50 MCG/ACT AEPB Inhale 1 puff into the lungs 2 (two) times daily.     aspirin 81 MG tablet Take 81 mg by mouth daily.       atorvastatin (LIPITOR) 40 MG tablet Take 40 mg by mouth daily.       finasteride (PROSCAR) 5 MG tablet Take 1 tablet (5 mg total) by mouth daily. 90 tablet 3   hydroxyurea (HYDREA) 500 MG capsule Take 1 tabs on Monday, Wednesday and Friday and 2 tabs daily for the  rest of week. May take with food to minimize GI side effects. 150 capsule 1   levothyroxine (SYNTHROID, LEVOTHROID) 25 MCG tablet Take 25 mcg by mouth daily.       losartan-hydrochlorothiazide (HYZAAR) 50-12.5 MG tablet Take 1 tablet by mouth daily.     meclizine (ANTIVERT) 25 MG tablet Take 1 tablet (25 mg total) by mouth 3 (three) times daily as needed for dizziness. 30 tablet 0   MIRALAX 17 GM/SCOOP powder Take 17 g by mouth daily.     mirtazapine (REMERON) 15 MG tablet Take 15 mg by mouth at bedtime.      pantoprazole (PROTONIX) 40 MG tablet Take 40 mg by mouth daily.       tamsulosin (FLOMAX) 0.4 MG CAPS capsule Take 0.4 mg by mouth every morning.     TIADYLT ER 240 MG 24 hr capsule Take 240 mg by mouth daily.     No current facility-administered medications for this visit.    PHYSICAL EXAMINATION: ECOG PERFORMANCE STATUS: 1 - Symptomatic but completely ambulatory  Vitals:   02/05/22 1133  BP: (!) 147/83  Pulse: 77  Resp: 18  Temp: 98.4 F (36.9 C)  SpO2: 100%   Wt Readings from Last 3 Encounters:  02/05/22 161 lb 3.2 oz (73.1 kg)  12/15/21 158 lb (71.7 kg)  08/07/21 158 lb 1.6 oz (71.7 kg)     GENERAL:alert, no distress and comfortable SKIN: skin color normal, no rashes or significant lesions EYES: normal, Conjunctiva are pink and non-injected, sclera clear  NEURO: alert & oriented x 3 with fluent speech  LABORATORY DATA:  I have reviewed the data as listed    Latest Ref Rng & Units 02/05/2022   11:04 AM 11/06/2021   10:42 AM 08/07/2021    9:01 AM  CBC  WBC 4.0 - 10.5 K/uL 5.7  5.2  5.1   Hemoglobin 13.0 - 17.0 g/dL 12.1  12.6  12.4   Hematocrit 39.0 - 52.0 % 35.7  37.9  36.6   Platelets 150 - 400 K/uL 541  478  458         Latest Ref Rng & Units 02/05/2022   11:04 AM 11/06/2021   10:42 AM 08/07/2021    9:01 AM  CMP  Glucose 70 - 99 mg/dL 105  63  118   BUN 8 - 23 mg/dL $Remove'19  20  18   'IldGhes$ Creatinine 0.61 - 1.24 mg/dL 1.51  1.36  1.40   Sodium 135 - 145 mmol/L 137  139   140   Potassium 3.5 - 5.1 mmol/L 3.9  4.0  4.1   Chloride 98 - 111 mmol/L 105  108  107   CO2 22 - 32 mmol/L 25  26  25  Calcium 8.9 - 10.3 mg/dL 9.1  9.9  9.5   Total Protein 6.5 - 8.1 g/dL 6.9  7.2  7.3   Total Bilirubin 0.3 - 1.2 mg/dL 0.6  0.5  0.5   Alkaline Phos 38 - 126 U/L 51  53  68   AST 15 - 41 U/L $Remo'20  18  18   'iMNoX$ ALT 0 - 44 U/L $Remo'11  8  8       'VdhvU$ RADIOGRAPHIC STUDIES: I have personally reviewed the radiological images as listed and agreed with the findings in the report. No results found.    No orders of the defined types were placed in this encounter.  All questions were answered. The patient knows to call the clinic with any problems, questions or concerns. No barriers to learning was detected. The total time spent in the appointment was 20 minutes.     Truitt Merle, MD 02/05/2022   I, Wilburn Mylar, am acting as scribe for Truitt Merle, MD.   I have reviewed the above documentation for accuracy and completeness, and I agree with the above.

## 2022-02-06 LAB — KAPPA/LAMBDA LIGHT CHAINS
Kappa free light chain: 42.1 mg/L — ABNORMAL HIGH (ref 3.3–19.4)
Kappa, lambda light chain ratio: 1.79 — ABNORMAL HIGH (ref 0.26–1.65)
Lambda free light chains: 23.5 mg/L (ref 5.7–26.3)

## 2022-02-09 LAB — PROTEIN ELECTROPHORESIS, SERUM, WITH REFLEX
A/G Ratio: 1.4 (ref 0.7–1.7)
Albumin ELP: 3.7 g/dL (ref 2.9–4.4)
Alpha-1-Globulin: 0.2 g/dL (ref 0.0–0.4)
Alpha-2-Globulin: 0.7 g/dL (ref 0.4–1.0)
Beta Globulin: 0.8 g/dL (ref 0.7–1.3)
Gamma Globulin: 1 g/dL (ref 0.4–1.8)
Globulin, Total: 2.7 g/dL (ref 2.2–3.9)
Total Protein ELP: 6.4 g/dL (ref 6.0–8.5)

## 2022-03-05 ENCOUNTER — Telehealth: Payer: Self-pay

## 2022-03-05 NOTE — Patient Outreach (Signed)
  Care Coordination   03/05/2022 Name: Ricky Santos MRN: [id number] DOB: 05-27-1934   Care Coordination Outreach Attempts:  An unsuccessful telephone outreach was attempted today to offer the patient information about available care coordination services as a benefit of their health plan.   Follow Up Plan:  Additional outreach attempts will be made to offer the patient care coordination information and services.   Encounter Outcome:  No Answer  Care Coordination Interventions Activated:  No   Care Coordination Interventions:  No, not indicated    [facility] [phone]

## 2022-03-13 ENCOUNTER — Telehealth: Payer: Self-pay

## 2022-03-13 NOTE — Patient Outreach (Signed)
  Care Coordination   Initial Visit Note   03/13/2022 Name: TARYLL REICHENBERGER MRN: [id number] DOB: [birth year]  [name] is a 88 y.o. year old male who sees Wenda Low, MD for primary care. I spoke with  [name] by phone today.  What matters to the patients health and wellness today?  No Concerns Expressed/Patient Scheduled    Goals Addressed   None     SDOH assessments and interventions completed:  No     Care Coordination Interventions Activated:  No  Care Coordination Interventions:  No, not indicated   Follow up plan:  Will follow up next week.    Encounter Outcome:  Pt. Scheduled   [facility] [phone]

## 2022-03-18 ENCOUNTER — Telehealth: Payer: Self-pay

## 2022-03-18 NOTE — Patient Outreach (Signed)
Error Please Disregard

## 2022-03-18 NOTE — Patient Outreach (Signed)
  Care Coordination   03/18/2022 Name: Ricky Santos MRN: [id number] DOB: 1933-12-01   Care Coordination Outreach Attempts:  An unsuccessful telephone outreach was attempted for a scheduled appointment today.  Follow Up Plan:  Additional outreach attempts will be made to offer the patient care coordination information and services.   Encounter Outcome:  No Answer  Care Coordination Interventions Activated:  No   Care Coordination Interventions:  No, not indicated    [facility] [phone]

## 2022-04-14 ENCOUNTER — Telehealth: Payer: Self-pay

## 2022-04-14 DIAGNOSIS — D473 Essential (hemorrhagic) thrombocythemia: Secondary | ICD-10-CM | POA: Diagnosis not present

## 2022-04-14 DIAGNOSIS — E039 Hypothyroidism, unspecified: Secondary | ICD-10-CM | POA: Diagnosis not present

## 2022-04-14 DIAGNOSIS — C61 Malignant neoplasm of prostate: Secondary | ICD-10-CM | POA: Diagnosis not present

## 2022-04-14 DIAGNOSIS — E782 Mixed hyperlipidemia: Secondary | ICD-10-CM | POA: Diagnosis not present

## 2022-04-14 DIAGNOSIS — D472 Monoclonal gammopathy: Secondary | ICD-10-CM | POA: Diagnosis not present

## 2022-04-14 DIAGNOSIS — N1831 Chronic kidney disease, stage 3a: Secondary | ICD-10-CM | POA: Diagnosis not present

## 2022-04-14 DIAGNOSIS — K219 Gastro-esophageal reflux disease without esophagitis: Secondary | ICD-10-CM | POA: Diagnosis not present

## 2022-04-14 DIAGNOSIS — I10 Essential (primary) hypertension: Secondary | ICD-10-CM | POA: Diagnosis not present

## 2022-04-14 DIAGNOSIS — J440 Chronic obstructive pulmonary disease with acute lower respiratory infection: Secondary | ICD-10-CM | POA: Diagnosis not present

## 2022-04-14 NOTE — Telephone Encounter (Signed)
Patient called stated he received some paperwork he had been waiting for and he was reading over it and it had some mistakes on it. Called him back and he stated their were dates on the paper work that he had been hospitalized  from 9p-1 to 9-30 on the itemized statement. Informed him he needed to talk with billing and I transferred him to that department.

## 2022-05-07 ENCOUNTER — Inpatient Hospital Stay: Payer: Medicare (Managed Care) | Attending: Hematology

## 2022-05-07 ENCOUNTER — Other Ambulatory Visit: Payer: Self-pay

## 2022-05-07 DIAGNOSIS — D649 Anemia, unspecified: Secondary | ICD-10-CM

## 2022-05-07 DIAGNOSIS — D473 Essential (hemorrhagic) thrombocythemia: Secondary | ICD-10-CM | POA: Insufficient documentation

## 2022-05-07 DIAGNOSIS — D472 Monoclonal gammopathy: Secondary | ICD-10-CM

## 2022-05-07 LAB — CMP (CANCER CENTER ONLY)
ALT: 9 U/L (ref 0–44)
AST: 18 U/L (ref 15–41)
Albumin: 4.2 g/dL (ref 3.5–5.0)
Alkaline Phosphatase: 60 U/L (ref 38–126)
Anion gap: 8 (ref 5–15)
BUN: 17 mg/dL (ref 8–23)
CO2: 23 mmol/L (ref 22–32)
Calcium: 9.7 mg/dL (ref 8.9–10.3)
Chloride: 104 mmol/L (ref 98–111)
Creatinine: 1.3 mg/dL — ABNORMAL HIGH (ref 0.61–1.24)
GFR, Estimated: 54 mL/min — ABNORMAL LOW (ref >60)
Glucose, Bld: 74 mg/dL (ref 70–99)
Potassium: 4.4 mmol/L (ref 3.5–5.1)
Sodium: 135 mmol/L (ref 135–145)
Total Bilirubin: 0.4 mg/dL (ref 0.3–1.2)
Total Protein: 7.2 g/dL (ref 6.5–8.1)

## 2022-05-07 LAB — CBC WITH DIFFERENTIAL (CANCER CENTER ONLY)
Abs Immature Granulocytes: 0.03 10*3/uL (ref 0.00–0.07)
Basophils Absolute: 0.1 10*3/uL (ref 0.0–0.1)
Basophils Relative: 2 %
Eosinophils Absolute: 0.2 10*3/uL (ref 0.0–0.5)
Eosinophils Relative: 4 %
HCT: 38.2 % — ABNORMAL LOW (ref 39.0–52.0)
Hemoglobin: 12.6 g/dL — ABNORMAL LOW (ref 13.0–17.0)
Immature Granulocytes: 1 %
Lymphocytes Relative: 26 %
Lymphs Abs: 1.6 10*3/uL (ref 0.7–4.0)
MCH: 32.5 pg (ref 26.0–34.0)
MCHC: 33 g/dL (ref 30.0–36.0)
MCV: 98.5 fL (ref 80.0–100.0)
Monocytes Absolute: 0.6 10*3/uL (ref 0.1–1.0)
Monocytes Relative: 9 %
Neutro Abs: 3.6 10*3/uL (ref 1.7–7.7)
Neutrophils Relative %: 58 %
Platelet Count: 526 10*3/uL — ABNORMAL HIGH (ref 150–400)
RBC: 3.88 MIL/uL — ABNORMAL LOW (ref 4.22–5.81)
RDW: 14.4 % (ref 11.5–15.5)
WBC Count: 6.1 10*3/uL (ref 4.0–10.5)
nRBC: 0 % (ref 0.0–0.2)

## 2022-05-07 LAB — IRON AND IRON BINDING CAPACITY (CC-WL,HP ONLY)
Iron: 73 ug/dL (ref 45–182)
Saturation Ratios: 21 % (ref 17.9–39.5)
TIBC: 342 ug/dL (ref 250–450)
UIBC: 269 ug/dL (ref 117–376)

## 2022-05-07 LAB — FERRITIN: Ferritin: 58 ng/mL (ref 24–336)

## 2022-05-08 IMAGING — MR MR HEAD W/O CM
10 series · 48 of 48 positions shown · non-contrast
Comparison: Prior CT from earlier the same day.

CLINICAL DATA: Initial evaluation for neuro deficit, stroke
suspected.

EXAM:
MRI HEAD WITHOUT CONTRAST
TECHNIQUE: Multiplanar, multiecho pulse sequences of the brain and surrounding
structures were obtained without intravenous contrast.

[Series 5: DWI · axial · 3.0mm · 1.36mm/px · z∈[-28,+113]mm · 8 of 96 slices shown (1 of 2)]
[im 1/96]
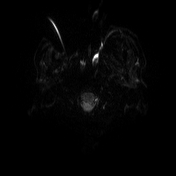
[im 14/96]
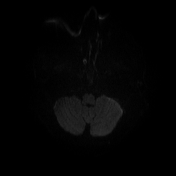
[im 28/96]
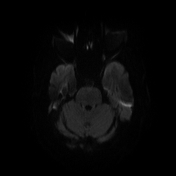
[im 41/96]
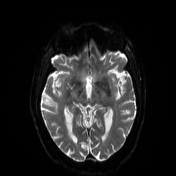
[im 55/96]
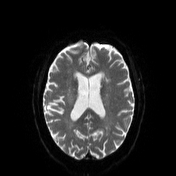
[im 68/96]
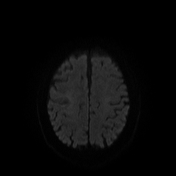
[im 82/96]
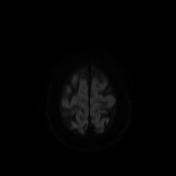
[im 96/96]
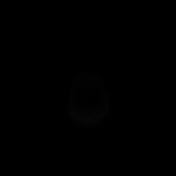

[Series 6: DWI · axial · 3.0mm · 1.36mm/px · z∈[-28,+113]mm · 4 of 48 slices shown (2 of 2)]
[im 1/48]
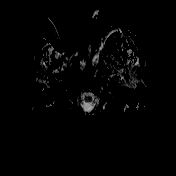
[im 16/48]
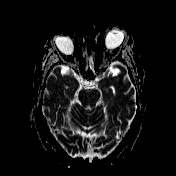
[im 32/48]
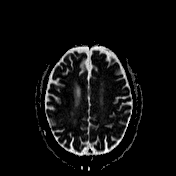
[im 48/48]
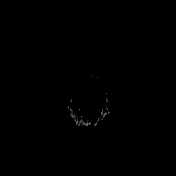

[Series 7: T1 · sagittal · 5.0mm · 0.75mm/px · 2 of 24 slices shown (1 of 2)]
[im 1/24]
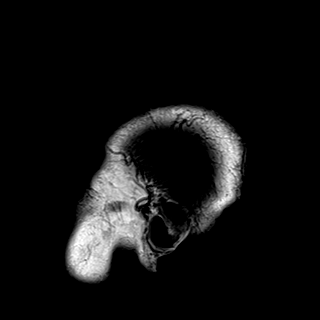
[im 24/24]
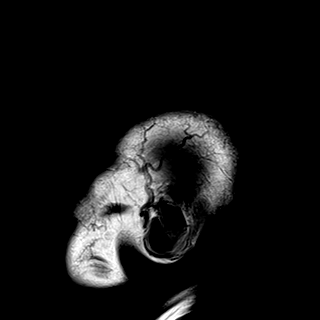

[Series 8: T2 · axial · 5.0mm · 0.62mm/px · z∈[-24,+111]mm · 2 of 22 slices shown (1 of 2)]
[im 1/22]
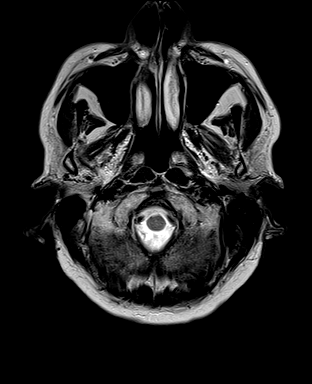
[im 22/22]
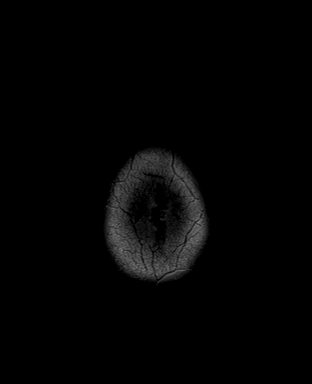

[Series 9: swi_images · axial · 3.0mm · 0.75mm/px · z∈[-62,+149]mm · 6 of 72 slices shown]
[im 1/72]
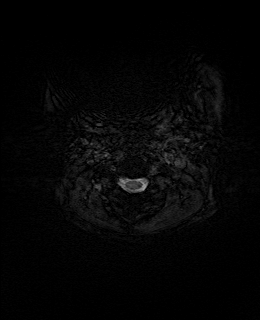
[im 15/72]
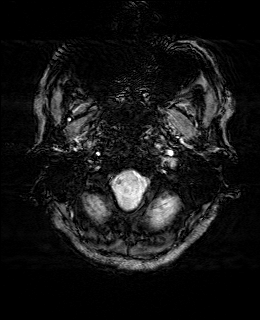
[im 29/72]
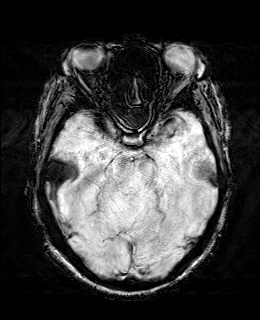
[im 43/72]
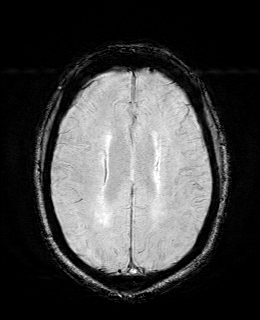
[im 57/72]
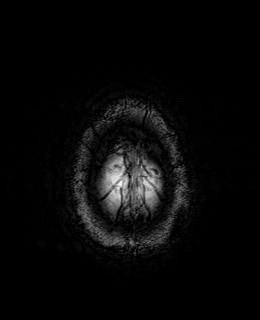
[im 72/72]
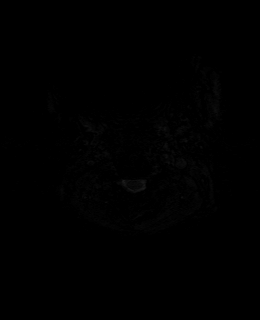

[Series 11: FLAIR · axial · 3.0mm · 0.75mm/px · z∈[-32,+119]mm · 4 of 52 slices shown]
[im 1/52]
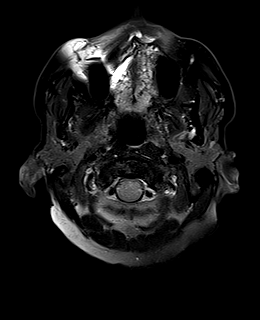
[im 18/52]
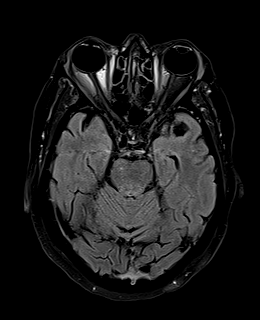
[im 35/52]
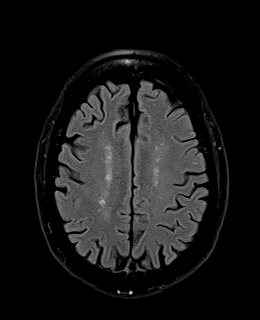
[im 52/52]
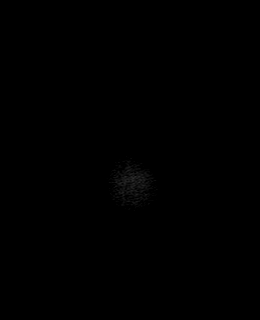

[Series 12: T1 · axial · 1.0mm · 0.94mm/px · z∈[-33,+109]mm · 12 of 144 slices shown (2 of 2)]
[im 1/144]
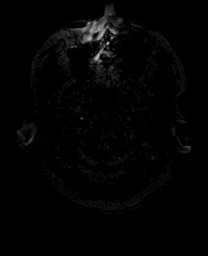
[im 14/144]
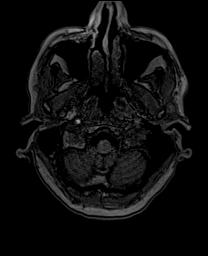
[im 27/144]
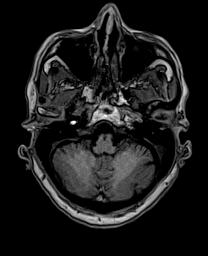
[im 40/144]
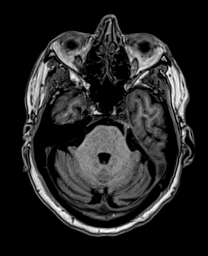
[im 53/144]
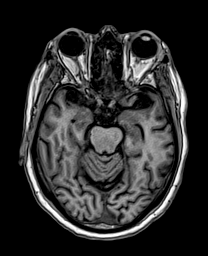
[im 66/144]
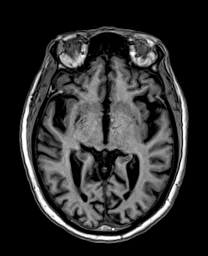
[im 79/144]
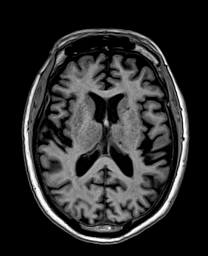
[im 92/144]
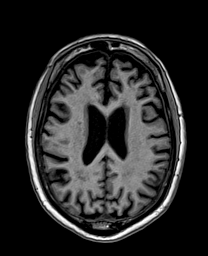
[im 105/144]
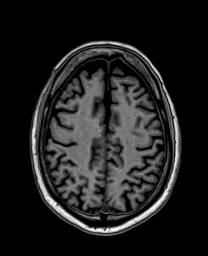
[im 118/144]
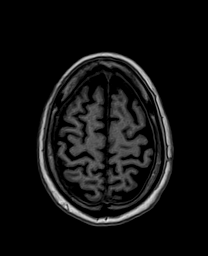
[im 131/144]
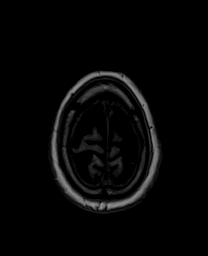
[im 144/144]
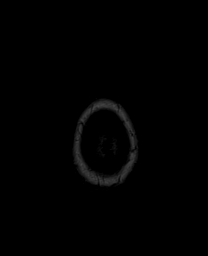

[Series 13: cor dwi_tracew · coronal · 5.0mm · 1.53mm/px · 5 of 56 slices shown]
[im 1/56]
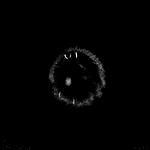
[im 14/56]
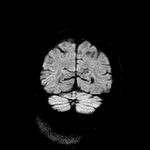
[im 28/56]
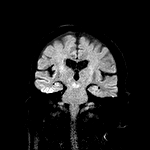
[im 42/56]
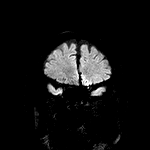
[im 56/56]
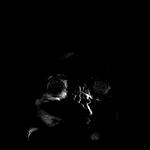

[Series 14: cor dwi_adc · coronal · 5.0mm · 1.53mm/px · 2 of 28 slices shown]
[im 1/28]
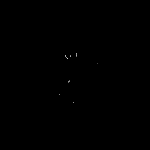
[im 28/28]
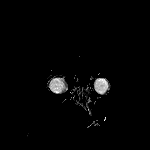

[Series 15: T2 · coronal · 5.0mm · 0.69mm/px · 3 of 35 slices shown (2 of 2)]
[im 1/35]
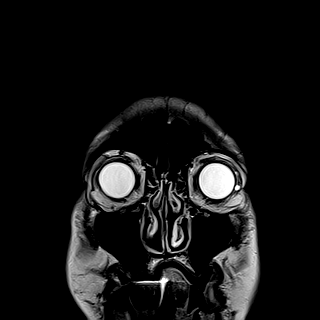
[im 18/35]
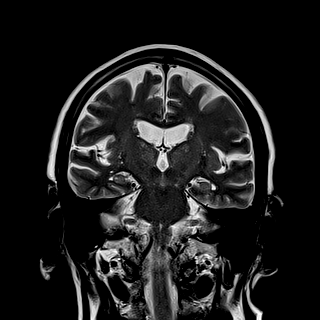
[im 35/35]
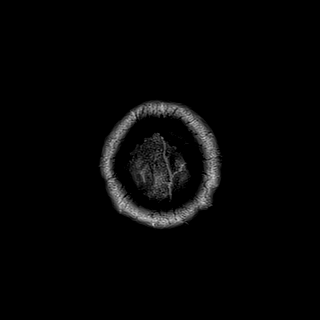

[48 of 48 positions shown; findings below may reference images not displayed]

FINDINGS: Brain: Cerebral volume within normal limits for age. Scattered
patchy T2/FLAIR hyperintensity involving the periventricular and
deep white matter both cerebral hemispheres, most consistent with
chronic small vessel ischemic disease, mild in nature. Small remote
lacunar infarcts present at the left basal ganglia and left
thalamus.

No evidence for acute or subacute ischemia. Gray-white matter
differentiation maintained. No encephalomalacia to suggest chronic
cortical infarction. No acute or chronic intracranial hemorrhage.

No mass lesion, midline shift or mass effect. No hydrocephalus or
extra axial fluid collection. Pituitary gland suprasellar region
normal. Midline structures intact.

Vascular: Major intracranial vascular flow voids are maintained.

Skull and upper cervical spine: Craniocervical junction within
normal limits. Bone marrow signal intensity normal. No scalp soft
tissue abnormality.

Sinuses/Orbits: Patient status post ocular lens replacement on the
right. Globes and orbital soft tissues demonstrate no acute finding.
Paranasal sinuses are largely clear. No mastoid effusion. Inner ear
structures grossly normal.

Other: None.
IMPRESSION: 1. No acute intracranial abnormality.
2. Mild chronic microvascular ischemic disease for age with small
remote lacunar infarcts involving the left basal ganglia and left
thalamus.

## 2022-05-11 LAB — PROTEIN ELECTROPHORESIS, SERUM, WITH REFLEX
A/G Ratio: 1.3 (ref 0.7–1.7)
Albumin ELP: 3.9 g/dL (ref 2.9–4.4)
Alpha-1-Globulin: 0.2 g/dL (ref 0.0–0.4)
Alpha-2-Globulin: 0.7 g/dL (ref 0.4–1.0)
Beta Globulin: 0.8 g/dL (ref 0.7–1.3)
Gamma Globulin: 1.1 g/dL (ref 0.4–1.8)
Globulin, Total: 2.9 g/dL (ref 2.2–3.9)
Total Protein ELP: 6.8 g/dL (ref 6.0–8.5)

## 2022-05-11 LAB — KAPPA/LAMBDA LIGHT CHAINS
Kappa free light chain: 49.9 mg/L — ABNORMAL HIGH (ref 3.3–19.4)
Kappa, lambda light chain ratio: 1.86 — ABNORMAL HIGH (ref 0.26–1.65)
Lambda free light chains: 26.9 mg/L — ABNORMAL HIGH (ref 5.7–26.3)

## 2022-05-12 ENCOUNTER — Telehealth: Payer: Self-pay

## 2022-05-12 NOTE — Telephone Encounter (Addendum)
Made patient aware of below results patient voiced understanding    ----- Message from Truitt Merle, MD sent at 05/12/2022 12:57 PM EST ----- Please let pt know his lab overall stable, continue current dose hydrea, thanks   Truitt Merle

## 2022-06-15 ENCOUNTER — Ambulatory Visit: Payer: Medicare (Managed Care) | Admitting: Urology

## 2022-06-17 DIAGNOSIS — C61 Malignant neoplasm of prostate: Secondary | ICD-10-CM | POA: Diagnosis not present

## 2022-06-17 DIAGNOSIS — R351 Nocturia: Secondary | ICD-10-CM | POA: Diagnosis not present

## 2022-08-05 DIAGNOSIS — E782 Mixed hyperlipidemia: Secondary | ICD-10-CM | POA: Diagnosis not present

## 2022-08-05 DIAGNOSIS — D472 Monoclonal gammopathy: Secondary | ICD-10-CM | POA: Diagnosis not present

## 2022-08-05 DIAGNOSIS — N1831 Chronic kidney disease, stage 3a: Secondary | ICD-10-CM | POA: Diagnosis not present

## 2022-08-05 DIAGNOSIS — I10 Essential (primary) hypertension: Secondary | ICD-10-CM | POA: Diagnosis not present

## 2022-08-05 DIAGNOSIS — J440 Chronic obstructive pulmonary disease with acute lower respiratory infection: Secondary | ICD-10-CM | POA: Diagnosis not present

## 2022-08-05 DIAGNOSIS — C61 Malignant neoplasm of prostate: Secondary | ICD-10-CM | POA: Diagnosis not present

## 2022-08-05 DIAGNOSIS — H8110 Benign paroxysmal vertigo, unspecified ear: Secondary | ICD-10-CM | POA: Diagnosis not present

## 2022-08-05 DIAGNOSIS — D473 Essential (hemorrhagic) thrombocythemia: Secondary | ICD-10-CM | POA: Diagnosis not present

## 2022-08-05 DIAGNOSIS — G629 Polyneuropathy, unspecified: Secondary | ICD-10-CM | POA: Diagnosis not present

## 2022-08-06 ENCOUNTER — Inpatient Hospital Stay: Payer: Medicare HMO | Attending: Hematology | Admitting: Hematology

## 2022-08-06 ENCOUNTER — Other Ambulatory Visit: Payer: Self-pay

## 2022-08-06 ENCOUNTER — Inpatient Hospital Stay: Payer: Medicare HMO

## 2022-08-06 ENCOUNTER — Encounter: Payer: Self-pay | Admitting: Hematology

## 2022-08-06 VITALS — BP 146/83 | HR 73 | Temp 98.3°F | Resp 16 | Ht 69.0 in | Wt 162.3 lb

## 2022-08-06 DIAGNOSIS — Z7982 Long term (current) use of aspirin: Secondary | ICD-10-CM | POA: Diagnosis not present

## 2022-08-06 DIAGNOSIS — K219 Gastro-esophageal reflux disease without esophagitis: Secondary | ICD-10-CM | POA: Insufficient documentation

## 2022-08-06 DIAGNOSIS — D472 Monoclonal gammopathy: Secondary | ICD-10-CM

## 2022-08-06 DIAGNOSIS — R42 Dizziness and giddiness: Secondary | ICD-10-CM | POA: Diagnosis not present

## 2022-08-06 DIAGNOSIS — D649 Anemia, unspecified: Secondary | ICD-10-CM

## 2022-08-06 DIAGNOSIS — E039 Hypothyroidism, unspecified: Secondary | ICD-10-CM | POA: Insufficient documentation

## 2022-08-06 DIAGNOSIS — I10 Essential (primary) hypertension: Secondary | ICD-10-CM | POA: Diagnosis not present

## 2022-08-06 DIAGNOSIS — D509 Iron deficiency anemia, unspecified: Secondary | ICD-10-CM | POA: Diagnosis not present

## 2022-08-06 DIAGNOSIS — E785 Hyperlipidemia, unspecified: Secondary | ICD-10-CM | POA: Diagnosis not present

## 2022-08-06 DIAGNOSIS — Z79890 Hormone replacement therapy (postmenopausal): Secondary | ICD-10-CM | POA: Diagnosis not present

## 2022-08-06 DIAGNOSIS — Z8546 Personal history of malignant neoplasm of prostate: Secondary | ICD-10-CM | POA: Diagnosis not present

## 2022-08-06 DIAGNOSIS — Z79899 Other long term (current) drug therapy: Secondary | ICD-10-CM | POA: Insufficient documentation

## 2022-08-06 DIAGNOSIS — D473 Essential (hemorrhagic) thrombocythemia: Secondary | ICD-10-CM

## 2022-08-06 LAB — IRON AND IRON BINDING CAPACITY (CC-WL,HP ONLY)
Iron: 135 ug/dL (ref 45–182)
Saturation Ratios: 37 % (ref 17.9–39.5)
TIBC: 364 ug/dL (ref 250–450)
UIBC: 229 ug/dL (ref 117–376)

## 2022-08-06 LAB — CMP (CANCER CENTER ONLY)
ALT: 7 U/L (ref 0–44)
AST: 20 U/L (ref 15–41)
Albumin: 4.3 g/dL (ref 3.5–5.0)
Alkaline Phosphatase: 54 U/L (ref 38–126)
Anion gap: 9 (ref 5–15)
BUN: 24 mg/dL — ABNORMAL HIGH (ref 8–23)
CO2: 21 mmol/L — ABNORMAL LOW (ref 22–32)
Calcium: 9.4 mg/dL (ref 8.9–10.3)
Chloride: 106 mmol/L (ref 98–111)
Creatinine: 1.62 mg/dL — ABNORMAL HIGH (ref 0.61–1.24)
GFR, Estimated: 41 mL/min — ABNORMAL LOW (ref >60)
Glucose, Bld: 94 mg/dL (ref 70–99)
Potassium: 4.7 mmol/L (ref 3.5–5.1)
Sodium: 136 mmol/L (ref 135–145)
Total Bilirubin: 0.6 mg/dL (ref 0.3–1.2)
Total Protein: 7.5 g/dL (ref 6.5–8.1)

## 2022-08-06 LAB — CBC WITH DIFFERENTIAL (CANCER CENTER ONLY)
Abs Immature Granulocytes: 0.01 10*3/uL (ref 0.00–0.07)
Basophils Absolute: 0.1 10*3/uL (ref 0.0–0.1)
Basophils Relative: 2 %
Eosinophils Absolute: 0.2 10*3/uL (ref 0.0–0.5)
Eosinophils Relative: 4 %
HCT: 37.4 % — ABNORMAL LOW (ref 39.0–52.0)
Hemoglobin: 12.7 g/dL — ABNORMAL LOW (ref 13.0–17.0)
Immature Granulocytes: 0 %
Lymphocytes Relative: 37 %
Lymphs Abs: 1.6 10*3/uL (ref 0.7–4.0)
MCH: 33.8 pg (ref 26.0–34.0)
MCHC: 34 g/dL (ref 30.0–36.0)
MCV: 99.5 fL (ref 80.0–100.0)
Monocytes Absolute: 0.3 10*3/uL (ref 0.1–1.0)
Monocytes Relative: 7 %
Neutro Abs: 2.1 10*3/uL (ref 1.7–7.7)
Neutrophils Relative %: 50 %
Platelet Count: 442 10*3/uL — ABNORMAL HIGH (ref 150–400)
RBC: 3.76 MIL/uL — ABNORMAL LOW (ref 4.22–5.81)
RDW: 14.6 % (ref 11.5–15.5)
WBC Count: 4.2 10*3/uL (ref 4.0–10.5)
nRBC: 0 % (ref 0.0–0.2)

## 2022-08-06 LAB — FERRITIN: Ferritin: 43 ng/mL (ref 24–336)

## 2022-08-06 MED ORDER — HYDROXYUREA 500 MG PO CAPS: ORAL_CAPSULE | ORAL | 1 refills | Status: DC

## 2022-08-06 NOTE — Assessment & Plan Note (Signed)
He was diagnosed in March 2014, initial platelet count in the 500k range, JAK2 mutation (+), he did not have bone marrow biopsy.  -We previously discussed that this is a myeloproliferative disorder, people usually do very well, and there is small percentage pts will develop myelofibrosis and AML late on.  -he initially started Hydrea in 08/2012, and his dose has been adjusted as needed. He tried anagrelide for one month, but this was not effective. He is currently on Hydrea 1000 mg MWF and 500 mg other days. He is tolerating well.  -Lab reviewed, hemoglobin 12.1, platelet 541k.  He tells me today he is still taking '1000mg'$  only 3 days a week, instead of on 4 days of the week. We reviewed this today, and he will adjust this. -Repeat lab in 3 and 6 months

## 2022-08-06 NOTE — Assessment & Plan Note (Signed)
-  M spike 0.2 with IgG monoclonal protein with lambda light chain specificity on 12/06/17.  -Cr on 10/11/17 was elevated to 1.34, normal previously; has normalized on 02/14/18 -01/24/18 bone survey is negative for lytic lesions  -Last M-Protein in 05/2022 was not observed. Light chain was stable. Will continue monitoring MM panel every 6 months.

## 2022-08-06 NOTE — Progress Notes (Signed)
[facility]   Telephone:([phone] Fax:(336) [phone]   Clinic Follow up Note   Patient Care Team: Wenda Low, MD as PCP - General (Internal Medicine) Nobie Putnam, MD (Hematology and Oncology) Garlan Fair, MD as Attending Physician (Gastroenterology) Rana Snare, MD (Inactive) as Attending Physician (Urology)  Date of Service:  08/06/2022  CHIEF COMPLAINT: f/u of  essential thrombosis   CURRENT THERAPY:  Hydrea, currently 500 mg MWF and 1000 mg other days    ASSESSMENT:  Ricky Santos is a 89 y.o. male with   Essential thrombocytosis ([place]) He was diagnosed in March 2014, initial platelet count in the 500k range, JAK2 mutation (+), he did not have bone marrow biopsy.  -We previously discussed that this is a myeloproliferative disorder, people usually do very well, and there is small percentage pts will develop myelofibrosis and AML late on.  -he initially started Hydrea in 08/2012, and his dose has been adjusted as needed. He tried anagrelide for one month, but this was not effective. He is currently on Hydrea 500 mg MWF and 1000 mg other days. He is tolerating well.  -Lab reviewed, hemoglobin 12.7, platelet 442.Marland Kitchen  Overall stable, will continue current dose of Hydrea. -Clinically stable, no new symptoms, continue monitoring.  Iron deficiency anemia -Etiology of iron deficiency remains unclear. His EGD and colonoscopy with Dr. Michail Sermon in 03/2018 were benign  -Continue oral ferrous sulfate once daily  -anemia and iron panel resolved in 07/2019  MGUS (monoclonal gammopathy of unknown significance) -M spike 0.2 with IgG monoclonal protein with lambda light chain specificity on 12/06/17.  -Cr on 10/11/17 was elevated to 1.34, normal previously; has normalized on 02/14/18 -01/24/18 bone survey is negative for lytic lesions  -Last M-Protein in 05/2022 was not observed. Light chain was stable. Will continue monitoring MM panel every 6 months.      PLAN: -lab  reviewed - monitor M-protein  -Continue Hydrea at the same dose -lab q3 month, f/u q6 months.   INTERVAL HISTORY:  Ricky Santos is here for a follow up of  essential thrombosis  He was last seen by me on 02/05/2022 He presents to the clinic alone.Pt  reports that his energy level is low. Pt reports he is still able to take care of himself. Pt denies having SOB. Pt states he has dizziness every day.    All other systems were reviewed with the patient and are negative.  MEDICAL HISTORY:  Past Medical History:  Diagnosis Date   Back pain    Essential thrombocytosis ([place]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    Essential thrombocytosis ([place]) 08/26/2012   GERD (gastroesophageal reflux disease)    Hernia    Hyperlipidemia    Hypertension    Hypothyroid    Prostate cancer [facility])     SURGICAL HISTORY: Past Surgical History:  Procedure Laterality Date   BACK SURGERY     COLONOSCOPY  2003   COLONOSCOPY N/A 01/17/2013   Procedure: COLONOSCOPY;  Surgeon: Garlan Fair, MD;  Location: [facility];  Service: Endoscopy;  Laterality: N/A;   exp lap for trauma     HERNIA REPAIR     INCISION AND DRAINAGE ABSCESS ANAL  08/01/05   PROSTATE SURGERY     SHOULDER SURGERY     rt    I have reviewed the social history and family history with the patient and they are unchanged from previous note.  ALLERGIES:  has No Known Allergies.  MEDICATIONS:  Current Outpatient Medications  Medication Sig Dispense Refill   ADVAIR DISKUS 250-50 MCG/ACT AEPB Inhale 1 puff into the lungs 2 (two) times daily.     aspirin 81 MG tablet Take 81 mg by mouth daily.       atorvastatin (LIPITOR) 40 MG tablet Take 40 mg by mouth daily.       finasteride (PROSCAR) 5 MG tablet Take 1 tablet (5 mg total) by mouth daily. 90 tablet 3   hydroxyurea (HYDREA) 500 MG capsule Take 1 tabs on Monday, Wednesday and Friday and 2 tabs daily for the rest of week. May take with food to minimize GI side  effects. 150 capsule 1   levothyroxine (SYNTHROID, LEVOTHROID) 25 MCG tablet Take 25 mcg by mouth daily.       losartan-hydrochlorothiazide (HYZAAR) 50-12.5 MG tablet Take 1 tablet by mouth daily.     meclizine (ANTIVERT) 25 MG tablet Take 1 tablet (25 mg total) by mouth 3 (three) times daily as needed for dizziness. 30 tablet 0   MIRALAX 17 GM/SCOOP powder Take 17 g by mouth daily.     mirtazapine (REMERON) 15 MG tablet Take 15 mg by mouth at bedtime.      pantoprazole (PROTONIX) 40 MG tablet Take 40 mg by mouth daily.       tamsulosin (FLOMAX) 0.4 MG CAPS capsule Take 0.4 mg by mouth every morning.     TIADYLT ER 240 MG 24 hr capsule Take 240 mg by mouth daily.     No current facility-administered medications for this visit.    PHYSICAL EXAMINATION: ECOG PERFORMANCE STATUS: 1 - Symptomatic but completely ambulatory  Vitals:   08/06/22 1133  BP: (!) 146/83  Pulse: 73  Resp: 16  Temp: 98.3 F (36.8 C)  SpO2: 100%   Wt Readings from Last 3 Encounters:  08/06/22 162 lb 4.8 oz (73.6 kg)  02/05/22 161 lb 3.2 oz (73.1 kg)  12/15/21 158 lb (71.7 kg)     GENERAL:alert, no distress and comfortable SKIN: skin color normal, no rashes or significant lesions EYES: normal, Conjunctiva are pink and non-injected, sclera clear  NEURO: alert & oriented x 3 with fluent speech   LABORATORY DATA:  I have reviewed the data as listed    Latest Ref Rng & Units 08/06/2022   10:58 AM 05/07/2022   10:58 AM 02/05/2022   11:04 AM  CBC  WBC 4.0 - 10.5 K/uL 4.2  6.1  5.7   Hemoglobin 13.0 - 17.0 g/dL 12.7  12.6  12.1   Hematocrit 39.0 - 52.0 % 37.4  38.2  35.7   Platelets 150 - 400 K/uL 442  526  541         Latest Ref Rng & Units 08/06/2022   10:58 AM 05/07/2022   10:58 AM 02/05/2022   11:04 AM  CMP  Glucose 70 - 99 mg/dL 94  74  105   BUN 8 - 23 mg/dL '24  17  19   '$ Creatinine 0.61 - 1.24 mg/dL 1.62  1.30  1.51   Sodium 135 - 145 mmol/L 136  135  137   Potassium 3.5 - 5.1 mmol/L 4.7  4.4  3.9    Chloride 98 - 111 mmol/L 106  104  105   CO2 22 - 32 mmol/L '21  23  25   '$ Calcium 8.9 - 10.3 mg/dL 9.4  9.7  9.1   Total Protein 6.5 - 8.1 g/dL 7.5  7.2  6.9   Total  Bilirubin 0.3 - 1.2 mg/dL 0.6  0.4  0.6   Alkaline Phos 38 - 126 U/L 54  60  51   AST 15 - 41 U/L '20  18  20   '$ ALT 0 - 44 U/L '7  9  11       '$ RADIOGRAPHIC STUDIES: I have personally reviewed the radiological images as listed and agreed with the findings in the report. No results found.    No orders of the defined types were placed in this encounter.  All questions were answered. The patient knows to call the clinic with any problems, questions or concerns. No barriers to learning was detected. The total time spent in the appointment was 15 minutes.     Truitt Merle, MD 08/06/2022   Felicity Coyer, CMA, am acting as scribe for Truitt Merle, MD.   I have reviewed the above documentation for accuracy and completeness, and I agree with the above.

## 2022-08-06 NOTE — Assessment & Plan Note (Signed)
-  Etiology of iron deficiency remains unclear. His EGD and colonoscopy with Dr. Michail Sermon in 03/2018 were benign  -Continue oral ferrous sulfate once daily  -anemia and iron panel resolved in 07/2019

## 2022-08-07 LAB — KAPPA/LAMBDA LIGHT CHAINS
Kappa free light chain: 53.7 mg/L — ABNORMAL HIGH (ref 3.3–19.4)
Kappa, lambda light chain ratio: 1.98 — ABNORMAL HIGH (ref 0.26–1.65)
Lambda free light chains: 27.1 mg/L — ABNORMAL HIGH (ref 5.7–26.3)

## 2022-08-10 LAB — PROTEIN ELECTROPHORESIS, SERUM, WITH REFLEX
A/G Ratio: 1.3 (ref 0.7–1.7)
Albumin ELP: 3.8 g/dL (ref 2.9–4.4)
Alpha-1-Globulin: 0.2 g/dL (ref 0.0–0.4)
Alpha-2-Globulin: 0.7 g/dL (ref 0.4–1.0)
Beta Globulin: 0.9 g/dL (ref 0.7–1.3)
Gamma Globulin: 1.1 g/dL (ref 0.4–1.8)
Globulin, Total: 3 g/dL (ref 2.2–3.9)
Total Protein ELP: 6.8 g/dL (ref 6.0–8.5)

## 2022-09-02 DIAGNOSIS — C61 Malignant neoplasm of prostate: Secondary | ICD-10-CM | POA: Diagnosis not present

## 2022-09-10 DIAGNOSIS — C61 Malignant neoplasm of prostate: Secondary | ICD-10-CM | POA: Diagnosis not present

## 2022-09-24 ENCOUNTER — Other Ambulatory Visit (HOSPITAL_COMMUNITY): Payer: Self-pay | Admitting: Urology

## 2022-09-24 DIAGNOSIS — C61 Malignant neoplasm of prostate: Secondary | ICD-10-CM

## 2022-09-29 ENCOUNTER — Other Ambulatory Visit (HOSPITAL_COMMUNITY): Payer: Self-pay | Admitting: Urology

## 2022-09-29 DIAGNOSIS — C61 Malignant neoplasm of prostate: Secondary | ICD-10-CM

## 2022-10-14 DIAGNOSIS — J440 Chronic obstructive pulmonary disease with acute lower respiratory infection: Secondary | ICD-10-CM | POA: Diagnosis not present

## 2022-10-14 DIAGNOSIS — I10 Essential (primary) hypertension: Secondary | ICD-10-CM | POA: Diagnosis not present

## 2022-10-14 DIAGNOSIS — D473 Essential (hemorrhagic) thrombocythemia: Secondary | ICD-10-CM | POA: Diagnosis not present

## 2022-10-14 DIAGNOSIS — E039 Hypothyroidism, unspecified: Secondary | ICD-10-CM | POA: Diagnosis not present

## 2022-10-14 DIAGNOSIS — N400 Enlarged prostate without lower urinary tract symptoms: Secondary | ICD-10-CM | POA: Diagnosis not present

## 2022-10-14 DIAGNOSIS — C61 Malignant neoplasm of prostate: Secondary | ICD-10-CM | POA: Diagnosis not present

## 2022-10-14 DIAGNOSIS — E782 Mixed hyperlipidemia: Secondary | ICD-10-CM | POA: Diagnosis not present

## 2022-10-14 DIAGNOSIS — N1831 Chronic kidney disease, stage 3a: Secondary | ICD-10-CM | POA: Diagnosis not present

## 2022-10-14 DIAGNOSIS — Z0000 Encounter for general adult medical examination without abnormal findings: Secondary | ICD-10-CM | POA: Diagnosis not present

## 2022-10-21 ENCOUNTER — Encounter (HOSPITAL_COMMUNITY)
Admission: RE | Admit: 2022-10-21 | Discharge: 2022-10-21 | Disposition: A | Discharge status: Home / Self Care | Payer: Medicare HMO | Source: Ambulatory Visit | Attending: Urology | Admitting: Urology

## 2022-10-21 DIAGNOSIS — C61 Malignant neoplasm of prostate: Secondary | ICD-10-CM | POA: Diagnosis not present

## 2022-10-21 MED ORDER — PIFLIFOLASTAT F 18 (PYLARIFY) INJECTION
9.0000 | Freq: Once | INTRAVENOUS | Status: AC
  Administered 2022-10-21: 7.91 via INTRAVENOUS

## 2022-11-03 ENCOUNTER — Inpatient Hospital Stay: Payer: Medicare HMO | Attending: Hematology

## 2022-11-03 ENCOUNTER — Other Ambulatory Visit: Payer: Self-pay

## 2022-11-03 DIAGNOSIS — D473 Essential (hemorrhagic) thrombocythemia: Secondary | ICD-10-CM | POA: Insufficient documentation

## 2022-11-03 DIAGNOSIS — D649 Anemia, unspecified: Secondary | ICD-10-CM

## 2022-11-03 DIAGNOSIS — D472 Monoclonal gammopathy: Secondary | ICD-10-CM

## 2022-11-03 LAB — CBC WITH DIFFERENTIAL (CANCER CENTER ONLY)
Abs Immature Granulocytes: 0.02 10*3/uL (ref 0.00–0.07)
Basophils Absolute: 0.1 10*3/uL (ref 0.0–0.1)
Basophils Relative: 1 %
Eosinophils Absolute: 0.2 10*3/uL (ref 0.0–0.5)
Eosinophils Relative: 4 %
HCT: 35.8 % — ABNORMAL LOW (ref 39.0–52.0)
Hemoglobin: 11.9 g/dL — ABNORMAL LOW (ref 13.0–17.0)
Immature Granulocytes: 0 %
Lymphocytes Relative: 26 %
Lymphs Abs: 1.5 10*3/uL (ref 0.7–4.0)
MCH: 32.3 pg (ref 26.0–34.0)
MCHC: 33.2 g/dL (ref 30.0–36.0)
MCV: 97.3 fL (ref 80.0–100.0)
Monocytes Absolute: 0.5 10*3/uL (ref 0.1–1.0)
Monocytes Relative: 8 %
Neutro Abs: 3.6 10*3/uL (ref 1.7–7.7)
Neutrophils Relative %: 61 %
Platelet Count: 303 10*3/uL (ref 150–400)
RBC: 3.68 MIL/uL — ABNORMAL LOW (ref 4.22–5.81)
RDW: 14.4 % (ref 11.5–15.5)
WBC Count: 5.8 10*3/uL (ref 4.0–10.5)
nRBC: 0 % (ref 0.0–0.2)

## 2022-11-03 LAB — CMP (CANCER CENTER ONLY)
ALT: 13 U/L (ref 0–44)
AST: 22 U/L (ref 15–41)
Albumin: 4.1 g/dL (ref 3.5–5.0)
Alkaline Phosphatase: 56 U/L (ref 38–126)
Anion gap: 8 (ref 5–15)
BUN: 22 mg/dL (ref 8–23)
CO2: 23 mmol/L (ref 22–32)
Calcium: 9.4 mg/dL (ref 8.9–10.3)
Chloride: 104 mmol/L (ref 98–111)
Creatinine: 1.55 mg/dL — ABNORMAL HIGH (ref 0.61–1.24)
GFR, Estimated: 43 mL/min — ABNORMAL LOW (ref >60)
Glucose, Bld: 95 mg/dL (ref 70–99)
Potassium: 4 mmol/L (ref 3.5–5.1)
Sodium: 135 mmol/L (ref 135–145)
Total Bilirubin: 0.7 mg/dL (ref 0.3–1.2)
Total Protein: 7.5 g/dL (ref 6.5–8.1)

## 2022-11-03 LAB — FERRITIN: Ferritin: 102 ng/mL (ref 24–336)

## 2022-11-03 LAB — IRON AND IRON BINDING CAPACITY (CC-WL,HP ONLY)
Iron: 150 ug/dL (ref 45–182)
Saturation Ratios: 48 % — ABNORMAL HIGH (ref 17.9–39.5)
TIBC: 312 ug/dL (ref 250–450)
UIBC: 162 ug/dL (ref 117–376)

## 2022-11-04 LAB — KAPPA/LAMBDA LIGHT CHAINS
Kappa free light chain: 60.7 mg/L — ABNORMAL HIGH (ref 3.3–19.4)
Kappa, lambda light chain ratio: 1.91 — ABNORMAL HIGH (ref 0.26–1.65)
Lambda free light chains: 31.8 mg/L — ABNORMAL HIGH (ref 5.7–26.3)

## 2022-11-09 DIAGNOSIS — N401 Enlarged prostate with lower urinary tract symptoms: Secondary | ICD-10-CM | POA: Diagnosis not present

## 2022-11-09 DIAGNOSIS — R3915 Urgency of urination: Secondary | ICD-10-CM | POA: Diagnosis not present

## 2022-11-09 DIAGNOSIS — C61 Malignant neoplasm of prostate: Secondary | ICD-10-CM | POA: Diagnosis not present

## 2022-11-10 LAB — PROTEIN ELECTROPHORESIS, SERUM, WITH REFLEX
A/G Ratio: 1 (ref 0.7–1.7)
Albumin ELP: 3.5 g/dL (ref 2.9–4.4)
Alpha-1-Globulin: 0.4 g/dL (ref 0.0–0.4)
Alpha-2-Globulin: 0.9 g/dL (ref 0.4–1.0)
Beta Globulin: 0.9 g/dL (ref 0.7–1.3)
Gamma Globulin: 1.3 g/dL (ref 0.4–1.8)
Globulin, Total: 3.4 g/dL (ref 2.2–3.9)
Total Protein ELP: 6.9 g/dL (ref 6.0–8.5)

## 2022-11-12 DIAGNOSIS — I10 Essential (primary) hypertension: Secondary | ICD-10-CM | POA: Diagnosis not present

## 2022-11-12 DIAGNOSIS — R946 Abnormal results of thyroid function studies: Secondary | ICD-10-CM | POA: Diagnosis not present

## 2023-01-18 DIAGNOSIS — E039 Hypothyroidism, unspecified: Secondary | ICD-10-CM | POA: Diagnosis not present

## 2023-02-03 ENCOUNTER — Inpatient Hospital Stay: Payer: Medicare HMO | Attending: Hematology

## 2023-02-03 ENCOUNTER — Inpatient Hospital Stay: Payer: Medicare HMO | Admitting: Hematology

## 2023-02-03 VITALS — BP 150/88 | HR 71 | Temp 97.7°F | Resp 16 | Ht 69.0 in | Wt 155.8 lb

## 2023-02-03 DIAGNOSIS — K219 Gastro-esophageal reflux disease without esophagitis: Secondary | ICD-10-CM | POA: Insufficient documentation

## 2023-02-03 DIAGNOSIS — Z79899 Other long term (current) drug therapy: Secondary | ICD-10-CM | POA: Insufficient documentation

## 2023-02-03 DIAGNOSIS — E785 Hyperlipidemia, unspecified: Secondary | ICD-10-CM | POA: Insufficient documentation

## 2023-02-03 DIAGNOSIS — Z7982 Long term (current) use of aspirin: Secondary | ICD-10-CM | POA: Diagnosis not present

## 2023-02-03 DIAGNOSIS — M549 Dorsalgia, unspecified: Secondary | ICD-10-CM | POA: Insufficient documentation

## 2023-02-03 DIAGNOSIS — I10 Essential (primary) hypertension: Secondary | ICD-10-CM | POA: Insufficient documentation

## 2023-02-03 DIAGNOSIS — E039 Hypothyroidism, unspecified: Secondary | ICD-10-CM | POA: Diagnosis not present

## 2023-02-03 DIAGNOSIS — D649 Anemia, unspecified: Secondary | ICD-10-CM

## 2023-02-03 DIAGNOSIS — Z79890 Hormone replacement therapy (postmenopausal): Secondary | ICD-10-CM | POA: Insufficient documentation

## 2023-02-03 DIAGNOSIS — D473 Essential (hemorrhagic) thrombocythemia: Secondary | ICD-10-CM

## 2023-02-03 DIAGNOSIS — D472 Monoclonal gammopathy: Secondary | ICD-10-CM

## 2023-02-03 LAB — CMP (CANCER CENTER ONLY)
ALT: 9 U/L (ref 0–44)
AST: 22 U/L (ref 15–41)
Albumin: 4.2 g/dL (ref 3.5–5.0)
Alkaline Phosphatase: 53 U/L (ref 38–126)
Anion gap: 7 (ref 5–15)
BUN: 20 mg/dL (ref 8–23)
CO2: 24 mmol/L (ref 22–32)
Calcium: 9.5 mg/dL (ref 8.9–10.3)
Chloride: 108 mmol/L (ref 98–111)
Creatinine: 1.43 mg/dL — ABNORMAL HIGH (ref 0.61–1.24)
GFR, Estimated: 47 mL/min — ABNORMAL LOW (ref >60)
Glucose, Bld: 99 mg/dL (ref 70–99)
Potassium: 4.4 mmol/L (ref 3.5–5.1)
Sodium: 139 mmol/L (ref 135–145)
Total Bilirubin: 0.8 mg/dL (ref 0.3–1.2)
Total Protein: 7.3 g/dL (ref 6.5–8.1)

## 2023-02-03 LAB — CBC WITH DIFFERENTIAL (CANCER CENTER ONLY)
Abs Immature Granulocytes: 0.03 10*3/uL (ref 0.00–0.07)
Basophils Absolute: 0.1 10*3/uL (ref 0.0–0.1)
Basophils Relative: 3 %
Eosinophils Absolute: 0.2 10*3/uL (ref 0.0–0.5)
Eosinophils Relative: 4 %
HCT: 38.9 % — ABNORMAL LOW (ref 39.0–52.0)
Hemoglobin: 13.2 g/dL (ref 13.0–17.0)
Immature Granulocytes: 1 %
Lymphocytes Relative: 32 %
Lymphs Abs: 1.7 10*3/uL (ref 0.7–4.0)
MCH: 32.1 pg (ref 26.0–34.0)
MCHC: 33.9 g/dL (ref 30.0–36.0)
MCV: 94.6 fL (ref 80.0–100.0)
Monocytes Absolute: 0.5 10*3/uL (ref 0.1–1.0)
Monocytes Relative: 9 %
Neutro Abs: 2.7 10*3/uL (ref 1.7–7.7)
Neutrophils Relative %: 51 %
Platelet Count: 317 10*3/uL (ref 150–400)
RBC: 4.11 MIL/uL — ABNORMAL LOW (ref 4.22–5.81)
RDW: 14 % (ref 11.5–15.5)
WBC Count: 5.2 10*3/uL (ref 4.0–10.5)
nRBC: 0 % (ref 0.0–0.2)

## 2023-02-03 LAB — IRON AND IRON BINDING CAPACITY (CC-WL,HP ONLY)
Iron: 133 ug/dL (ref 45–182)
Saturation Ratios: 36 % (ref 17.9–39.5)
TIBC: 368 ug/dL (ref 250–450)
UIBC: 235 ug/dL (ref 117–376)

## 2023-02-03 LAB — FERRITIN: Ferritin: 38 ng/mL (ref 24–336)

## 2023-02-03 MED ORDER — HYDROXYUREA 500 MG PO CAPS
ORAL_CAPSULE | ORAL | 1 refills | Status: DC
Start: 2023-02-03 — End: 2024-02-22

## 2023-02-03 NOTE — Assessment & Plan Note (Signed)
He was diagnosed in March 2014, initial platelet count in the 500k range, JAK2 mutation (+), he did not have bone marrow biopsy.  -We previously discussed that this is a myeloproliferative disorder, people usually do very well, and there is small percentage pts will develop myelofibrosis and AML late on.  -he initially started Hydrea in 08/2012, and his dose has been adjusted as needed. He tried anagrelide for one month, but this was not effective. He is currently on Hydrea 1000 mg MWF and 500 mg other days. He is tolerating well.  -Lab reviewed, hemoglobin 12.1, platelet 541k.  He tells me today he is still taking '1000mg'$  only 3 days a week, instead of on 4 days of the week. We reviewed this today, and he will adjust this. -Repeat lab in 3 and 6 months

## 2023-02-03 NOTE — Progress Notes (Signed)
[facility]   Telephone:(336) [phone] Fax:(336) [phone]   Clinic Follow up Note   Patient Care Team: Georgann Housekeeper, MD as PCP - General (Internal Medicine) Exie Parody, MD (Hematology and Oncology) Charolett Bumpers, MD as Attending Physician (Gastroenterology) Barron Alvine, MD (Inactive) as Attending Physician (Urology)  Date of Service:  02/03/2023  CHIEF COMPLAINT: f/u of essential thrombosis     CURRENT THERAPY:  Hydrea, currently 500 mg Monday and Wednesday and 1000 mg other days     ASSESSMENT:  Ricky Santos is a 89 y.o. male with   Essential thrombocytosis ([facility]) He was diagnosed in March 2014, initial platelet count in the 500k range, JAK2 mutation (+), he did not have bone marrow biopsy.  -We previously discussed that this is a myeloproliferative disorder, people usually do very well, and there is small percentage pts will develop myelofibrosis and AML late on.  -he initially started Hydrea in 08/2012, and his dose has been adjusted as needed. He tried anagrelide for one month, but this was not effective. He is currently on Hydrea 1000 mg MWF and 500 mg other days. He is tolerating well.  -Lab reviewed, hemoglobin 1302, plt 317K.  He tells me today he is still taking 1000mg  only 4 days a week, and 500mg  3 days a week.. will continue same dose  -Repeat lab in 3 and 6 months     PLAN: - lab reviewed hg 13.2 -I refill hydrea -lab 3 and 6 months -f/u phone visit in 6 months   INTERVAL HISTORY:  Ricky Santos is here for a follow up of essential thrombosis . He was last seen by me on 08/06/2022. He presents to the clinic alone. Pt state that he is doing well and he has no concerns. Pt is taking Hydrea as prescribe.      All other systems were reviewed with the patient and are negative.  MEDICAL HISTORY:  Past Medical History:  Diagnosis Date   Back pain    Essential thrombocytosis ([facility]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     Essential thrombocytosis ([facility]) 08/26/2012   GERD (gastroesophageal reflux disease)    Hernia    Hyperlipidemia    Hypertension    Hypothyroid    Prostate cancer [facility])     SURGICAL HISTORY: Past Surgical History:  Procedure Laterality Date   BACK SURGERY     COLONOSCOPY  2003   COLONOSCOPY N/A 01/17/2013   Procedure: COLONOSCOPY;  Surgeon: Charolett Bumpers, MD;  Location: [facility];  Service: Endoscopy;  Laterality: N/A;   exp lap for trauma     HERNIA REPAIR     INCISION AND DRAINAGE ABSCESS ANAL  08/01/05   PROSTATE SURGERY     SHOULDER SURGERY     rt    I have reviewed the social history and family history with the patient and they are unchanged from previous note.  ALLERGIES:  has No Known Allergies.  MEDICATIONS:  Current Outpatient Medications  Medication Sig Dispense Refill   ADVAIR DISKUS 250-50 MCG/ACT AEPB Inhale 1 puff into the lungs 2 (two) times daily.     aspirin 81 MG tablet Take 81 mg by mouth daily.       atorvastatin (LIPITOR) 40 MG tablet Take 40 mg by mouth daily.       finasteride (PROSCAR) 5 MG tablet Take 1 tablet (5 mg total) by mouth daily. 90 tablet 3   hydroxyurea (HYDREA) 500 MG capsule Take  1 tabs on Monday, Wednesday and Friday and 2 tabs daily for the rest of week. May take with food to minimize GI side effects. 150 capsule 1   levothyroxine (SYNTHROID, LEVOTHROID) 25 MCG tablet Take 25 mcg by mouth daily.       losartan-hydrochlorothiazide (HYZAAR) 50-12.5 MG tablet Take 1 tablet by mouth daily.     meclizine (ANTIVERT) 25 MG tablet Take 1 tablet (25 mg total) by mouth 3 (three) times daily as needed for dizziness. 30 tablet 0   MIRALAX 17 GM/SCOOP powder Take 17 g by mouth daily.     mirtazapine (REMERON) 15 MG tablet Take 15 mg by mouth at bedtime.      pantoprazole (PROTONIX) 40 MG tablet Take 40 mg by mouth daily.       tamsulosin (FLOMAX) 0.4 MG CAPS capsule Take 0.4 mg by mouth every morning.     TIADYLT ER 240 MG 24 hr capsule Take  240 mg by mouth daily.     No current facility-administered medications for this visit.    PHYSICAL EXAMINATION: ECOG PERFORMANCE STATUS: 1 - Symptomatic but completely ambulatory  Vitals:   02/03/23 1249  BP: (!) 150/88  Pulse: 71  Resp: 16  Temp: 97.7 F (36.5 C)  SpO2: 100%   Wt Readings from Last 3 Encounters:  02/03/23 155 lb 12.8 oz (70.7 kg)  08/06/22 162 lb 4.8 oz (73.6 kg)  02/05/22 161 lb 3.2 oz (73.1 kg)     GENERAL:alert, no distress and comfortable SKIN: skin color normal, no rashes or significant lesions EYES: normal, Conjunctiva are pink and non-injected, sclera clear  NEURO: alert & oriented x 3 with fluent speech  LABORATORY DATA:  I have reviewed the data as listed    Latest Ref Rng & Units 02/03/2023   10:48 AM 11/03/2022   10:56 AM 08/06/2022   10:58 AM  CBC  WBC 4.0 - 10.5 K/uL 5.2  5.8  4.2   Hemoglobin 13.0 - 17.0 g/dL 29.5  28.4  13.2   Hematocrit 39.0 - 52.0 % 38.9  35.8  37.4   Platelets 150 - 400 K/uL 317  303  442         Latest Ref Rng & Units 02/03/2023   10:48 AM 11/03/2022   10:56 AM 08/06/2022   10:58 AM  CMP  Glucose 70 - 99 mg/dL 99  95  94   BUN 8 - 23 mg/dL 20  22  24    Creatinine 0.61 - 1.24 mg/dL 4.40  1.02  7.25   Sodium 135 - 145 mmol/L 139  135  136   Potassium 3.5 - 5.1 mmol/L 4.4  4.0  4.7   Chloride 98 - 111 mmol/L 108  104  106   CO2 22 - 32 mmol/L 24  23  21    Calcium 8.9 - 10.3 mg/dL 9.5  9.4  9.4   Total Protein 6.5 - 8.1 g/dL 7.3  7.5  7.5   Total Bilirubin 0.3 - 1.2 mg/dL 0.8  0.7  0.6   Alkaline Phos 38 - 126 U/L 53  56  54   AST 15 - 41 U/L 22  22  20    ALT 0 - 44 U/L 9  13  7        RADIOGRAPHIC STUDIES: I have personally reviewed the radiological images as listed and agreed with the findings in the report. No results found.    No orders of the defined types were placed in this encounter.  All questions were answered. The patient knows to call the clinic with any problems, questions or concerns. No  barriers to learning was detected. The total time spent in the appointment was 15 minutes.     Malachy Mood, MD 02/03/2023   Carolin Coy, CMA, am acting as scribe for Malachy Mood, MD.   I have reviewed the above documentation for accuracy and completeness, and I agree with the above.

## 2023-02-04 LAB — KAPPA/LAMBDA LIGHT CHAINS
Kappa free light chain: 49.3 mg/L — ABNORMAL HIGH (ref 3.3–19.4)
Kappa, lambda light chain ratio: 1.88 — ABNORMAL HIGH (ref 0.26–1.65)
Lambda free light chains: 26.2 mg/L (ref 5.7–26.3)

## 2023-02-05 LAB — PROTEIN ELECTROPHORESIS, SERUM, WITH REFLEX
A/G Ratio: 1.1 (ref 0.7–1.7)
Albumin ELP: 3.7 g/dL (ref 2.9–4.4)
Alpha-1-Globulin: 0.3 g/dL (ref 0.0–0.4)
Alpha-2-Globulin: 0.8 g/dL (ref 0.4–1.0)
Beta Globulin: 1 g/dL (ref 0.7–1.3)
Gamma Globulin: 1.3 g/dL (ref 0.4–1.8)
Globulin, Total: 3.4 g/dL (ref 2.2–3.9)
Total Protein ELP: 7.1 g/dL (ref 6.0–8.5)

## 2023-02-09 ENCOUNTER — Telehealth: Payer: Self-pay

## 2023-02-09 NOTE — Telephone Encounter (Addendum)
Called patient and relayed message below as per Dr.Feng. Patent voiced full understanding.    ----- Message from Malachy Mood sent at 02/09/2023  9:38 AM EDT ----- Please let pt know his lab are stable, no new concerns, thx   Malachy Mood

## 2023-02-16 DIAGNOSIS — E039 Hypothyroidism, unspecified: Secondary | ICD-10-CM | POA: Diagnosis not present

## 2023-02-16 DIAGNOSIS — C61 Malignant neoplasm of prostate: Secondary | ICD-10-CM | POA: Diagnosis not present

## 2023-02-16 DIAGNOSIS — I10 Essential (primary) hypertension: Secondary | ICD-10-CM | POA: Diagnosis not present

## 2023-02-16 DIAGNOSIS — Z23 Encounter for immunization: Secondary | ICD-10-CM | POA: Diagnosis not present

## 2023-02-16 DIAGNOSIS — N1831 Chronic kidney disease, stage 3a: Secondary | ICD-10-CM | POA: Diagnosis not present

## 2023-02-16 DIAGNOSIS — D473 Essential (hemorrhagic) thrombocythemia: Secondary | ICD-10-CM | POA: Diagnosis not present

## 2023-02-16 DIAGNOSIS — E782 Mixed hyperlipidemia: Secondary | ICD-10-CM | POA: Diagnosis not present

## 2023-02-16 DIAGNOSIS — D472 Monoclonal gammopathy: Secondary | ICD-10-CM | POA: Diagnosis not present

## 2023-02-16 DIAGNOSIS — J440 Chronic obstructive pulmonary disease with acute lower respiratory infection: Secondary | ICD-10-CM | POA: Diagnosis not present

## 2023-03-04 DIAGNOSIS — R3915 Urgency of urination: Secondary | ICD-10-CM | POA: Diagnosis not present

## 2023-03-04 DIAGNOSIS — R972 Elevated prostate specific antigen [PSA]: Secondary | ICD-10-CM | POA: Diagnosis not present

## 2023-03-04 DIAGNOSIS — N401 Enlarged prostate with lower urinary tract symptoms: Secondary | ICD-10-CM | POA: Diagnosis not present

## 2023-05-05 ENCOUNTER — Other Ambulatory Visit: Payer: Self-pay

## 2023-05-05 ENCOUNTER — Inpatient Hospital Stay: Payer: Medicare HMO | Attending: Hematology

## 2023-05-05 DIAGNOSIS — D509 Iron deficiency anemia, unspecified: Secondary | ICD-10-CM

## 2023-05-05 DIAGNOSIS — D649 Anemia, unspecified: Secondary | ICD-10-CM

## 2023-05-05 DIAGNOSIS — D473 Essential (hemorrhagic) thrombocythemia: Secondary | ICD-10-CM

## 2023-05-05 DIAGNOSIS — D472 Monoclonal gammopathy: Secondary | ICD-10-CM

## 2023-05-05 LAB — CMP (CANCER CENTER ONLY)
ALT: 12 U/L (ref 0–44)
AST: 23 U/L (ref 15–41)
Albumin: 4.7 g/dL (ref 3.5–5.0)
Alkaline Phosphatase: 58 U/L (ref 38–126)
Anion gap: 9 (ref 5–15)
BUN: 22 mg/dL (ref 8–23)
CO2: 25 mmol/L (ref 22–32)
Calcium: 9.8 mg/dL (ref 8.9–10.3)
Chloride: 103 mmol/L (ref 98–111)
Creatinine: 1.58 mg/dL — ABNORMAL HIGH (ref 0.61–1.24)
GFR, Estimated: 42 mL/min — ABNORMAL LOW (ref >60)
Glucose, Bld: 80 mg/dL (ref 70–99)
Potassium: 4 mmol/L (ref 3.5–5.1)
Sodium: 137 mmol/L (ref 135–145)
Total Bilirubin: 0.6 mg/dL (ref <1.2)
Total Protein: 8.1 g/dL (ref 6.5–8.1)

## 2023-05-05 LAB — CBC WITH DIFFERENTIAL (CANCER CENTER ONLY)
Abs Immature Granulocytes: 0.03 10*3/uL (ref 0.00–0.07)
Basophils Absolute: 0.1 10*3/uL (ref 0.0–0.1)
Basophils Relative: 3 %
Eosinophils Absolute: 0.2 10*3/uL (ref 0.0–0.5)
Eosinophils Relative: 5 %
HCT: 38.5 % — ABNORMAL LOW (ref 39.0–52.0)
Hemoglobin: 12.9 g/dL — ABNORMAL LOW (ref 13.0–17.0)
Immature Granulocytes: 1 %
Lymphocytes Relative: 30 %
Lymphs Abs: 1.5 10*3/uL (ref 0.7–4.0)
MCH: 32.8 pg (ref 26.0–34.0)
MCHC: 33.5 g/dL (ref 30.0–36.0)
MCV: 98 fL (ref 80.0–100.0)
Monocytes Absolute: 0.3 10*3/uL (ref 0.1–1.0)
Monocytes Relative: 7 %
Neutro Abs: 2.8 10*3/uL (ref 1.7–7.7)
Neutrophils Relative %: 54 %
Platelet Count: 581 10*3/uL — ABNORMAL HIGH (ref 150–400)
RBC: 3.93 MIL/uL — ABNORMAL LOW (ref 4.22–5.81)
RDW: 15.3 % (ref 11.5–15.5)
WBC Count: 5 10*3/uL (ref 4.0–10.5)
nRBC: 0 % (ref 0.0–0.2)

## 2023-05-05 LAB — FERRITIN: Ferritin: 46 ng/mL (ref 24–336)

## 2023-05-07 ENCOUNTER — Telehealth: Payer: Self-pay

## 2023-05-07 NOTE — Telephone Encounter (Addendum)
Called and left message below on paitents voicemail. Left message to call back if he has any questions.   ----- Message from Pollyann Samples sent at 05/07/2023  8:24 AM EST ----- Please let pt know labs are stable so far, will let him know what MGUS panel shows once it's back.   Thanks Lacie NP

## 2023-05-08 ENCOUNTER — Other Ambulatory Visit: Payer: Self-pay | Admitting: Hematology

## 2023-05-08 DIAGNOSIS — D472 Monoclonal gammopathy: Secondary | ICD-10-CM

## 2023-05-10 LAB — PROTEIN ELECTROPHORESIS, SERUM, WITH REFLEX
A/G Ratio: 1.1 (ref 0.7–1.7)
Albumin ELP: 4 g/dL (ref 2.9–4.4)
Alpha-1-Globulin: 0.3 g/dL (ref 0.0–0.4)
Alpha-2-Globulin: 0.9 g/dL (ref 0.4–1.0)
Beta Globulin: 1.1 g/dL (ref 0.7–1.3)
Gamma Globulin: 1.4 g/dL (ref 0.4–1.8)
Globulin, Total: 3.7 g/dL (ref 2.2–3.9)
Total Protein ELP: 7.7 g/dL (ref 6.0–8.5)

## 2023-06-28 DIAGNOSIS — D472 Monoclonal gammopathy: Secondary | ICD-10-CM | POA: Diagnosis not present

## 2023-06-28 DIAGNOSIS — E039 Hypothyroidism, unspecified: Secondary | ICD-10-CM | POA: Diagnosis not present

## 2023-06-28 DIAGNOSIS — G629 Polyneuropathy, unspecified: Secondary | ICD-10-CM | POA: Diagnosis not present

## 2023-06-28 DIAGNOSIS — D473 Essential (hemorrhagic) thrombocythemia: Secondary | ICD-10-CM | POA: Diagnosis not present

## 2023-06-28 DIAGNOSIS — I10 Essential (primary) hypertension: Secondary | ICD-10-CM | POA: Diagnosis not present

## 2023-06-28 DIAGNOSIS — I700 Atherosclerosis of aorta: Secondary | ICD-10-CM | POA: Diagnosis not present

## 2023-06-28 DIAGNOSIS — C61 Malignant neoplasm of prostate: Secondary | ICD-10-CM | POA: Diagnosis not present

## 2023-06-28 DIAGNOSIS — N1831 Chronic kidney disease, stage 3a: Secondary | ICD-10-CM | POA: Diagnosis not present

## 2023-06-28 DIAGNOSIS — J440 Chronic obstructive pulmonary disease with acute lower respiratory infection: Secondary | ICD-10-CM | POA: Diagnosis not present

## 2023-08-04 ENCOUNTER — Inpatient Hospital Stay: Payer: Medicare HMO | Attending: Hematology

## 2023-08-04 DIAGNOSIS — E039 Hypothyroidism, unspecified: Secondary | ICD-10-CM | POA: Insufficient documentation

## 2023-08-04 DIAGNOSIS — E785 Hyperlipidemia, unspecified: Secondary | ICD-10-CM | POA: Insufficient documentation

## 2023-08-04 DIAGNOSIS — I10 Essential (primary) hypertension: Secondary | ICD-10-CM | POA: Diagnosis not present

## 2023-08-04 DIAGNOSIS — K449 Diaphragmatic hernia without obstruction or gangrene: Secondary | ICD-10-CM | POA: Diagnosis not present

## 2023-08-04 DIAGNOSIS — Z79899 Other long term (current) drug therapy: Secondary | ICD-10-CM | POA: Insufficient documentation

## 2023-08-04 DIAGNOSIS — D473 Essential (hemorrhagic) thrombocythemia: Secondary | ICD-10-CM | POA: Diagnosis not present

## 2023-08-04 DIAGNOSIS — Z8546 Personal history of malignant neoplasm of prostate: Secondary | ICD-10-CM | POA: Diagnosis not present

## 2023-08-04 DIAGNOSIS — K219 Gastro-esophageal reflux disease without esophagitis: Secondary | ICD-10-CM | POA: Insufficient documentation

## 2023-08-04 DIAGNOSIS — D649 Anemia, unspecified: Secondary | ICD-10-CM

## 2023-08-04 DIAGNOSIS — D509 Iron deficiency anemia, unspecified: Secondary | ICD-10-CM

## 2023-08-04 DIAGNOSIS — D472 Monoclonal gammopathy: Secondary | ICD-10-CM | POA: Diagnosis not present

## 2023-08-04 LAB — CMP (CANCER CENTER ONLY)
ALT: 8 U/L (ref 0–44)
AST: 19 U/L (ref 15–41)
Albumin: 4.3 g/dL (ref 3.5–5.0)
Alkaline Phosphatase: 55 U/L (ref 38–126)
Anion gap: 8 (ref 5–15)
BUN: 19 mg/dL (ref 8–23)
CO2: 24 mmol/L (ref 22–32)
Calcium: 9.2 mg/dL (ref 8.9–10.3)
Chloride: 105 mmol/L (ref 98–111)
Creatinine: 1.55 mg/dL — ABNORMAL HIGH (ref 0.61–1.24)
GFR, Estimated: 43 mL/min — ABNORMAL LOW (ref >60)
Glucose, Bld: 96 mg/dL (ref 70–99)
Potassium: 4.7 mmol/L (ref 3.5–5.1)
Sodium: 137 mmol/L (ref 135–145)
Total Bilirubin: 0.6 mg/dL (ref 0.0–1.2)
Total Protein: 7.3 g/dL (ref 6.5–8.1)

## 2023-08-04 LAB — CBC WITH DIFFERENTIAL (CANCER CENTER ONLY)
Abs Immature Granulocytes: 0.01 10*3/uL (ref 0.00–0.07)
Basophils Absolute: 0.1 10*3/uL (ref 0.0–0.1)
Basophils Relative: 3 %
Eosinophils Absolute: 0.2 10*3/uL (ref 0.0–0.5)
Eosinophils Relative: 4 %
HCT: 38.4 % — ABNORMAL LOW (ref 39.0–52.0)
Hemoglobin: 12 g/dL — ABNORMAL LOW (ref 13.0–17.0)
Immature Granulocytes: 0 %
Lymphocytes Relative: 32 %
Lymphs Abs: 1.4 10*3/uL (ref 0.7–4.0)
MCH: 29.8 pg (ref 26.0–34.0)
MCHC: 31.3 g/dL (ref 30.0–36.0)
MCV: 95.3 fL (ref 80.0–100.0)
Monocytes Absolute: 0.3 10*3/uL (ref 0.1–1.0)
Monocytes Relative: 6 %
Neutro Abs: 2.3 10*3/uL (ref 1.7–7.7)
Neutrophils Relative %: 55 %
Platelet Count: ADEQUATE 10*3/uL (ref 150–400)
RBC: 4.03 MIL/uL — ABNORMAL LOW (ref 4.22–5.81)
RDW: 14.5 % (ref 11.5–15.5)
Smear Review: ADEQUATE
WBC Count: 4.2 10*3/uL (ref 4.0–10.5)
nRBC: 0 % (ref 0.0–0.2)

## 2023-08-04 LAB — FERRITIN: Ferritin: 31 ng/mL (ref 24–336)

## 2023-08-05 LAB — KAPPA/LAMBDA LIGHT CHAINS
Kappa free light chain: 53.1 mg/L — ABNORMAL HIGH (ref 3.3–19.4)
Kappa, lambda light chain ratio: 1.95 — ABNORMAL HIGH (ref 0.26–1.65)
Lambda free light chains: 27.2 mg/L — ABNORMAL HIGH (ref 5.7–26.3)

## 2023-08-08 LAB — PROTEIN ELECTROPHORESIS, SERUM, WITH REFLEX
A/G Ratio: 1.3 (ref 0.7–1.7)
Albumin ELP: 4 g/dL (ref 2.9–4.4)
Alpha-1-Globulin: 0.2 g/dL (ref 0.0–0.4)
Alpha-2-Globulin: 0.7 g/dL (ref 0.4–1.0)
Beta Globulin: 1 g/dL (ref 0.7–1.3)
Gamma Globulin: 1.2 g/dL (ref 0.4–1.8)
Globulin, Total: 3.2 g/dL (ref 2.2–3.9)
Total Protein ELP: 7.2 g/dL (ref 6.0–8.5)

## 2023-08-09 ENCOUNTER — Other Ambulatory Visit: Payer: Self-pay

## 2023-08-09 ENCOUNTER — Telehealth: Payer: Self-pay

## 2023-08-09 ENCOUNTER — Telehealth: Payer: Self-pay | Admitting: Nurse Practitioner

## 2023-08-09 NOTE — Telephone Encounter (Signed)
 Left a voicemail on patient's contact in regards to scheduling appointment left callback number for patient if needing to cancel or reschedule; Patient's son is also aware of appointment and stated they would relay the message to patient if needing to cancel or reschedule appointment

## 2023-08-09 NOTE — Telephone Encounter (Signed)
 Pt called stating that he had labs drawn on 08/04/2023 and would like to discuss with Dr. Mosetta Putt or someone from Dr. Latanya Maudlin Team the results of those labs.  Informed pt that this nurse will make Dr. Mosetta Putt and her Team aware of the pt's call.

## 2023-08-11 ENCOUNTER — Inpatient Hospital Stay (HOSPITAL_BASED_OUTPATIENT_CLINIC_OR_DEPARTMENT_OTHER): Admitting: Nurse Practitioner

## 2023-08-11 ENCOUNTER — Encounter: Payer: Self-pay | Admitting: Nurse Practitioner

## 2023-08-11 ENCOUNTER — Telehealth: Payer: Self-pay

## 2023-08-11 DIAGNOSIS — D472 Monoclonal gammopathy: Secondary | ICD-10-CM

## 2023-08-11 DIAGNOSIS — D473 Essential (hemorrhagic) thrombocythemia: Secondary | ICD-10-CM

## 2023-08-11 NOTE — Progress Notes (Signed)
 Patient Care Team: Georgann Housekeeper, MD as PCP - General (Internal Medicine) Exie Parody, MD (Hematology and Oncology) Charolett Bumpers, MD as Attending Physician (Gastroenterology) Barron Alvine, MD (Inactive) as Attending Physician (Urology)   I connected with Blaine Hamper on 08/11/23 at 10:30 AM EDT by telephone visit and verified that I am speaking with the correct person using two identifiers.   I discussed the limitations, risks, security and privacy concerns of performing an evaluation and management service by telemedicine and the availability of in-person appointments. I also discussed with the patient that there may be a patient responsible charge related to this service. The patient expressed understanding and agreed to proceed.   Other persons participating in the visit and their role in the encounter: None   Patient's location: Home  Provider's location: [facility] office    CHIEF COMPLAINT: Follow up ET and MGUS  CURRENT THERAPY: Hydrea, 500 mg MWF and 1000 mg other days  INTERVAL HISTORY Mr. Whitefield presents for phone visit to review labs. Doing well without significant changes. Chronically fatigued but functional. Eating/drinking. Denies new pain, recent infection, signs of thrombosis, or bleeding. Initially said he's taking hydrea 1500 mg MWF and 500 mg other days, then confirmed he's taking it as written on the bottle.  ROS  All other systems reviewed and negative  Past Medical History:  Diagnosis Date   Back pain    Essential thrombocytosis (HCC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    Essential thrombocytosis (HCC) 08/26/2012   GERD (gastroesophageal reflux disease)    Hernia    Hyperlipidemia    Hypertension    Hypothyroid    Prostate cancer [facility])      Past Surgical History:  Procedure Laterality Date   BACK SURGERY     COLONOSCOPY  2003   COLONOSCOPY N/A 01/17/2013   Procedure: COLONOSCOPY;  Surgeon: Charolett Bumpers, MD;  Location: [facility];  Service: Endoscopy;  Laterality: N/A;   exp lap for trauma     HERNIA REPAIR     INCISION AND DRAINAGE ABSCESS ANAL  08/01/05   PROSTATE SURGERY     SHOULDER SURGERY     rt     Outpatient Encounter Medications as of 08/11/2023  Medication Sig   ADVAIR DISKUS 250-50 MCG/ACT AEPB Inhale 1 puff into the lungs 2 (two) times daily.   aspirin 81 MG tablet Take 81 mg by mouth daily.     atorvastatin (LIPITOR) 40 MG tablet Take 40 mg by mouth daily.     finasteride (PROSCAR) 5 MG tablet Take 1 tablet (5 mg total) by mouth daily.   hydroxyurea (HYDREA) 500 MG capsule Take 1 tabs on Monday, Wednesday and Friday and 2 tabs daily for the rest of week. May take with food to minimize GI side effects.   levothyroxine (SYNTHROID, LEVOTHROID) 25 MCG tablet Take 25 mcg by mouth daily.     losartan-hydrochlorothiazide (HYZAAR) 50-12.5 MG tablet Take 1 tablet by mouth daily.   meclizine (ANTIVERT) 25 MG tablet Take 1 tablet (25 mg total) by mouth 3 (three) times daily as needed for dizziness.   MIRALAX 17 GM/SCOOP powder Take 17 g by mouth daily.   mirtazapine (REMERON) 15 MG tablet Take 15 mg by mouth at bedtime.    pantoprazole (PROTONIX) 40 MG tablet Take 40 mg by mouth daily.     tamsulosin (FLOMAX) 0.4 MG CAPS capsule Take 0.4 mg by mouth every morning.   TIADYLT ER  240 MG 24 hr capsule Take 240 mg by mouth daily.   No facility-administered encounter medications on file as of 08/11/2023.     There were no vitals filed for this visit. There is no height or weight on file to calculate BMI.   PHYSICAL EXAM Pt appears well by phone. Voice is strong, speech is clear. No cough or conversational dyspnea    CBC    Component Value Date/Time   WBC 4.2 08/04/2023 1035   WBC 5.7 06/04/2021 0911   RBC 4.03 (L) 08/04/2023 1035   HGB 12.0 (L) 08/04/2023 1035   HGB 11.5 (L) 04/19/2017 0915   HCT 38.4 (L) 08/04/2023 1035   HCT 31.9 (L) 12/06/2017 1249   HCT 35.9 (L)  04/19/2017 0915   PLT  08/04/2023 1035    PLATELET CLUMPS NOTED ON SMEAR, COUNT APPEARS ADEQUATE   PLT 321 04/19/2017 0915   MCV 95.3 08/04/2023 1035   MCV 88.1 04/19/2017 0915   MCH 29.8 08/04/2023 1035   MCHC 31.3 08/04/2023 1035   RDW 14.5 08/04/2023 1035   RDW 16.3 (H) 04/19/2017 0915   LYMPHSABS 1.4 08/04/2023 1035   LYMPHSABS 1.4 04/19/2017 0915   MONOABS 0.3 08/04/2023 1035   MONOABS 0.4 04/19/2017 0915   EOSABS 0.2 08/04/2023 1035   EOSABS 0.2 04/19/2017 0915   BASOSABS 0.1 08/04/2023 1035   BASOSABS 0.0 04/19/2017 0915     CMP     Component Value Date/Time   NA 137 08/04/2023 1035   NA 140 04/19/2017 0915   K 4.7 08/04/2023 1035   K 3.6 04/19/2017 0915   CL 105 08/04/2023 1035   CL 106 08/10/2012 1509   CO2 24 08/04/2023 1035   CO2 24 04/19/2017 0915   GLUCOSE 96 08/04/2023 1035   GLUCOSE 99 04/19/2017 0915   GLUCOSE 106 (H) 08/10/2012 1509   BUN 19 08/04/2023 1035   BUN 12.5 04/19/2017 0915   CREATININE 1.55 (H) 08/04/2023 1035   CREATININE 1.1 04/19/2017 0915   CALCIUM 9.2 08/04/2023 1035   CALCIUM 9.0 04/19/2017 0915   PROT 7.3 08/04/2023 1035   PROT 7.1 04/19/2017 0915   ALBUMIN 4.3 08/04/2023 1035   ALBUMIN 3.6 04/19/2017 0915   AST 19 08/04/2023 1035   AST 17 04/19/2017 0915   ALT 8 08/04/2023 1035   ALT 9 04/19/2017 0915   ALKPHOS 55 08/04/2023 1035   ALKPHOS 53 04/19/2017 0915   BILITOT 0.6 08/04/2023 1035   BILITOT 0.54 04/19/2017 0915   GFRNONAA 43 (L) 08/04/2023 1035   GFRAA 56 (L) 01/18/2020 0825     ASSESSMENT & PLAN: 89 y.o. male with    1. Essential Thrombocytosis, JAK2(+) -Diagnosed in 07/2012, initial platelet count in 500K range; JAK2(+) -MPN with small percentage of evolving to myelofibrosis or AML in the future. No evidence of that  -Did not respond to Anagrelide.  -Currently on Hydrea titrated up to maintain plt count <500K   2. MGUS -M spike 0.2 with IgG monoclonal protein with lambda light chain specificity on 12/06/17.    -Risk of evolving to MM at ~1% per year. No evidence of that  -bone survey 01/24/2018 negative for lytic lesions   -Light chains remain stable, no M spike detected    Disposition:  Mr. Augello appears stable, tolerating Hydrea with no significant toxicities. Labs are stable. He will continue the current  dose, does not need a refill. We will repeat lab q3 months and see him back in 6 months.  I discussed the assessment and treatment plan with the patient. The patient was provided an opportunity to ask questions and all were answered. The patient agreed with the plan and demonstrated an understanding of the instructions.   The patient was advised to call back or seek an in-person evaluation if the symptoms worsen or if the condition fails to improve as anticipated. The total time spent in the appointment was 10 minutes and more than 50% was on counseling, review of test results, and coordination of care.   Santiago Glad, NP-C 08/11/2023

## 2023-08-11 NOTE — Telephone Encounter (Signed)
 Contacted patient in regards to a video visit scheduled today at 1030, per provider would like to move appointment to 12-1 today. Agreed, no further questions.

## 2023-08-12 ENCOUNTER — Telehealth: Payer: Self-pay | Admitting: Nurse Practitioner

## 2023-08-12 NOTE — Telephone Encounter (Signed)
 Per scheduling orders on 08/11/2023 patient is aware of scheduled appointment times/dates; patient has scheduling line callback number if needing to cancel or reschedule any appointments

## 2023-08-18 ENCOUNTER — Inpatient Hospital Stay: Payer: Medicare HMO | Admitting: Hematology

## 2023-09-14 DIAGNOSIS — R972 Elevated prostate specific antigen [PSA]: Secondary | ICD-10-CM | POA: Diagnosis not present

## 2023-10-08 DIAGNOSIS — C61 Malignant neoplasm of prostate: Secondary | ICD-10-CM | POA: Diagnosis not present

## 2023-10-08 DIAGNOSIS — R972 Elevated prostate specific antigen [PSA]: Secondary | ICD-10-CM | POA: Diagnosis not present

## 2023-10-20 DIAGNOSIS — E782 Mixed hyperlipidemia: Secondary | ICD-10-CM | POA: Diagnosis not present

## 2023-10-20 DIAGNOSIS — C61 Malignant neoplasm of prostate: Secondary | ICD-10-CM | POA: Diagnosis not present

## 2023-10-20 DIAGNOSIS — Z1331 Encounter for screening for depression: Secondary | ICD-10-CM | POA: Diagnosis not present

## 2023-10-20 DIAGNOSIS — E039 Hypothyroidism, unspecified: Secondary | ICD-10-CM | POA: Diagnosis not present

## 2023-10-20 DIAGNOSIS — D473 Essential (hemorrhagic) thrombocythemia: Secondary | ICD-10-CM | POA: Diagnosis not present

## 2023-10-20 DIAGNOSIS — Z23 Encounter for immunization: Secondary | ICD-10-CM | POA: Diagnosis not present

## 2023-10-20 DIAGNOSIS — K219 Gastro-esophageal reflux disease without esophagitis: Secondary | ICD-10-CM | POA: Diagnosis not present

## 2023-10-20 DIAGNOSIS — N400 Enlarged prostate without lower urinary tract symptoms: Secondary | ICD-10-CM | POA: Diagnosis not present

## 2023-10-20 DIAGNOSIS — N1831 Chronic kidney disease, stage 3a: Secondary | ICD-10-CM | POA: Diagnosis not present

## 2023-10-20 DIAGNOSIS — I10 Essential (primary) hypertension: Secondary | ICD-10-CM | POA: Diagnosis not present

## 2023-10-20 DIAGNOSIS — Z0000 Encounter for general adult medical examination without abnormal findings: Secondary | ICD-10-CM | POA: Diagnosis not present

## 2023-11-19 DIAGNOSIS — N1831 Chronic kidney disease, stage 3a: Secondary | ICD-10-CM | POA: Diagnosis not present

## 2023-12-13 ENCOUNTER — Inpatient Hospital Stay

## 2023-12-13 ENCOUNTER — Other Ambulatory Visit

## 2023-12-15 ENCOUNTER — Telehealth: Payer: Self-pay | Admitting: Hematology

## 2023-12-15 NOTE — Telephone Encounter (Signed)
 Received a voicemail from the patient asking to reschedule the canceled lab only appointment. Left the patient a voicemail with the rescheduled appointment details.

## 2023-12-17 ENCOUNTER — Inpatient Hospital Stay: Attending: Nurse Practitioner

## 2023-12-17 DIAGNOSIS — D473 Essential (hemorrhagic) thrombocythemia: Secondary | ICD-10-CM | POA: Diagnosis not present

## 2023-12-17 DIAGNOSIS — D472 Monoclonal gammopathy: Secondary | ICD-10-CM

## 2023-12-17 DIAGNOSIS — D649 Anemia, unspecified: Secondary | ICD-10-CM

## 2023-12-17 DIAGNOSIS — D509 Iron deficiency anemia, unspecified: Secondary | ICD-10-CM

## 2023-12-17 LAB — CMP (CANCER CENTER ONLY)
ALT: 8 U/L (ref 0–44)
AST: 18 U/L (ref 15–41)
Albumin: 4 g/dL (ref 3.5–5.0)
Alkaline Phosphatase: 59 U/L (ref 38–126)
Anion gap: 7 (ref 5–15)
BUN: 24 mg/dL — ABNORMAL HIGH (ref 8–23)
CO2: 23 mmol/L (ref 22–32)
Calcium: 9.3 mg/dL (ref 8.9–10.3)
Chloride: 106 mmol/L (ref 98–111)
Creatinine: 1.7 mg/dL — ABNORMAL HIGH (ref 0.61–1.24)
GFR, Estimated: 38 mL/min — ABNORMAL LOW (ref >60)
Glucose, Bld: 89 mg/dL (ref 70–99)
Potassium: 4.1 mmol/L (ref 3.5–5.1)
Sodium: 136 mmol/L (ref 135–145)
Total Bilirubin: 0.5 mg/dL (ref 0.0–1.2)
Total Protein: 7.2 g/dL (ref 6.5–8.1)

## 2023-12-17 LAB — CBC WITH DIFFERENTIAL (CANCER CENTER ONLY)
Abs Immature Granulocytes: 0.01 K/uL (ref 0.00–0.07)
Basophils Absolute: 0.1 K/uL (ref 0.0–0.1)
Basophils Relative: 2 %
Eosinophils Absolute: 0.1 K/uL (ref 0.0–0.5)
Eosinophils Relative: 3 %
HCT: 35.2 % — ABNORMAL LOW (ref 39.0–52.0)
Hemoglobin: 11.6 g/dL — ABNORMAL LOW (ref 13.0–17.0)
Immature Granulocytes: 0 %
Lymphocytes Relative: 29 %
Lymphs Abs: 1.3 K/uL (ref 0.7–4.0)
MCH: 30.4 pg (ref 26.0–34.0)
MCHC: 33 g/dL (ref 30.0–36.0)
MCV: 92.1 fL (ref 80.0–100.0)
Monocytes Absolute: 0.4 K/uL (ref 0.1–1.0)
Monocytes Relative: 9 %
Neutro Abs: 2.5 K/uL (ref 1.7–7.7)
Neutrophils Relative %: 57 %
Platelet Count: 400 K/uL (ref 150–400)
RBC: 3.82 MIL/uL — ABNORMAL LOW (ref 4.22–5.81)
RDW: 15.1 % (ref 11.5–15.5)
WBC Count: 4.5 K/uL (ref 4.0–10.5)
nRBC: 0 % (ref 0.0–0.2)

## 2023-12-17 LAB — FERRITIN: Ferritin: 31 ng/mL (ref 24–336)

## 2023-12-20 LAB — PROTEIN ELECTROPHORESIS, SERUM, WITH REFLEX
A/G Ratio: 1.3 (ref 0.7–1.7)
Albumin ELP: 3.8 g/dL (ref 2.9–4.4)
Alpha-1-Globulin: 0.2 g/dL (ref 0.0–0.4)
Alpha-2-Globulin: 0.8 g/dL (ref 0.4–1.0)
Beta Globulin: 0.9 g/dL (ref 0.7–1.3)
Gamma Globulin: 1.1 g/dL (ref 0.4–1.8)
Globulin, Total: 3 g/dL (ref 2.2–3.9)
Total Protein ELP: 6.8 g/dL (ref 6.0–8.5)

## 2023-12-20 LAB — KAPPA/LAMBDA LIGHT CHAINS
Kappa free light chain: 49.8 mg/L — ABNORMAL HIGH (ref 3.3–19.4)
Kappa, lambda light chain ratio: 1.72 — ABNORMAL HIGH (ref 0.26–1.65)
Lambda free light chains: 29 mg/L — ABNORMAL HIGH (ref 5.7–26.3)

## 2023-12-28 ENCOUNTER — Ambulatory Visit: Payer: Self-pay | Admitting: Nurse Practitioner

## 2023-12-28 NOTE — Telephone Encounter (Signed)
 Called patient to relay Lacie's message and the patient was pleased with the messaging that all is going well. Andrea CHRISTELLA Plunk, RN

## 2024-02-14 ENCOUNTER — Other Ambulatory Visit: Payer: Self-pay | Admitting: Nurse Practitioner

## 2024-02-14 DIAGNOSIS — D473 Essential (hemorrhagic) thrombocythemia: Secondary | ICD-10-CM

## 2024-02-14 DIAGNOSIS — D472 Monoclonal gammopathy: Secondary | ICD-10-CM

## 2024-02-14 NOTE — Assessment & Plan Note (Addendum)
-  M spike 0.2 with IgG monoclonal protein with lambda light chain specificity on 12/06/17.  -Cr on 10/11/17 was elevated to 1.34, normal previously; has normalized on 02/14/18 -01/24/18 bone survey is negative for lytic lesions  -Last M-Protein in 05/2022 was not observed. Light chain was stable. Will continue monitoring MM panel every 6 months.  -02/15/2024 - Persistent CKD.  Elevated kappa lambda light chains with M protein spike not observed for myeloma panel. -Repeat lab in 3 and 6 months with follow up in 6 months.

## 2024-02-14 NOTE — Assessment & Plan Note (Addendum)
 He was diagnosed in March 2014, initial platelet count in the 500k range, JAK2 mutation (+), he did not have bone marrow biopsy.  -We previously discussed that this is a myeloproliferative disorder, people usually do very well, and there is small percentage pts will develop myelofibrosis and AML late on.  -he initially started Hydrea  in 08/2012, and his dose has been adjusted as needed. He tried anagrelide  for one month, but this was not effective. He is currently on Hydrea  1000 mg MWF and 500 mg other days. He is tolerating well.  -Lab reviewed, hemoglobin 12.1, platelet count 615.  Taking Hydrea  1000 mg 4 days weekly and 500 mg on all other days.  Mild anemia today with ferritin 54.  Persistent CKD.  Elevated kappa lambda light chains with M protein spike not observed for myeloma panel. -Repeat lab in 3 and 6 months with follow up in 6 months.

## 2024-02-14 NOTE — Progress Notes (Signed)
 Patient Care Team: Ransom Other, MD as PCP - General (Internal Medicine) Twana Jeneal DASEN, MD (Hematology and Oncology) Vicci Gladis POUR, MD as Attending Physician (Gastroenterology) Alline Lenis, MD (Inactive) as Attending Physician (Urology)  Clinic Day:  02/15/2024  Referring physician: Ransom Other, MD  ASSESSMENT & PLAN:   Assessment & Plan: Essential thrombocytosis [facility]) He was diagnosed in March 2014, initial platelet count in the 500k range, JAK2 mutation (+), he did not have bone marrow biopsy.  -We previously discussed that this is a myeloproliferative disorder, people usually do very well, and there is small percentage pts will develop myelofibrosis and AML late on.  -he initially started Hydrea  in 08/2012, and his dose has been adjusted as needed. He tried anagrelide  for one month, but this was not effective. He is currently on Hydrea  1000 mg MWF and 500 mg other days. He is tolerating well.  -Lab reviewed, hemoglobin 12.1, platelet count 615.  Taking Hydrea  1000 mg 4 days weekly and 500 mg on all other days.  Mild anemia today with ferritin 54.  Persistent CKD.  Elevated kappa lambda light chains with M protein spike not observed for myeloma panel. -Repeat lab in 3 and 6 months with follow up in 6 months.   MGUS (monoclonal gammopathy of unknown significance) -M spike 0.2 with IgG monoclonal protein with lambda light chain specificity on 12/06/17.  -Cr on 10/11/17 was elevated to 1.34, normal previously; has normalized on 02/14/18 -01/24/18 bone survey is negative for lytic lesions  -Last M-Protein in 05/2022 was not observed. Light chain was stable. Will continue monitoring MM panel every 6 months.  -02/15/2024 - Persistent CKD.  Elevated kappa lambda light chains with M protein spike not observed for myeloma panel. -Repeat lab in 3 and 6 months with follow up in 6 months.      Elevated serum light chains Kappa and lambda free light chains increase slightly from prior check.   Myeloma panel indicates M protein spike not observed.  Will continue to monitor closely.  He does have history of CKD and sees nephrology on a regular basis.  Essential thrombocytosis Patient taking Hydrea  differently than prescription per instructions at most recent visit.  He is taking 2 tablets (1000 mg) on Monday, Wednesday, Friday, and Saturday.  He is taking 1 tablet (500 mg) on Tuesday, Thursday, and Sunday.  Platelet count is elevated slightly today at 615.  Mild anemia with Hgb 11.7 and HCT 36.2.  Ferritin within normal limits at 54.  Will continue Hydrea  with current dosing regimen.  New prescription to be sent to his pharmacy.  Plan Labs reviewed. - Mildly elevated platelet count at 615.  Slight anemia with Hgb 11.7 and HCT 36.2.  Ferritin normal at 54. - Chronic CKD.  Continues regular visits with a nephrologist. - Kappa and lambda light chains slightly elevated.  M protein spike not observed on myeloma panel. Continue with Hydrea  at current dosing regimen.  New prescription sent to pharmacy today. Plan to recheck labs in 3 months and adjust dosing as indicated. Labs and follow-up in 6 months.  The patient understands the plans discussed today and is in agreement with them.  He knows to contact our office if he develops concerns prior to his next appointment.  I provided 20 minutes of face-to-face time during this encounter and > 50% was spent counseling as documented under my assessment and plan.    Powell FORBES Lessen, NP  [facility] -  A DEPT OF [facility] [address] [place] [address] Dept: [phone] Dept Fax: [phone]   No orders of the defined types were placed in this encounter.     CHIEF COMPLAINT:  CC: Essential thrombocytosis; MGUS  Current Treatment: Hydrea  500 mg on MWF and 1000 mg all other days.  INTERVAL HISTORY:  Ricky Santos is here today for repeat clinical assessment. He denies  fevers or chills.  He last saw Dr. Lanny on 08/06/2023.  Most recent labs done 12/17/2023.  Recheck of labs today shows mild elevation of platelets at 615 with slight anemia.  Ferritin normal at 54.  Persistent CKD.  Patient is followed by nephrology.  Elevated kappa lambda light chains with M protein spike not observed on myeloma panel.  Patient reports doing well with no negative side effects from taking Hydrea . He denies chest pain, chest pressure, or shortness of breath. He denies headaches or visual disturbances. He denies abdominal pain, nausea, vomiting, or changes in bowel or bladder habits.   Denies pain. His appetite is good. His weight has been stable.  I have reviewed the past medical history, past surgical history, social history and family history with the patient and they are unchanged from previous note.  ALLERGIES:  has no known allergies.  MEDICATIONS:  Current Outpatient Medications  Medication Sig Dispense Refill   ADVAIR DISKUS 250-50 MCG/ACT AEPB Inhale 1 puff into the lungs 2 (two) times daily.     aspirin 81 MG tablet Take 81 mg by mouth daily.       atorvastatin  (LIPITOR) 40 MG tablet Take 40 mg by mouth daily.       finasteride  (PROSCAR ) 5 MG tablet Take 1 tablet (5 mg total) by mouth daily. 90 tablet 3   levothyroxine  (SYNTHROID , LEVOTHROID) 25 MCG tablet Take 25 mcg by mouth daily.       losartan-hydrochlorothiazide  (HYZAAR) 50-12.5 MG tablet Take 1 tablet by mouth daily.     meclizine  (ANTIVERT ) 25 MG tablet Take 1 tablet (25 mg total) by mouth 3 (three) times daily as needed for dizziness. 30 tablet 0   MIRALAX  17 GM/SCOOP powder Take 17 g by mouth daily.     mirtazapine  (REMERON ) 15 MG tablet Take 15 mg by mouth at bedtime.      pantoprazole  (PROTONIX ) 40 MG tablet Take 40 mg by mouth daily.       tamsulosin  (FLOMAX ) 0.4 MG CAPS capsule Take 0.4 mg by mouth every morning.     TIADYLT  ER 240 MG 24 hr capsule Take 240 mg by mouth daily.     hydroxyurea  (HYDREA ) 500 MG  capsule Take 1 tabs on Monday, Wednesday, Friday, and Saturday. Take 2 tabs daily on Tuesday, Thursday, and Sunday.. May take with food to minimize GI side effects. 154 capsule 1   No current facility-administered medications for this visit.      REVIEW OF SYSTEMS:   Constitutional: Denies fevers, chills or abnormal weight loss Eyes: Denies blurriness of vision Ears, nose, mouth, throat, and face: Denies mucositis or sore throat Respiratory: Denies cough, dyspnea or wheezes Cardiovascular: Denies palpitation, chest discomfort or lower extremity swelling Gastrointestinal:  Denies nausea, heartburn or change in bowel habits Skin: Denies abnormal skin rashes Lymphatics: Denies new lymphadenopathy or easy bruising Neurological:Denies numbness, tingling or new weaknesses Behavioral/Psych: Mood is stable, no new changes  All other systems were reviewed with the patient and are negative.   VITALS:   Today's Vitals   02/15/24 1000 02/15/24  1050  BP: (!) 154/84 (!) 140/76  Pulse: 68   Resp: 16   Temp: (!) 97.5 F (36.4 C)   TempSrc: Temporal   SpO2: 100%   Weight: 155 lb 6.4 oz (70.5 kg)   PainSc: 0-No pain    Body mass index is 22.95 kg/m.   Wt Readings from Last 3 Encounters:  02/15/24 155 lb 6.4 oz (70.5 kg)  02/03/23 155 lb 12.8 oz (70.7 kg)  08/06/22 162 lb 4.8 oz (73.6 kg)    Body mass index is 22.95 kg/m.  Performance status (ECOG): 0 - Asymptomatic  PHYSICAL EXAM:   GENERAL:alert, no distress and comfortable SKIN: skin color, texture, turgor are normal, no rashes or significant lesions EYES: normal, Conjunctiva are pink and non-injected, sclera clear OROPHARYNX:no exudate, no erythema and lips, buccal mucosa, and tongue normal  NECK: supple, thyroid  normal size, non-tender, without nodularity LYMPH:  no palpable lymphadenopathy in the cervical, axillary or inguinal LUNGS: clear to auscultation and percussion with normal breathing effort HEART: regular rate &  rhythm and no murmurs and no lower extremity edema ABDOMEN:abdomen soft, non-tender and normal bowel sounds Musculoskeletal:no cyanosis of digits and no clubbing  NEURO: alert & oriented x 3 with fluent speech, no focal motor/sensory deficits  LABORATORY DATA:  I have reviewed the data as listed    Component Value Date/Time   NA 136 02/15/2024 0938   NA 140 04/19/2017 0915   K 4.1 02/15/2024 0938   K 3.6 04/19/2017 0915   CL 106 02/15/2024 0938   CL 106 08/10/2012 1509   CO2 22 02/15/2024 0938   CO2 24 04/19/2017 0915   GLUCOSE 98 02/15/2024 0938   GLUCOSE 99 04/19/2017 0915   GLUCOSE 106 (H) 08/10/2012 1509   BUN 23 02/15/2024 0938   BUN 12.5 04/19/2017 0915   CREATININE 1.60 (H) 02/15/2024 0938   CREATININE 1.1 04/19/2017 0915   CALCIUM 9.4 02/15/2024 0938   CALCIUM 9.0 04/19/2017 0915   PROT 7.4 02/15/2024 0938   PROT 7.1 04/19/2017 0915   ALBUMIN 4.3 02/15/2024 0938   ALBUMIN 3.6 04/19/2017 0915   AST 20 02/15/2024 0938   AST 17 04/19/2017 0915   ALT 10 02/15/2024 0938   ALT 9 04/19/2017 0915   ALKPHOS 56 02/15/2024 0938   ALKPHOS 53 04/19/2017 0915   BILITOT 0.4 02/15/2024 0938   BILITOT 0.54 04/19/2017 0915   GFRNONAA 41 (L) 02/15/2024 0938   GFRAA 56 (L) 01/18/2020 0825    Lab Results  Component Value Date   SPEP Comment 12/17/2023    Lab Results  Component Value Date   WBC 5.0 02/15/2024   NEUTROABS 2.7 02/15/2024   HGB 11.7 (L) 02/15/2024   HCT 36.2 (L) 02/15/2024   MCV 93.3 02/15/2024   PLT 615 (H) 02/15/2024

## 2024-02-15 ENCOUNTER — Other Ambulatory Visit: Payer: Self-pay | Admitting: Nurse Practitioner

## 2024-02-15 ENCOUNTER — Inpatient Hospital Stay: Attending: Nurse Practitioner

## 2024-02-15 ENCOUNTER — Other Ambulatory Visit: Payer: Self-pay

## 2024-02-15 ENCOUNTER — Inpatient Hospital Stay (HOSPITAL_BASED_OUTPATIENT_CLINIC_OR_DEPARTMENT_OTHER): Admitting: Nurse Practitioner

## 2024-02-15 VITALS — BP 140/76 | HR 68 | Temp 97.5°F | Resp 16 | Wt 155.4 lb

## 2024-02-15 DIAGNOSIS — Z79899 Other long term (current) drug therapy: Secondary | ICD-10-CM | POA: Insufficient documentation

## 2024-02-15 DIAGNOSIS — D472 Monoclonal gammopathy: Secondary | ICD-10-CM

## 2024-02-15 DIAGNOSIS — D649 Anemia, unspecified: Secondary | ICD-10-CM | POA: Diagnosis not present

## 2024-02-15 DIAGNOSIS — Z7982 Long term (current) use of aspirin: Secondary | ICD-10-CM | POA: Diagnosis not present

## 2024-02-15 DIAGNOSIS — D473 Essential (hemorrhagic) thrombocythemia: Secondary | ICD-10-CM

## 2024-02-15 DIAGNOSIS — Z7964 Long term (current) use of myelosuppressive agent: Secondary | ICD-10-CM | POA: Diagnosis not present

## 2024-02-15 DIAGNOSIS — Z79890 Hormone replacement therapy (postmenopausal): Secondary | ICD-10-CM | POA: Insufficient documentation

## 2024-02-15 DIAGNOSIS — N189 Chronic kidney disease, unspecified: Secondary | ICD-10-CM | POA: Insufficient documentation

## 2024-02-15 LAB — CMP (CANCER CENTER ONLY)
ALT: 10 U/L (ref 0–44)
AST: 20 U/L (ref 15–41)
Albumin: 4.3 g/dL (ref 3.5–5.0)
Alkaline Phosphatase: 56 U/L (ref 38–126)
Anion gap: 8 (ref 5–15)
BUN: 23 mg/dL (ref 8–23)
CO2: 22 mmol/L (ref 22–32)
Calcium: 9.4 mg/dL (ref 8.9–10.3)
Chloride: 106 mmol/L (ref 98–111)
Creatinine: 1.6 mg/dL — ABNORMAL HIGH (ref 0.61–1.24)
GFR, Estimated: 41 mL/min — ABNORMAL LOW (ref >60)
Glucose, Bld: 98 mg/dL (ref 70–99)
Potassium: 4.1 mmol/L (ref 3.5–5.1)
Sodium: 136 mmol/L (ref 135–145)
Total Bilirubin: 0.4 mg/dL (ref 0.0–1.2)
Total Protein: 7.4 g/dL (ref 6.5–8.1)

## 2024-02-15 LAB — CBC WITH DIFFERENTIAL (CANCER CENTER ONLY)
Abs Immature Granulocytes: 0.04 K/uL (ref 0.00–0.07)
Basophils Absolute: 0.1 K/uL (ref 0.0–0.1)
Basophils Relative: 2 %
Eosinophils Absolute: 0.1 K/uL (ref 0.0–0.5)
Eosinophils Relative: 3 %
HCT: 36.2 % — ABNORMAL LOW (ref 39.0–52.0)
Hemoglobin: 11.7 g/dL — ABNORMAL LOW (ref 13.0–17.0)
Immature Granulocytes: 1 %
Lymphocytes Relative: 31 %
Lymphs Abs: 1.6 K/uL (ref 0.7–4.0)
MCH: 30.2 pg (ref 26.0–34.0)
MCHC: 32.3 g/dL (ref 30.0–36.0)
MCV: 93.3 fL (ref 80.0–100.0)
Monocytes Absolute: 0.4 K/uL (ref 0.1–1.0)
Monocytes Relative: 8 %
Neutro Abs: 2.7 K/uL (ref 1.7–7.7)
Neutrophils Relative %: 55 %
Platelet Count: 615 K/uL — ABNORMAL HIGH (ref 150–400)
RBC: 3.88 MIL/uL — ABNORMAL LOW (ref 4.22–5.81)
RDW: 15.5 % (ref 11.5–15.5)
WBC Count: 5 K/uL (ref 4.0–10.5)
nRBC: 0 % (ref 0.0–0.2)

## 2024-02-15 LAB — FERRITIN: Ferritin: 54 ng/mL (ref 24–336)

## 2024-02-16 LAB — KAPPA/LAMBDA LIGHT CHAINS
Kappa free light chain: 58 mg/L — ABNORMAL HIGH (ref 3.3–19.4)
Kappa, lambda light chain ratio: 1.66 — ABNORMAL HIGH (ref 0.26–1.65)
Lambda free light chains: 35 mg/L — ABNORMAL HIGH (ref 5.7–26.3)

## 2024-02-18 LAB — MULTIPLE MYELOMA PANEL, SERUM
Albumin SerPl Elph-Mcnc: 3.9 g/dL (ref 2.9–4.4)
Albumin/Glob SerPl: 1.3 (ref 0.7–1.7)
Alpha 1: 0.3 g/dL (ref 0.0–0.4)
Alpha2 Glob SerPl Elph-Mcnc: 0.8 g/dL (ref 0.4–1.0)
B-Globulin SerPl Elph-Mcnc: 1 g/dL (ref 0.7–1.3)
Gamma Glob SerPl Elph-Mcnc: 1.2 g/dL (ref 0.4–1.8)
Globulin, Total: 3.2 g/dL (ref 2.2–3.9)
IgA: 166 mg/dL (ref 61–437)
IgG (Immunoglobin G), Serum: 1238 mg/dL (ref 603–1613)
IgM (Immunoglobulin M), Srm: 74 mg/dL (ref 15–143)
Total Protein ELP: 7.1 g/dL (ref 6.0–8.5)

## 2024-02-22 ENCOUNTER — Encounter: Payer: Self-pay | Admitting: Nurse Practitioner

## 2024-02-22 MED ORDER — HYDROXYUREA 500 MG PO CAPS: ORAL_CAPSULE | ORAL | 1 refills | Status: DC

## 2024-04-18 DIAGNOSIS — C61 Malignant neoplasm of prostate: Secondary | ICD-10-CM | POA: Diagnosis not present

## 2024-04-19 DIAGNOSIS — G629 Polyneuropathy, unspecified: Secondary | ICD-10-CM | POA: Diagnosis not present

## 2024-04-19 DIAGNOSIS — I10 Essential (primary) hypertension: Secondary | ICD-10-CM | POA: Diagnosis not present

## 2024-04-19 DIAGNOSIS — D509 Iron deficiency anemia, unspecified: Secondary | ICD-10-CM | POA: Diagnosis not present

## 2024-04-19 DIAGNOSIS — D472 Monoclonal gammopathy: Secondary | ICD-10-CM | POA: Diagnosis not present

## 2024-04-19 DIAGNOSIS — J440 Chronic obstructive pulmonary disease with acute lower respiratory infection: Secondary | ICD-10-CM | POA: Diagnosis not present

## 2024-04-19 DIAGNOSIS — D473 Essential (hemorrhagic) thrombocythemia: Secondary | ICD-10-CM | POA: Diagnosis not present

## 2024-04-19 DIAGNOSIS — E782 Mixed hyperlipidemia: Secondary | ICD-10-CM | POA: Diagnosis not present

## 2024-04-19 DIAGNOSIS — C61 Malignant neoplasm of prostate: Secondary | ICD-10-CM | POA: Diagnosis not present

## 2024-04-19 DIAGNOSIS — N1831 Chronic kidney disease, stage 3a: Secondary | ICD-10-CM | POA: Diagnosis not present

## 2024-04-25 DIAGNOSIS — C61 Malignant neoplasm of prostate: Secondary | ICD-10-CM | POA: Diagnosis not present

## 2024-05-02 ENCOUNTER — Other Ambulatory Visit (HOSPITAL_COMMUNITY): Payer: Self-pay | Admitting: Adult Health

## 2024-05-02 DIAGNOSIS — C61 Malignant neoplasm of prostate: Secondary | ICD-10-CM

## 2024-05-09 ENCOUNTER — Inpatient Hospital Stay

## 2024-05-09 ENCOUNTER — Inpatient Hospital Stay: Attending: Nurse Practitioner

## 2024-05-09 DIAGNOSIS — D473 Essential (hemorrhagic) thrombocythemia: Secondary | ICD-10-CM | POA: Diagnosis present

## 2024-05-09 DIAGNOSIS — D472 Monoclonal gammopathy: Secondary | ICD-10-CM

## 2024-05-09 LAB — CBC WITH DIFFERENTIAL (CANCER CENTER ONLY)
Abs Immature Granulocytes: 0.01 K/uL (ref 0.00–0.07)
Basophils Absolute: 0.1 K/uL (ref 0.0–0.1)
Basophils Relative: 2 %
Eosinophils Absolute: 0.1 K/uL (ref 0.0–0.5)
Eosinophils Relative: 2 %
HCT: 36.5 % — ABNORMAL LOW (ref 39.0–52.0)
Hemoglobin: 11.9 g/dL — ABNORMAL LOW (ref 13.0–17.0)
Immature Granulocytes: 0 %
Lymphocytes Relative: 36 %
Lymphs Abs: 1.8 K/uL (ref 0.7–4.0)
MCH: 31.2 pg (ref 26.0–34.0)
MCHC: 32.6 g/dL (ref 30.0–36.0)
MCV: 95.5 fL (ref 80.0–100.0)
Monocytes Absolute: 0.4 K/uL (ref 0.1–1.0)
Monocytes Relative: 9 %
Neutro Abs: 2.5 K/uL (ref 1.7–7.7)
Neutrophils Relative %: 51 %
Platelet Count: 344 K/uL (ref 150–400)
RBC: 3.82 MIL/uL — ABNORMAL LOW (ref 4.22–5.81)
RDW: 15.2 % (ref 11.5–15.5)
WBC Count: 4.9 K/uL (ref 4.0–10.5)
nRBC: 0 % (ref 0.0–0.2)

## 2024-05-09 LAB — CMP (CANCER CENTER ONLY)
ALT: 7 U/L (ref 0–44)
AST: 26 U/L (ref 15–41)
Albumin: 4.3 g/dL (ref 3.5–5.0)
Alkaline Phosphatase: 65 U/L (ref 38–126)
Anion gap: 11 (ref 5–15)
BUN: 17 mg/dL (ref 8–23)
CO2: 22 mmol/L (ref 22–32)
Calcium: 9.7 mg/dL (ref 8.9–10.3)
Chloride: 103 mmol/L (ref 98–111)
Creatinine: 1.46 mg/dL — ABNORMAL HIGH (ref 0.61–1.24)
GFR, Estimated: 46 mL/min — ABNORMAL LOW (ref >60)
Glucose, Bld: 85 mg/dL (ref 70–99)
Potassium: 4.5 mmol/L (ref 3.5–5.1)
Sodium: 136 mmol/L (ref 135–145)
Total Bilirubin: 0.5 mg/dL (ref 0.0–1.2)
Total Protein: 7.7 g/dL (ref 6.5–8.1)

## 2024-05-09 LAB — FERRITIN: Ferritin: 55 ng/mL (ref 24–336)

## 2024-05-10 LAB — KAPPA/LAMBDA LIGHT CHAINS
Kappa free light chain: 52.4 mg/L — ABNORMAL HIGH (ref 3.3–19.4)
Kappa, lambda light chain ratio: 1.47 (ref 0.26–1.65)
Lambda free light chains: 35.6 mg/L — ABNORMAL HIGH (ref 5.7–26.3)

## 2024-05-11 LAB — MULTIPLE MYELOMA PANEL, SERUM
Albumin SerPl Elph-Mcnc: 3.8 g/dL (ref 2.9–4.4)
Albumin/Glob SerPl: 1.1 (ref 0.7–1.7)
Alpha 1: 0.3 g/dL (ref 0.0–0.4)
Alpha2 Glob SerPl Elph-Mcnc: 0.9 g/dL (ref 0.4–1.0)
B-Globulin SerPl Elph-Mcnc: 1.1 g/dL (ref 0.7–1.3)
Gamma Glob SerPl Elph-Mcnc: 1.4 g/dL (ref 0.4–1.8)
Globulin, Total: 3.7 g/dL (ref 2.2–3.9)
IgA: 188 mg/dL (ref 61–437)
IgG (Immunoglobin G), Serum: 1394 mg/dL (ref 603–1613)
IgM (Immunoglobulin M), Srm: 89 mg/dL (ref 15–143)
Total Protein ELP: 7.5 g/dL (ref 6.0–8.5)

## 2024-05-12 ENCOUNTER — Encounter (HOSPITAL_COMMUNITY)
Admission: RE | Admit: 2024-05-12 | Discharge: 2024-05-12 | Disposition: A | Source: Ambulatory Visit | Attending: Adult Health | Admitting: Adult Health

## 2024-05-12 DIAGNOSIS — C61 Malignant neoplasm of prostate: Secondary | ICD-10-CM

## 2024-05-12 MED ORDER — FLOTUFOLASTAT F 18 GALLIUM 296-5846 MBQ/ML IV SOLN
7.2500 | Freq: Once | INTRAVENOUS | Status: AC
  Administered 2024-05-12: 7.25 via INTRAVENOUS

## 2024-05-16 ENCOUNTER — Ambulatory Visit: Payer: Self-pay | Admitting: Nurse Practitioner

## 2024-05-18 NOTE — Telephone Encounter (Addendum)
 As per Powell Lessen NP, called patient to relay message below, patient voiced full understanding and had no further questions at this time.    ----- Message from Powell Lessen, NP sent at 05/16/2024  3:13 PM EST ----- Hi Ricky Santos. When you get a chance, will you let the patient know that his light chains and myeloma panel are stable. We will continue to monitor. His next labs and follow up are already scheduled for 07/2024.  Thank you.  Karyle

## 2024-06-26 ENCOUNTER — Other Ambulatory Visit: Payer: Self-pay | Admitting: Hematology

## 2024-06-26 DIAGNOSIS — D473 Essential (hemorrhagic) thrombocythemia: Secondary | ICD-10-CM

## 2024-08-08 ENCOUNTER — Other Ambulatory Visit

## 2024-08-08 ENCOUNTER — Ambulatory Visit: Admitting: Hematology
